# Patient Record
Sex: Female | Born: 1935
Health system: Southern US, Community
[De-identification: ages and names within clinical notes are randomized; demographics above are authoritative.]

## PROBLEM LIST (undated history)

## (undated) DIAGNOSIS — I10 Essential (primary) hypertension: Secondary | ICD-10-CM

## (undated) DIAGNOSIS — I6522 Occlusion and stenosis of left carotid artery: Secondary | ICD-10-CM

## (undated) DIAGNOSIS — N189 Chronic kidney disease, unspecified: Secondary | ICD-10-CM

## (undated) DIAGNOSIS — D649 Anemia, unspecified: Secondary | ICD-10-CM

## (undated) HISTORY — DX: Anemia, unspecified: D64.9

## (undated) HISTORY — PX: JOINT REPLACEMENT: SHX530

## (undated) HISTORY — PX: LUMBAR FUSION: SHX111

## (undated) HISTORY — DX: Chronic kidney disease, unspecified: N18.9

## (undated) HISTORY — PX: THYROID SURGERY: SHX805

---

## 2006-01-27 ENCOUNTER — Inpatient Hospital Stay (HOSPITAL_COMMUNITY): Admission: RE | Admit: 2006-01-27 | Discharge: 2006-02-04 | Payer: Self-pay | Admitting: Neurosurgery

## 2006-01-30 ENCOUNTER — Ambulatory Visit: Payer: Self-pay | Admitting: Physical Medicine & Rehabilitation

## 2016-12-26 ENCOUNTER — Inpatient Hospital Stay (HOSPITAL_COMMUNITY): Payer: Medicare PPO

## 2016-12-26 ENCOUNTER — Inpatient Hospital Stay (HOSPITAL_COMMUNITY)
Admission: AD | Admit: 2016-12-26 | Discharge: 2017-01-02 | DRG: 481 | Disposition: A | Discharge status: Home / Self Care | Payer: Medicare PPO | Attending: Internal Medicine | Admitting: Internal Medicine

## 2016-12-26 ENCOUNTER — Encounter (HOSPITAL_COMMUNITY): Payer: Self-pay | Admitting: *Deleted

## 2016-12-26 DIAGNOSIS — Z823 Family history of stroke: Secondary | ICD-10-CM

## 2016-12-26 DIAGNOSIS — N189 Chronic kidney disease, unspecified: Secondary | ICD-10-CM

## 2016-12-26 DIAGNOSIS — M25552 Pain in left hip: Secondary | ICD-10-CM | POA: Diagnosis present

## 2016-12-26 DIAGNOSIS — D638 Anemia in other chronic diseases classified elsewhere: Secondary | ICD-10-CM | POA: Diagnosis present

## 2016-12-26 DIAGNOSIS — E860 Dehydration: Secondary | ICD-10-CM | POA: Diagnosis present

## 2016-12-26 DIAGNOSIS — Z79899 Other long term (current) drug therapy: Secondary | ICD-10-CM | POA: Diagnosis not present

## 2016-12-26 DIAGNOSIS — N390 Urinary tract infection, site not specified: Secondary | ICD-10-CM | POA: Diagnosis present

## 2016-12-26 DIAGNOSIS — D62 Acute posthemorrhagic anemia: Secondary | ICD-10-CM | POA: Diagnosis present

## 2016-12-26 DIAGNOSIS — R809 Proteinuria, unspecified: Secondary | ICD-10-CM | POA: Diagnosis present

## 2016-12-26 DIAGNOSIS — Z09 Encounter for follow-up examination after completed treatment for conditions other than malignant neoplasm: Secondary | ICD-10-CM

## 2016-12-26 DIAGNOSIS — I129 Hypertensive chronic kidney disease with stage 1 through stage 4 chronic kidney disease, or unspecified chronic kidney disease: Secondary | ICD-10-CM | POA: Diagnosis present

## 2016-12-26 DIAGNOSIS — N179 Acute kidney failure, unspecified: Secondary | ICD-10-CM | POA: Diagnosis present

## 2016-12-26 DIAGNOSIS — W1830XA Fall on same level, unspecified, initial encounter: Secondary | ICD-10-CM | POA: Diagnosis present

## 2016-12-26 DIAGNOSIS — Z8249 Family history of ischemic heart disease and other diseases of the circulatory system: Secondary | ICD-10-CM | POA: Diagnosis not present

## 2016-12-26 DIAGNOSIS — D649 Anemia, unspecified: Secondary | ICD-10-CM | POA: Diagnosis present

## 2016-12-26 DIAGNOSIS — S72142A Displaced intertrochanteric fracture of left femur, initial encounter for closed fracture: Principal | ICD-10-CM | POA: Diagnosis present

## 2016-12-26 DIAGNOSIS — B9689 Other specified bacterial agents as the cause of diseases classified elsewhere: Secondary | ICD-10-CM | POA: Diagnosis present

## 2016-12-26 DIAGNOSIS — B961 Klebsiella pneumoniae [K. pneumoniae] as the cause of diseases classified elsewhere: Secondary | ICD-10-CM | POA: Diagnosis present

## 2016-12-26 DIAGNOSIS — I10 Essential (primary) hypertension: Secondary | ICD-10-CM | POA: Diagnosis not present

## 2016-12-26 DIAGNOSIS — K5900 Constipation, unspecified: Secondary | ICD-10-CM | POA: Diagnosis not present

## 2016-12-26 DIAGNOSIS — N183 Chronic kidney disease, stage 3 (moderate): Secondary | ICD-10-CM | POA: Diagnosis not present

## 2016-12-26 DIAGNOSIS — S72142D Displaced intertrochanteric fracture of left femur, subsequent encounter for closed fracture with routine healing: Secondary | ICD-10-CM | POA: Diagnosis not present

## 2016-12-26 DIAGNOSIS — N170 Acute kidney failure with tubular necrosis: Secondary | ICD-10-CM | POA: Diagnosis not present

## 2016-12-26 DIAGNOSIS — N184 Chronic kidney disease, stage 4 (severe): Secondary | ICD-10-CM | POA: Diagnosis not present

## 2016-12-26 DIAGNOSIS — S72142P Displaced intertrochanteric fracture of left femur, subsequent encounter for closed fracture with malunion: Secondary | ICD-10-CM | POA: Diagnosis not present

## 2016-12-26 HISTORY — DX: Essential (primary) hypertension: I10

## 2016-12-26 LAB — COMPREHENSIVE METABOLIC PANEL
ALT: 19 U/L (ref 14–54)
AST: 23 U/L (ref 15–41)
Albumin: 3.5 g/dL (ref 3.5–5.0)
Alkaline Phosphatase: 67 U/L (ref 38–126)
Anion gap: 10 (ref 5–15)
BILIRUBIN TOTAL: 0.9 mg/dL (ref 0.3–1.2)
BUN: 52 mg/dL — AB (ref 6–20)
CALCIUM: 8.6 mg/dL — AB (ref 8.9–10.3)
CO2: 23 mmol/L (ref 22–32)
CREATININE: 2.31 mg/dL — AB (ref 0.44–1.00)
Chloride: 106 mmol/L (ref 101–111)
GFR calc Af Amer: 21 mL/min — ABNORMAL LOW (ref >60)
GFR, EST NON AFRICAN AMERICAN: 18 mL/min — AB (ref >60)
Glucose, Bld: 103 mg/dL — ABNORMAL HIGH (ref 65–99)
Potassium: 3.5 mmol/L (ref 3.5–5.1)
Sodium: 139 mmol/L (ref 135–145)
TOTAL PROTEIN: 6.2 g/dL — AB (ref 6.5–8.1)

## 2016-12-26 LAB — CBC
HCT: 22.3 % — ABNORMAL LOW (ref 36.0–46.0)
Hemoglobin: 7.7 g/dL — ABNORMAL LOW (ref 12.0–15.0)
MCH: 31.3 pg (ref 26.0–34.0)
MCHC: 34.5 g/dL (ref 30.0–36.0)
MCV: 90.7 fL (ref 78.0–100.0)
PLATELETS: 199 10*3/uL (ref 150–400)
RBC: 2.46 MIL/uL — AB (ref 3.87–5.11)
RDW: 13.6 % (ref 11.5–15.5)
WBC: 9.4 10*3/uL (ref 4.0–10.5)

## 2016-12-26 LAB — URINALYSIS, ROUTINE W REFLEX MICROSCOPIC
Bilirubin Urine: NEGATIVE
Glucose, UA: NEGATIVE mg/dL
Ketones, ur: NEGATIVE mg/dL
Nitrite: NEGATIVE
Protein, ur: >=300 mg/dL — AB
SPECIFIC GRAVITY, URINE: 1.014 (ref 1.005–1.030)
pH: 5 (ref 5.0–8.0)

## 2016-12-26 LAB — IRON AND TIBC
Iron: 31 ug/dL (ref 28–170)
Saturation Ratios: 12 % (ref 10.4–31.8)
TIBC: 252 ug/dL (ref 250–450)
UIBC: 221 ug/dL

## 2016-12-26 LAB — SODIUM, URINE, RANDOM: SODIUM UR: 19 mmol/L

## 2016-12-26 LAB — VITAMIN B12: VITAMIN B 12: 555 pg/mL (ref 180–914)

## 2016-12-26 LAB — MAGNESIUM: Magnesium: 2.1 mg/dL (ref 1.7–2.4)

## 2016-12-26 LAB — CREATININE, URINE, RANDOM: CREATININE, URINE: 133.37 mg/dL

## 2016-12-26 LAB — PROTIME-INR
INR: 1.04
PROTHROMBIN TIME: 13.5 s (ref 11.4–15.2)

## 2016-12-26 LAB — FERRITIN: FERRITIN: 77 ng/mL (ref 11–307)

## 2016-12-26 LAB — FOLATE: Folate: 17.8 ng/mL (ref >5.9)

## 2016-12-26 MED ORDER — SODIUM CHLORIDE 0.9% FLUSH: 3.0000 mL | INTRAVENOUS | Status: DC | PRN

## 2016-12-26 MED ORDER — IRBESARTAN 150 MG PO TABS
300.0000 mg | ORAL_TABLET | Freq: Every day | ORAL | Status: DC
  Administered 2016-12-26: 18:00:00 300 mg via ORAL
  Filled 2016-12-26: qty 2

## 2016-12-26 MED ORDER — SODIUM CHLORIDE 0.9 % IV SOLN: 250.0000 mL | INTRAVENOUS | Status: DC | PRN

## 2016-12-26 MED ORDER — SODIUM CHLORIDE 0.9 % IV SOLN
INTRAVENOUS | Status: DC
  Administered 2016-12-26 – 2016-12-30 (×7): via INTRAVENOUS

## 2016-12-26 MED ORDER — HYDROCODONE-ACETAMINOPHEN 5-325 MG PO TABS
1.0000 | ORAL_TABLET | ORAL | Status: DC | PRN
  Administered 2016-12-26 – 2016-12-30 (×5): 1 via ORAL
  Filled 2016-12-26 (×5): qty 1
  Filled 2016-12-26: qty 2

## 2016-12-26 MED ORDER — AMLODIPINE BESY-BENAZEPRIL HCL 5-20 MG PO CAPS: 1.0000 | ORAL_CAPSULE | Freq: Every day | ORAL | Status: DC

## 2016-12-26 MED ORDER — BENAZEPRIL HCL 10 MG PO TABS
20.0000 mg | ORAL_TABLET | Freq: Every day | ORAL | Status: DC
  Filled 2016-12-26: qty 2

## 2016-12-26 MED ORDER — ONDANSETRON HCL 4 MG/2ML IJ SOLN
4.0000 mg | Freq: Four times a day (QID) | INTRAMUSCULAR | Status: DC | PRN
  Administered 2016-12-28: 4 mg via INTRAVENOUS

## 2016-12-26 MED ORDER — OLMESARTAN MEDOXOMIL-HCTZ 40-25 MG PO TABS: 1.0000 | ORAL_TABLET | Freq: Every day | ORAL | Status: DC

## 2016-12-26 MED ORDER — HYDRALAZINE HCL 20 MG/ML IJ SOLN: 5.0000 mg | Freq: Four times a day (QID) | INTRAMUSCULAR | Status: DC | PRN

## 2016-12-26 MED ORDER — HEPARIN SODIUM (PORCINE) 5000 UNIT/ML IJ SOLN
5000.0000 [IU] | Freq: Three times a day (TID) | INTRAMUSCULAR | Status: DC
  Administered 2016-12-26 – 2016-12-27 (×2): 5000 [IU] via SUBCUTANEOUS
  Filled 2016-12-26 (×2): qty 1

## 2016-12-26 MED ORDER — AMLODIPINE BESYLATE 10 MG PO TABS: 10.0000 mg | ORAL_TABLET | Freq: Every day | ORAL | Status: DC

## 2016-12-26 MED ORDER — HYDROCHLOROTHIAZIDE 25 MG PO TABS
25.0000 mg | ORAL_TABLET | Freq: Every day | ORAL | Status: DC
  Administered 2016-12-26: 25 mg via ORAL
  Filled 2016-12-26: qty 1

## 2016-12-26 MED ORDER — ACETAMINOPHEN 325 MG PO TABS: 650.0000 mg | ORAL_TABLET | Freq: Four times a day (QID) | ORAL | Status: DC | PRN

## 2016-12-26 MED ORDER — SODIUM CHLORIDE 0.9% FLUSH
3.0000 mL | Freq: Two times a day (BID) | INTRAVENOUS | Status: DC
  Administered 2016-12-27 (×2): 3 mL via INTRAVENOUS

## 2016-12-26 MED ORDER — ACETAMINOPHEN 650 MG RE SUPP: 650.0000 mg | Freq: Four times a day (QID) | RECTAL | Status: DC | PRN

## 2016-12-26 MED ORDER — AMLODIPINE BESYLATE 5 MG PO TABS
5.0000 mg | ORAL_TABLET | Freq: Every day | ORAL | Status: DC
  Filled 2016-12-26: qty 1

## 2016-12-26 MED ORDER — AMLODIPINE BESYLATE 10 MG PO TABS
10.0000 mg | ORAL_TABLET | Freq: Every day | ORAL | Status: DC
  Administered 2016-12-27 – 2017-01-02 (×7): 10 mg via ORAL
  Filled 2016-12-26 (×7): qty 1

## 2016-12-26 NOTE — H&P (Signed)
[facility]                                                                                    Patient Demographics  Kathleen Jimenez, is a 83 y.o. female  CSN: [id number]  MRN: [id number]  DOB - [birth year]  Admit Date - 12/26/2016  Outpatient Primary MD for the patient is System, Provider Not In   With History of -  Past Medical History:  Diagnosis Date  . Hypertension       Past Surgical History:  Procedure Laterality Date  . JOINT REPLACEMENT  approx 2012   R orif  . LUMBAR FUSION  approx. 2003  . THYROID SURGERY  approx. 1960   nodule removed    in for   No chief complaint on file.    HPI  Kathleen Jimenez  is a 83 y.o. female, with PMH sig for HTN ,Right hip fracture status post repair in 2012 presenting with few days history of left hip discomfort after a fall. Patient denies any preceding chest pains, palpitations, headache, or dizziness prior to the fall. No nausea vomiting or diarrhea reported. Patient lives in [place] and son drove there to pick her up . Incident occurred last Thursday, around 5 days ago. Patient is very pleasant and conversing with [place] with no pain    Review of Systems    In addition to the HPI above,  No Fever-chills, No Headache, No changes with Vision or hearing, No problems swallowing food or Liquids, No Chest pain, Cough or Shortness of Breath, No Abdominal pain, No Nausea or Vommitting, Bowel movements are regular, No Blood in stool or Urine, No dysuria, No new skin rashes or bruises,  No new weakness, tingling, numbness in any extremity, No recent weight gain or loss, No polyuria, polydypsia or polyphagia, No significant Mental Stressors.  A full 10 point Review of Systems was done, except as stated above, all other Review of Systems were negative.   Social History Social History  Substance Use Topics  . Smoking status: Never Smoker  . Smokeless tobacco: Never Used  . Alcohol use No     Family  History Family History  Problem Relation Age of Onset  . Congestive Heart Failure Mother   . Stroke Father      Prior to Admission medications   Medication Sig Start Date End Date Taking? Authorizing Provider  amLODipine-benazepril (LOTREL) 5-20 MG capsule Take 1 capsule by mouth daily.  09/30/16  Yes [provider]  aspirin 325 MG EC tablet Take 325 mg by mouth every 6 (six) hours as needed for pain.   Yes [provider]  ibuprofen (ADVIL,MOTRIN) 200 MG tablet Take 400 mg by mouth every 6 (six) hours as needed for moderate pain.   Yes [provider]  olmesartan-hydrochlorothiazide (BENICAR HCT) 40-25 MG tablet Take 1 tablet by mouth daily.  09/30/16  Yes [provider]    No Known Allergies  Physical Exam  Vitals  Blood pressure (!) 179/80, pulse 86, temperature 98.2 F (36.8 C), temperature source Oral, resp. rate 16, SpO2 97 %.   1. General Extremely pleasant female in no acute distress  2. Normal affect and insight, Not Suicidal or Homicidal, Awake Alert, Oriented X 3.  3. No F.N deficits, grossly, patient moving all extremities  4. Ears and Eyes appear Normal, Conjunctivae clear, PERRLA.  5. Supple Neck, No JVD, No cervical lymphadenopathy appriciated, No Carotid Bruits.  6. Symmetrical Chest wall movement, Good air movement bilaterally, CTAB.  7. RRR, No Gallops, Rubs or Murmurs, No Parasternal Heave.  8. Positive Bowel Sounds, Abdomen Soft, Non tender, No organomegaly appriciated,No rebound -guarding or rigidity.  9.  No Cyanosis, Normal Skin Turgor, No Skin Rash or Bruise.  10. Good muscle tone,  left leg shorter than right with mild rotation  Data Review  CBC No results for input(s): WBC, HGB, HCT, PLT, MCV, MCH, MCHC, RDW, LYMPHSABS, MONOABS, EOSABS, BASOSABS, BANDABS in the last 168 hours.  Invalid input(s): NEUTRABS,  BANDSABD ------------------------------------------------------------------------------------------------------------------  Chemistries  No results for input(s): NA, K, CL, CO2, GLUCOSE, BUN, CREATININE, CALCIUM, MG, AST, ALT, ALKPHOS, BILITOT in the last 168 hours.  Invalid input(s): GFRCGP ------------------------------------------------------------------------------------------------------------------ CrCl cannot be calculated (No order found.). ------------------------------------------------------------------------------------------------------------------ No results for input(s): TSH, T4TOTAL, T3FREE, THYROIDAB in the last 72 hours.  Invalid input(s): FREET3   Coagulation profile No results for input(s): INR, PROTIME in the last 168 hours. ------------------------------------------------------------------------------------------------------------------- No results for input(s): DDIMER in the last 72 hours. -------------------------------------------------------------------------------------------------------------------  Cardiac Enzymes No results for input(s): CKMB, TROPONINI, MYOGLOBIN in the last 168 hours.  Invalid input(s): CK ------------------------------------------------------------------------------------------------------------------ Invalid input(s): POCBNP   ---------------------------------------------------------------------------------------------------------------  Urinalysis No results found for: COLORURINE, APPEARANCEUR, LABSPEC, [place], GLUCOSEU, HGBUR, BILIRUBINUR, KETONESUR, PROTEINUR, UROBILINOGEN, NITRITE, LEUKOCYTESUR  ----------------------------------------------------------------------------------------------------------------    Assessment & Plan  1. Left hip pain, probably fracture after a fall 2. History of hypertension 3. History of right hip fracture repaired in the past 4. History of thyroid nodule removed  Plan Admit the  patient Lab work including urinalysis Pain control with Norco Hold nonsteroidals Orthopedics consulted/[facility] X-ray of left hip     DVT Prophylaxis Heparin  AM Labs Ordered, also please review Full Orders  Family Communication: Admission, patients condition and plan of care including tests being ordered have been discussed with the patient and son who indicate understanding and agree with the plan and Code Status.  Code Status full  Disposition Plan: Home  Time spent in minutes : 42 minutes  Condition GUARDED   @SIGNATURE @

## 2016-12-27 ENCOUNTER — Inpatient Hospital Stay (HOSPITAL_COMMUNITY): Payer: Medicare PPO

## 2016-12-27 DIAGNOSIS — S72142A Displaced intertrochanteric fracture of left femur, initial encounter for closed fracture: Secondary | ICD-10-CM | POA: Diagnosis present

## 2016-12-27 DIAGNOSIS — E860 Dehydration: Secondary | ICD-10-CM

## 2016-12-27 DIAGNOSIS — D62 Acute posthemorrhagic anemia: Secondary | ICD-10-CM

## 2016-12-27 DIAGNOSIS — S72142D Displaced intertrochanteric fracture of left femur, subsequent encounter for closed fracture with routine healing: Secondary | ICD-10-CM

## 2016-12-27 DIAGNOSIS — N179 Acute kidney failure, unspecified: Secondary | ICD-10-CM

## 2016-12-27 LAB — BASIC METABOLIC PANEL
ANION GAP: 9 (ref 5–15)
BUN: 48 mg/dL — ABNORMAL HIGH (ref 6–20)
CALCIUM: 8.2 mg/dL — AB (ref 8.9–10.3)
CO2: 22 mmol/L (ref 22–32)
CREATININE: 2.22 mg/dL — AB (ref 0.44–1.00)
Chloride: 109 mmol/L (ref 101–111)
GFR calc Af Amer: 22 mL/min — ABNORMAL LOW (ref >60)
GFR, EST NON AFRICAN AMERICAN: 19 mL/min — AB (ref >60)
Glucose, Bld: 95 mg/dL (ref 65–99)
Potassium: 3.6 mmol/L (ref 3.5–5.1)
SODIUM: 140 mmol/L (ref 135–145)

## 2016-12-27 LAB — CBC WITH DIFFERENTIAL/PLATELET
BASOS PCT: 1 %
Basophils Absolute: 0.1 10*3/uL (ref 0.0–0.1)
EOS ABS: 0.8 10*3/uL — AB (ref 0.0–0.7)
Eosinophils Relative: 11 %
HCT: 20.1 % — ABNORMAL LOW (ref 36.0–46.0)
HEMOGLOBIN: 6.7 g/dL — AB (ref 12.0–15.0)
LYMPHS ABS: 1.2 10*3/uL (ref 0.7–4.0)
Lymphocytes Relative: 16 %
MCH: 30.7 pg (ref 26.0–34.0)
MCHC: 33.3 g/dL (ref 30.0–36.0)
MCV: 92.2 fL (ref 78.0–100.0)
MONOS PCT: 11 %
Monocytes Absolute: 0.8 10*3/uL (ref 0.1–1.0)
NEUTROS ABS: 4.3 10*3/uL (ref 1.7–7.7)
NEUTROS PCT: 61 %
PLATELETS: 184 10*3/uL (ref 150–400)
RBC: 2.18 MIL/uL — AB (ref 3.87–5.11)
RDW: 13.8 % (ref 11.5–15.5)
WBC: 7.1 10*3/uL (ref 4.0–10.5)

## 2016-12-27 LAB — SURGICAL PCR SCREEN
MRSA, PCR: NEGATIVE
Staphylococcus aureus: NEGATIVE

## 2016-12-27 LAB — ABO/RH: ABO/RH(D): A POS

## 2016-12-27 LAB — PREPARE RBC (CROSSMATCH)

## 2016-12-27 LAB — HEMOGLOBIN AND HEMATOCRIT, BLOOD
HEMATOCRIT: 31 % — AB (ref 36.0–46.0)
HEMOGLOBIN: 10.5 g/dL — AB (ref 12.0–15.0)

## 2016-12-27 MED ORDER — CARVEDILOL 3.125 MG PO TABS
3.1250 mg | ORAL_TABLET | Freq: Two times a day (BID) | ORAL | Status: DC
  Administered 2016-12-27 – 2016-12-31 (×7): 3.125 mg via ORAL
  Filled 2016-12-27 (×8): qty 1

## 2016-12-27 MED ORDER — FUROSEMIDE 10 MG/ML IJ SOLN
20.0000 mg | Freq: Once | INTRAMUSCULAR | Status: AC
  Administered 2016-12-27: 20 mg via INTRAVENOUS
  Filled 2016-12-27: qty 2

## 2016-12-27 MED ORDER — ACETAMINOPHEN 325 MG PO TABS
650.0000 mg | ORAL_TABLET | Freq: Once | ORAL | Status: AC
  Administered 2016-12-27: 650 mg via ORAL
  Filled 2016-12-27: qty 2

## 2016-12-27 MED ORDER — DEXTROSE 5 % IV SOLN
1.0000 g | INTRAVENOUS | Status: DC
  Administered 2016-12-27 – 2016-12-29 (×3): 1 g via INTRAVENOUS
  Filled 2016-12-27 (×4): qty 10

## 2016-12-27 MED ORDER — DIPHENHYDRAMINE HCL 25 MG PO CAPS
25.0000 mg | ORAL_CAPSULE | Freq: Once | ORAL | Status: AC
  Administered 2016-12-27: 12:00:00 25 mg via ORAL
  Filled 2016-12-27: qty 1

## 2016-12-27 MED ORDER — SODIUM CHLORIDE 0.9 % IV SOLN
Freq: Once | INTRAVENOUS | Status: AC
  Administered 2016-12-27: 13:00:00 via INTRAVENOUS

## 2016-12-27 MED ORDER — PANTOPRAZOLE SODIUM 40 MG PO TBEC
40.0000 mg | DELAYED_RELEASE_TABLET | Freq: Every day | ORAL | Status: DC
  Administered 2016-12-27 – 2017-01-02 (×7): 40 mg via ORAL
  Filled 2016-12-27 (×7): qty 1

## 2016-12-27 MED ORDER — SENNOSIDES-DOCUSATE SODIUM 8.6-50 MG PO TABS
1.0000 | ORAL_TABLET | Freq: Two times a day (BID) | ORAL | Status: DC
  Administered 2016-12-27 – 2017-01-01 (×9): 1 via ORAL
  Filled 2016-12-27 (×10): qty 1

## 2016-12-27 NOTE — Progress Notes (Addendum)
PROGRESS NOTE    Kathleen Jimenez  OBS:[id number] DOB: 10/22/1931 DOA: 12/26/2016 PCP: System, Provider Not In    Brief Narrative:  Patient is a pleasant 83 year old female with history of hypertension who sustained a mechanical fall leading to her left hip fracture. Patient admitted and seen in consultation by orthopedics. Patient for probable ORIF tomorrow 12/28/2016. On admission patient noted to have a elevated renal function concern for acute kidney disease versus chronic kidney disease, anemic with a hemoglobin of 7.7. Patient to be transfused 2 units packed red blood cells.   Assessment & Plan:   Principal Problem:   Closed intertrochanteric fracture of left hip (HCC) Active Problems:   Hip pain, acute, left   ARF (acute renal failure) (HCC)   HTN (hypertension)   Anemia   Dehydration   Acute blood loss anemia  #1 closed intertrochanteric left hip fracture Secondary to mechanical fall. Patient denies any syncope, no lightheadedness, no dizziness, no chest pain, no presyncopal episodes. Patient noted to have significant ecchymosis around the left hip likely secondary to bleeding. Pain currently controlled. Patient has been seen in consultation by orthopedics who were planning for ORIF of the left hip tomorrow 12/28/2016. Per orthopedics.  #2 acute blood loss anemia Patient noted to be anemic on admission with a hemoglobin of 7.7. Hemoglobin currently is 6.7. Patient noted with a large ecchymotic area around the left hip around fracture site which is likely etiology of patient's anemia. Patient denies any overt bleeding. Check FOBT. Place on a PPI. Anemia panel consistent with anemia of chronic disease. Will type and cross and transfuse 2 units packed red blood cells. Follow H&H.  #3 acute renal failure versus chronic kidney disease Baseline unknown. Patient noted on admission to have a creatinine of 2.31. Patient noted to be on ACE inhibitor, ARB and NSAIDs. Fractional excretion of  sodium pending. Urinalysis with some proteinuria. Renal ultrasound with increased renal echogenicity consistent with changes of medical renal disease. Negative for hydronephrosis. Left renal cysts measuring up to 2.3 and 1.6 cm. Continue gentle hydration. Avoid nephrotoxins. Follow.  #4 dehydration IV fluids.  #5 hypertension Due to patient's renal function patient's ARB, diuretics, ACE inhibitor have been discontinued. Patient placed on Norvasc 10 mg daily. Coreg 3.125 mg twice a day added to regimen. Check vitals every 6 hours. Follow and titrate as needed.  #6 probable UTI Urinalysis consistent with a UTI. Urine cultures pending. Place empirically on IV Rocephin.    DVT prophylaxis: Per orthopedics. Lovenox discontinued secondary to anemia and ecchymosis noted on left hip. Code Status: Full Family Communication: Updated patient. No family at bedside. Disposition Plan:  to be determined postoperatively. Likely skilled nursing facility versus home with home health.   Consultants:   Orthopedics: Dr. Wynelle Link 12/27/2016  Procedures:   Plain films of the left hip 12/26/2016  Chest x-ray 12/26/2016   renal ultrasound 12/27/2016  Antimicrobials:   IV Rocephin 12/27/2016   Subjective: Patient denies any chest pain. No shortness of breath. No bloody bowel movements. Patient with some pain on the left hip.  Objective: Vitals:   12/26/16 2100 12/26/16 2300 12/27/16 0636 12/27/16 0818  BP:  (!) 138/43 (!) 156/61 (!) 160/60  Pulse:  71 66   Resp:  18 16   Temp:  98.5 F (36.9 C) 97.9 F (36.6 C)   TempSrc:  Oral Oral   SpO2:  97% 98%   Weight: 55 kg (121 lb 4.1 oz)     Height: 5\' 4"  (1.626 m)  Intake/Output Summary (Last 24 hours) at 12/27/16 1111 Last data filed at 12/27/16 1035  Gross per 24 hour  Intake          1166.67 ml  Output              900 ml  Net           266.67 ml   Filed Weights   12/26/16 2100  Weight: 55 kg (121 lb 4.1 oz)     Examination:  General exam: Appears calm and comfortable  Respiratory system: Clear to auscultation. Respiratory effort normal. Cardiovascular system: S1 & S2 heard, RRR. No JVD, murmurs, rubs, gallops or clicks. No pedal edema. Gastrointestinal system: Abdomen is nondistended, soft and nontender. No organomegaly or masses felt. Normal bowel sounds heard. Central nervous system: Alert and oriented. No focal neurological deficits. Extremities: Left lower extremity with some swelling around the hip, ecchymosis on lateral left hip extending posteriorly as well as on the inner thigh.  Skin: No rashes, lesions or ulcers Psychiatry: Judgement and insight appear normal. Mood & affect appropriate.     Data Reviewed: I have personally reviewed following labs and imaging studies  CBC:  Recent Labs Lab 12/26/16 1749 12/27/16 0602  WBC 9.4 7.1  NEUTROABS  --  4.3  HGB 7.7* 6.7*  HCT 22.3* 20.1*  MCV 90.7 92.2  PLT 199 509   Basic Metabolic Panel:  Recent Labs Lab 12/26/16 1749 12/26/16 1942 12/27/16 0602  NA 139  --  140  K 3.5  --  3.6  CL 106  --  109  CO2 23  --  22  GLUCOSE 103*  --  95  BUN 52*  --  48*  CREATININE 2.31*  --  2.22*  CALCIUM 8.6*  --  8.2*  MG  --  2.1  --    GFR: Estimated Creatinine Clearance: 16.6 mL/min (A) (by C-G formula based on SCr of 2.22 mg/dL (H)). Liver Function Tests:  Recent Labs Lab 12/26/16 1749  AST 23  ALT 19  ALKPHOS 67  BILITOT 0.9  PROT 6.2*  ALBUMIN 3.5   No results for input(s): LIPASE, AMYLASE in the last 168 hours. No results for input(s): AMMONIA in the last 168 hours. Coagulation Profile:  Recent Labs Lab 12/26/16 1749  INR 1.04   Cardiac Enzymes: No results for input(s): CKTOTAL, CKMB, CKMBINDEX, TROPONINI in the last 168 hours. BNP (last 3 results) No results for input(s): PROBNP in the last 8760 hours. HbA1C: No results for input(s): HGBA1C in the last 72 hours. CBG: No results for input(s): GLUCAP  in the last 168 hours. Lipid Profile: No results for input(s): CHOL, HDL, LDLCALC, TRIG, CHOLHDL, LDLDIRECT in the last 72 hours. Thyroid Function Tests: No results for input(s): TSH, T4TOTAL, FREET4, T3FREE, THYROIDAB in the last 72 hours. Anemia Panel:  Recent Labs  12/26/16 1942  VITAMINB12 555  FOLATE 17.8  FERRITIN 77  TIBC 252  IRON 31   Sepsis Labs: No results for input(s): PROCALCITON, LATICACIDVEN in the last 168 hours.  No results found for this or any previous visit (from the past 240 hour(s)).       Radiology Studies: Portable Chest 1 View  Result Date: 12/26/2016 CLINICAL DATA:  Recent falls with weakness. EXAM: PORTABLE CHEST 1 VIEW COMPARISON:  01/24/2006. FINDINGS: 1804 hours. The lungs are clear without focal pneumonia, edema, pneumothorax or pleural effusion. Cardiopericardial silhouette is at upper limits of normal for size. The visualized bony structures of the thorax  are intact. IMPRESSION: No active disease. Electronically Signed   By: Misty [place] M.D.   On: 12/26/2016 20:19   Dg Hip Unilat With Pelvis 2-3 Views Left  Result Date: 12/26/2016 CLINICAL DATA:  Left hip pain, post multiple falls. EXAM: DG HIP (WITH OR WITHOUT PELVIS) 2-3V LEFT COMPARISON:  None. FINDINGS: There is a comminuted impacted intertrochanteric fracture of the left femur with superior migration of the distal fracture fragment and abnormal rotation of the femoral head which is located within the acetabulum. There is an associated soft tissue swelling. Prior intramedullary rod fixation of the right femur. Vascular calcifications noted. IMPRESSION: Comminuted impacted intertrochanteric fracture of the left femur. Electronically Signed   By: Fidela Salisbury M.D.   On: 12/26/2016 20:05        Scheduled Meds: . acetaminophen  650 mg Oral Once  . amLODipine  10 mg Oral Daily  . carvedilol  3.125 mg Oral BID WC  . diphenhydrAMINE  25 mg Oral Once  . furosemide  20 mg Intravenous  Once  . pantoprazole  40 mg Oral Q0600  . sodium chloride flush  3 mL Intravenous Q12H   Continuous Infusions: . sodium chloride    . sodium chloride 100 mL/hr at 12/27/16 0818  . sodium chloride       LOS: 1 day    Time spent: 35 minutes    THOMPSON,DANIEL, MD [facility] Pager [phone]  If 7PM-7AM, please contact night-coverage [email or web address] Password [email or web address] 12/27/2016, 11:11 AM

## 2016-12-27 NOTE — Consult Note (Signed)
Reason for Consult: left hip pain following fall Referring Physician: Merton Border, MD  Kathleen Jimenez is an 83 y.o. female.  HPI: patient is an 83 year old female who sustained injury during a fall this past Thursday.  She was walking back into her kitchen when she fell and apparently landed on her left hip.  She denies any lightheadedness, dizziness, syncopal, presyncopal episodes associated with the fall. She does not recall tripping or stumbling either.  She thought her hip was just bruised and tried to tough it out.  She lives in [place] and her son lives and practices here in [place].  She was brought to [facility] yesterday by Dr. Harlow Asa and brought in for evaluation and xrays.  She was found to have a displaced intertroch left hip fracture.  She was placed at bedrest and admitted by the medical service.  Her HGB on admission was noted to be low at 7.7.  She is 6.7 today and will require blood.  The type of fracture she sustained is extracapsular so bleeding from the fracture will occur and be deposited into the thigh which corresponds to the amount of bruising that was found on exam today.  She will receive two units today and will most likely require further blood during the perioperative period.  This is briefly discussed with the patient and she is understands and is okay with transfusions as necessary.  Past Medical History:  Diagnosis Date  . Hypertension     Past Surgical History:  Procedure Laterality Date  . JOINT REPLACEMENT  approx 2012   R orif  . LUMBAR FUSION  approx. 2003  . THYROID SURGERY  approx. 1960   nodule removed    Family History  Problem Relation Age of Onset  . Congestive Heart Failure Mother   . Stroke Father     Social History:  reports that she has never smoked. She has never used smokeless tobacco. She reports that she does not drink alcohol or use drugs.  Allergies: No Known Allergies  Home Medications:  Lotrel 5-20 mg daily Aspirin 325 mg  prn Ibuprofen 200 mg prn Benicar 40-25 daily  Results for orders placed or performed during the hospital encounter of 12/26/16 (from the past 48 hour(s))  Urinalysis, Routine w reflex microscopic     Status: Abnormal   Collection Time: 12/26/16  5:20 PM  Result Value Ref Range   Color, Urine YELLOW YELLOW   APPearance CLOUDY (A) CLEAR   Specific Gravity, Urine 1.014 1.005 - 1.030   pH 5.0 5.0 - 8.0   Glucose, UA NEGATIVE NEGATIVE mg/dL   Hgb urine dipstick MODERATE (A) NEGATIVE   Bilirubin Urine NEGATIVE NEGATIVE   Ketones, ur NEGATIVE NEGATIVE mg/dL   Protein, ur >=300 (A) NEGATIVE mg/dL   Nitrite NEGATIVE NEGATIVE   Leukocytes, UA LARGE (A) NEGATIVE   RBC / HPF 6-30 0 - 5 RBC/hpf   WBC, UA TOO NUMEROUS TO COUNT 0 - 5 WBC/hpf   Bacteria, UA MANY (A) NONE SEEN   Squamous Epithelial / LPF 0-5 (A) NONE SEEN   WBC Clumps PRESENT   CBC     Status: Abnormal   Collection Time: 12/26/16  5:49 PM  Result Value Ref Range   WBC 9.4 4.0 - 10.5 K/uL   RBC 2.46 (L) 3.87 - 5.11 MIL/uL   Hemoglobin 7.7 (L) 12.0 - 15.0 g/dL   HCT 22.3 (L) 36.0 - 46.0 %   MCV 90.7 78.0 - 100.0 fL  MCH 31.3 26.0 - 34.0 pg   MCHC 34.5 30.0 - 36.0 g/dL   RDW 13.6 11.5 - 15.5 %   Platelets 199 150 - 400 K/uL  Comprehensive metabolic panel     Status: Abnormal   Collection Time: 12/26/16  5:49 PM  Result Value Ref Range   Sodium 139 135 - 145 mmol/L   Potassium 3.5 3.5 - 5.1 mmol/L   Chloride 106 101 - 111 mmol/L   CO2 23 22 - 32 mmol/L   Glucose, Bld 103 (H) 65 - 99 mg/dL   BUN 52 (H) 6 - 20 mg/dL   Creatinine, Ser 2.31 (H) 0.44 - 1.00 mg/dL   Calcium 8.6 (L) 8.9 - 10.3 mg/dL   Total Protein 6.2 (L) 6.5 - 8.1 g/dL   Albumin 3.5 3.5 - 5.0 g/dL   AST 23 15 - 41 U/L   ALT 19 14 - 54 U/L   Alkaline Phosphatase 67 38 - 126 U/L   Total Bilirubin 0.9 0.3 - 1.2 mg/dL   GFR calc non Af Amer 18 (L) >60 mL/min   GFR calc Af Amer 21 (L) >60 mL/min    Comment: (NOTE) The eGFR has been calculated using the CKD  EPI equation. This calculation has not been validated in all clinical situations. eGFR's persistently <60 mL/min signify possible Chronic Kidney Disease.    Anion gap 10 5 - 15  Protime-INR     Status: None   Collection Time: 12/26/16  5:49 PM  Result Value Ref Range   Prothrombin Time 13.5 11.4 - 15.2 seconds   INR 1.04   Sodium, urine, random     Status: None   Collection Time: 12/26/16  7:02 PM  Result Value Ref Range   Sodium, Ur 19 mmol/L    Comment: Performed at [facility] [address], [place], [place] [address]  Creatinine, urine, random     Status: None   Collection Time: 12/26/16  7:02 PM  Result Value Ref Range   Creatinine, Urine 133.37 mg/dL    Comment: Performed at [facility] [address] [place], [place] [address]  Vitamin B12     Status: None   Collection Time: 12/26/16  7:42 PM  Result Value Ref Range   Vitamin B-12 555 180 - 914 pg/mL    Comment: (NOTE) This assay is not validated for testing neonatal or myeloproliferative syndrome specimens for Vitamin B12 levels. Performed at [facility], [place] [address], [place], [place] [address]   Folate     Status: None   Collection Time: 12/26/16  7:42 PM  Result Value Ref Range   Folate 17.8 >5.9 ng/mL    Comment: Performed at [facility], [place] [address] [place], [place] [address]  Iron and TIBC     Status: None   Collection Time: 12/26/16  7:42 PM  Result Value Ref Range   Iron 31 28 - 170 ug/dL   TIBC 252 250 - 450 ug/dL   Saturation Ratios 12 10.4 - 31.8 %   UIBC 221 ug/dL    Comment: Performed at [facility], [place] [address] [place], [place] [address]  Ferritin     Status: None   Collection Time: 12/26/16  7:42 PM  Result Value Ref Range   Ferritin 77 11 - 307 ng/mL    Comment: Performed at [facility] [address] [place],  [address]  Magnesium     Status:  None   Collection Time: 12/26/16  7:42 PM  Result Value Ref Range   Magnesium 2.1 1.7  - 2.4 mg/dL  CBC with Differential/Platelet     Status: Abnormal   Collection Time: 12/27/16  6:02 AM  Result Value Ref Range   WBC 7.1 4.0 - 10.5 K/uL   RBC 2.18 (L) 3.87 - 5.11 MIL/uL   Hemoglobin 6.7 (LL) 12.0 - 15.0 g/dL    Comment: CRITICAL RESULT CALLED TO, READ BACK BY AND VERIFIED WITH: Mateo Flow, D. RN _0  ON 10.9.18 BY NMCCOY    HCT 20.1 (L) 36.0 - 46.0 %   MCV 92.2 78.0 - 100.0 fL   MCH 30.7 26.0 - 34.0 pg   MCHC 33.3 30.0 - 36.0 g/dL   RDW 13.8 11.5 - 15.5 %   Platelets 184 150 - 400 K/uL   Neutrophils Relative % 61 %   Neutro Abs 4.3 1.7 - 7.7 K/uL   Lymphocytes Relative 16 %   Lymphs Abs 1.2 0.7 - 4.0 K/uL   Monocytes Relative 11 %   Monocytes Absolute 0.8 0.1 - 1.0 K/uL   Eosinophils Relative 11 %   Eosinophils Absolute 0.8 (H) 0.0 - 0.7 K/uL   Basophils Relative 1 %   Basophils Absolute 0.1 0.0 - 0.1 K/uL  Basic metabolic panel     Status: Abnormal   Collection Time: 12/27/16  6:02 AM  Result Value Ref Range   Sodium 140 135 - 145 mmol/L   Potassium 3.6 3.5 - 5.1 mmol/L   Chloride 109 101 - 111 mmol/L   CO2 22 22 - 32 mmol/L   Glucose, Bld 95 65 - 99 mg/dL   BUN 48 (H) 6 - 20 mg/dL   Creatinine, Ser 2.22 (H) 0.44 - 1.00 mg/dL   Calcium 8.2 (L) 8.9 - 10.3 mg/dL   GFR calc non Af Amer 19 (L) >60 mL/min   GFR calc Af Amer 22 (L) >60 mL/min    Comment: (NOTE) The eGFR has been calculated using the CKD EPI equation. This calculation has not been validated in all clinical situations. eGFR's persistently <60 mL/min signify possible Chronic Kidney Disease.    Anion gap 9 5 - 15    Portable Chest 1 View  Result Date: 12/26/2016 CLINICAL DATA:  Recent falls with weakness. EXAM: PORTABLE CHEST 1 VIEW COMPARISON:  01/24/2006. FINDINGS: 1804 hours. The lungs are clear without focal pneumonia, edema, pneumothorax or pleural effusion. Cardiopericardial silhouette is at upper limits of normal for size. The visualized bony structures of the thorax are intact.  IMPRESSION: No active disease. Electronically Signed   By: Misty [place] M.D.   On: 12/26/2016 20:19   Dg Hip Unilat With Pelvis 2-3 Views Left  Result Date: 12/26/2016 CLINICAL DATA:  Left hip pain, post multiple falls. EXAM: DG HIP (WITH OR WITHOUT PELVIS) 2-3V LEFT COMPARISON:  None. FINDINGS: There is a comminuted impacted intertrochanteric fracture of the left femur with superior migration of the distal fracture fragment and abnormal rotation of the femoral head which is located within the acetabulum. There is an associated soft tissue swelling. Prior intramedullary rod fixation of the right femur. Vascular calcifications noted. IMPRESSION: Comminuted impacted intertrochanteric fracture of the left femur. Electronically Signed   By: Fidela Salisbury M.D.   On: 12/26/2016 20:05    Review of Systems  Constitutional: Negative.   HENT: Negative.   Respiratory: Negative.   Cardiovascular: Negative.   Gastrointestinal: Negative.   Musculoskeletal: Positive for joint pain (left hip pain).  Blood pressure (!) 160/60, pulse 66, temperature 97.9 F (36.6 C), temperature source Oral, resp. rate 16, height 5' 4" (1.626 m), weight 55 kg (121 lb 4.1 oz), SpO2 98 %. Physical Exam  Cardiovascular: Intact distal pulses.   Musculoskeletal:       Left hip: She exhibits swelling.  Skin: Ecchymosis (noted on lateral left hip in troch region and extending posteriorly,  also noted on inner thigh toward groin region) noted.  Motor function intact to the left lower leg - moving foot and toes well on exam. Pain on hip roll.  Assessment/Plan: Left Intertroch hip fracture, angulated, displaced, closed  Plan:  Patient seen by Dr. Wynelle Link and dicussed surgical intervention. Plan will be to undergo surgery tomorrow afternoon.  Blood today and recheck labs.  Patient may need further blood even after today and during the perioperative period. Bedrest. Regular diet today. Clear liquids in the morning and then  NPO after breakfast tomorrow. Consent - ORIF left hip  ALEXZANDREW PERKINS 12/27/2016, 9:57 AM

## 2016-12-27 NOTE — Care Management Note (Signed)
Case Management Note  Patient Details  Name: Kathleen Jimenez MRN: [id number] Date of Birth: 12-26-1931  Subjective/Objective: 83 y/o f admitted w/L hip fx. From home w/spouse. For ORIF L hip in am.                   Action/Plan:d/c plan SNF.   Expected Discharge Date:   (unknown)               Expected Discharge Plan:  Skilled Nursing Facility  In-House Referral:  Clinical Social Work  Discharge planning Services  CM Consult  Post Acute Care Choice:    Choice offered to:     DME Arranged:    DME Agency:     HH Arranged:    [place] Agency:     Status of Service:  In process, will continue to follow  If discussed at Long Length of Stay Meetings, dates discussed:    Additional Comments:  Dessa Phi, RN 12/27/2016, 5:45 PM

## 2016-12-27 NOTE — Progress Notes (Signed)
Physical Therapy Discharge Patient Details Name: Kathleen Jimenez MRN: [id number] DOB: 1931-04-20 Today's Date: 12/27/2016 Time:  -     Patient discharged from PT services secondary to surgery - will need to re-order PT to resume therapy services.    GP     Marcelino Freestone PT [phone]  12/27/2016, 7:17 AM

## 2016-12-28 ENCOUNTER — Inpatient Hospital Stay (HOSPITAL_COMMUNITY): Payer: Medicare PPO

## 2016-12-28 ENCOUNTER — Encounter (HOSPITAL_COMMUNITY): Payer: Self-pay | Admitting: Certified Registered"

## 2016-12-28 ENCOUNTER — Inpatient Hospital Stay (HOSPITAL_COMMUNITY): Payer: Medicare PPO | Admitting: Certified Registered"

## 2016-12-28 ENCOUNTER — Encounter (HOSPITAL_COMMUNITY)
Admission: AD | Disposition: A | Discharge status: Home / Self Care | Payer: Self-pay | Source: Home / Self Care | Attending: Internal Medicine

## 2016-12-28 DIAGNOSIS — N189 Chronic kidney disease, unspecified: Secondary | ICD-10-CM

## 2016-12-28 HISTORY — PX: FEMUR IM NAIL: SHX1597

## 2016-12-28 LAB — TYPE AND SCREEN
ABO/RH(D): A POS
Antibody Screen: NEGATIVE
UNIT DIVISION: 0
Unit division: 0

## 2016-12-28 LAB — BASIC METABOLIC PANEL
Anion gap: 9 (ref 5–15)
BUN: 43 mg/dL — ABNORMAL HIGH (ref 6–20)
CALCIUM: 8.5 mg/dL — AB (ref 8.9–10.3)
CO2: 23 mmol/L (ref 22–32)
CREATININE: 2.24 mg/dL — AB (ref 0.44–1.00)
Chloride: 109 mmol/L (ref 101–111)
GFR calc non Af Amer: 19 mL/min — ABNORMAL LOW (ref >60)
GFR, EST AFRICAN AMERICAN: 22 mL/min — AB (ref >60)
GLUCOSE: 103 mg/dL — AB (ref 65–99)
Potassium: 3.9 mmol/L (ref 3.5–5.1)
Sodium: 141 mmol/L (ref 135–145)

## 2016-12-28 LAB — PROTEIN / CREATININE RATIO, URINE
Creatinine, Urine: 40.03 mg/dL
PROTEIN CREATININE RATIO: 1.95 mg/mg{creat} — AB (ref 0.00–0.15)
Total Protein, Urine: 78 mg/dL

## 2016-12-28 LAB — CBC WITH DIFFERENTIAL/PLATELET
BASOS PCT: 1 %
Basophils Absolute: 0 10*3/uL (ref 0.0–0.1)
Eosinophils Absolute: 0.8 10*3/uL — ABNORMAL HIGH (ref 0.0–0.7)
Eosinophils Relative: 10 %
HEMATOCRIT: 29.5 % — AB (ref 36.0–46.0)
Hemoglobin: 10 g/dL — ABNORMAL LOW (ref 12.0–15.0)
Lymphocytes Relative: 11 %
Lymphs Abs: 0.9 10*3/uL (ref 0.7–4.0)
MCH: 30.5 pg (ref 26.0–34.0)
MCHC: 33.9 g/dL (ref 30.0–36.0)
MCV: 89.9 fL (ref 78.0–100.0)
Monocytes Absolute: 0.8 10*3/uL (ref 0.1–1.0)
Monocytes Relative: 10 %
NEUTROS ABS: 5.5 10*3/uL (ref 1.7–7.7)
Neutrophils Relative %: 68 %
Platelets: 183 10*3/uL (ref 150–400)
RBC: 3.28 MIL/uL — ABNORMAL LOW (ref 3.87–5.11)
RDW: 14.1 % (ref 11.5–15.5)
WBC: 8.1 10*3/uL (ref 4.0–10.5)

## 2016-12-28 LAB — BPAM RBC
Blood Product Expiration Date: 201810232359
Blood Product Expiration Date: 201810232359
ISSUE DATE / TIME: 201810091640
ISSUE DATE / TIME: 201810091300
UNIT TYPE AND RH: 6200
UNIT TYPE AND RH: 6200

## 2016-12-28 SURGERY — INSERTION, INTRAMEDULLARY ROD, FEMUR
Anesthesia: General | Site: Hip | Laterality: Left

## 2016-12-28 MED ORDER — METOCLOPRAMIDE HCL 5 MG/ML IJ SOLN: 5.0000 mg | Freq: Three times a day (TID) | INTRAMUSCULAR | Status: DC | PRN

## 2016-12-28 MED ORDER — FENTANYL CITRATE (PF) 100 MCG/2ML IJ SOLN
INTRAMUSCULAR | Status: AC
  Filled 2016-12-28: qty 2

## 2016-12-28 MED ORDER — FENTANYL CITRATE (PF) 100 MCG/2ML IJ SOLN
INTRAMUSCULAR | Status: DC | PRN
  Administered 2016-12-28 (×4): 50 ug via INTRAVENOUS

## 2016-12-28 MED ORDER — DEXAMETHASONE SODIUM PHOSPHATE 10 MG/ML IJ SOLN
INTRAMUSCULAR | Status: DC | PRN
  Administered 2016-12-28: 10 mg via INTRAVENOUS

## 2016-12-28 MED ORDER — LIDOCAINE 2% (20 MG/ML) 5 ML SYRINGE
INTRAMUSCULAR | Status: AC
  Filled 2016-12-28: qty 5

## 2016-12-28 MED ORDER — LIDOCAINE 2% (20 MG/ML) 5 ML SYRINGE
INTRAMUSCULAR | Status: DC | PRN
  Administered 2016-12-28: 80 mg via INTRAVENOUS

## 2016-12-28 MED ORDER — CEFAZOLIN SODIUM-DEXTROSE 2-4 GM/100ML-% IV SOLN
INTRAVENOUS | Status: AC
  Filled 2016-12-28: qty 100

## 2016-12-28 MED ORDER — LABETALOL HCL 5 MG/ML IV SOLN
INTRAVENOUS | Status: DC | PRN
  Administered 2016-12-28 (×2): 2.5 mg via INTRAVENOUS
  Administered 2016-12-28: 7.5 mg via INTRAVENOUS
  Administered 2016-12-28: 2.5 mg via INTRAVENOUS
  Administered 2016-12-28: 5 mg via INTRAVENOUS

## 2016-12-28 MED ORDER — ACETAMINOPHEN 325 MG PO TABS: 650.0000 mg | ORAL_TABLET | Freq: Four times a day (QID) | ORAL | Status: DC | PRN

## 2016-12-28 MED ORDER — LABETALOL HCL 5 MG/ML IV SOLN
5.0000 mg | Freq: Once | INTRAVENOUS | Status: AC
  Administered 2016-12-28: 17:00:00 5 mg via INTRAVENOUS
  Filled 2016-12-28: qty 4

## 2016-12-28 MED ORDER — CEFAZOLIN SODIUM-DEXTROSE 2-3 GM-% IV SOLR
INTRAVENOUS | Status: DC | PRN
  Administered 2016-12-28: 2 g via INTRAVENOUS

## 2016-12-28 MED ORDER — HYDRALAZINE HCL 25 MG PO TABS
25.0000 mg | ORAL_TABLET | Freq: Three times a day (TID) | ORAL | Status: DC
  Administered 2016-12-29 – 2016-12-31 (×6): 25 mg via ORAL
  Filled 2016-12-28 (×7): qty 1

## 2016-12-28 MED ORDER — DEXAMETHASONE SODIUM PHOSPHATE 10 MG/ML IJ SOLN
INTRAMUSCULAR | Status: AC
  Filled 2016-12-28: qty 1

## 2016-12-28 MED ORDER — ACETAMINOPHEN 10 MG/ML IV SOLN
INTRAVENOUS | Status: DC | PRN
  Administered 2016-12-28: 1000 mg via INTRAVENOUS

## 2016-12-28 MED ORDER — ACETAMINOPHEN 10 MG/ML IV SOLN
INTRAVENOUS | Status: AC
  Filled 2016-12-28: qty 100

## 2016-12-28 MED ORDER — FENTANYL CITRATE (PF) 100 MCG/2ML IJ SOLN
25.0000 ug | INTRAMUSCULAR | Status: DC | PRN
  Administered 2016-12-28: 25 ug via INTRAVENOUS

## 2016-12-28 MED ORDER — MEPERIDINE HCL 50 MG/ML IJ SOLN
6.2500 mg | INTRAMUSCULAR | Status: DC | PRN
Start: 2016-12-28 — End: 2016-12-28

## 2016-12-28 MED ORDER — METOCLOPRAMIDE HCL 5 MG/ML IJ SOLN: 10.0000 mg | Freq: Once | INTRAMUSCULAR | Status: DC | PRN

## 2016-12-28 MED ORDER — HYDRALAZINE HCL 25 MG PO TABS
25.0000 mg | ORAL_TABLET | Freq: Once | ORAL | Status: AC
  Administered 2016-12-28: 15:00:00 25 mg via ORAL
  Filled 2016-12-28: qty 1

## 2016-12-28 MED ORDER — PROPOFOL 10 MG/ML IV BOLUS
INTRAVENOUS | Status: AC
  Filled 2016-12-28: qty 20

## 2016-12-28 MED ORDER — MORPHINE SULFATE (PF) 4 MG/ML IV SOLN: 0.5000 mg | INTRAVENOUS | Status: DC | PRN

## 2016-12-28 MED ORDER — APIXABAN 2.5 MG PO TABS
2.5000 mg | ORAL_TABLET | Freq: Two times a day (BID) | ORAL | Status: DC
  Administered 2016-12-29 – 2016-12-30 (×3): 2.5 mg via ORAL
  Filled 2016-12-28 (×3): qty 1

## 2016-12-28 MED ORDER — MENTHOL 3 MG MT LOZG: 1.0000 | LOZENGE | OROMUCOSAL | Status: DC | PRN

## 2016-12-28 MED ORDER — PHENOL 1.4 % MT LIQD
1.0000 | OROMUCOSAL | Status: DC | PRN
  Filled 2016-12-28: qty 177

## 2016-12-28 MED ORDER — ACETAMINOPHEN 650 MG RE SUPP: 650.0000 mg | Freq: Four times a day (QID) | RECTAL | Status: DC | PRN

## 2016-12-28 MED ORDER — HYDRALAZINE HCL 50 MG PO TABS: 50.0000 mg | ORAL_TABLET | Freq: Three times a day (TID) | ORAL | Status: DC

## 2016-12-28 MED ORDER — ONDANSETRON HCL 4 MG PO TABS: 4.0000 mg | ORAL_TABLET | Freq: Four times a day (QID) | ORAL | Status: DC | PRN

## 2016-12-28 MED ORDER — LACTATED RINGERS IV SOLN
INTRAVENOUS | Status: DC | PRN
  Administered 2016-12-28 (×2): via INTRAVENOUS

## 2016-12-28 MED ORDER — 0.9 % SODIUM CHLORIDE (POUR BTL) OPTIME
TOPICAL | Status: DC | PRN
  Administered 2016-12-28: 1000 mL

## 2016-12-28 MED ORDER — HYDRALAZINE HCL 25 MG PO TABS
25.0000 mg | ORAL_TABLET | Freq: Three times a day (TID) | ORAL | Status: DC
  Administered 2016-12-28: 25 mg via ORAL
  Filled 2016-12-28: qty 1

## 2016-12-28 MED ORDER — LABETALOL HCL 5 MG/ML IV SOLN
INTRAVENOUS | Status: AC
  Filled 2016-12-28: qty 4

## 2016-12-28 MED ORDER — ONDANSETRON HCL 4 MG/2ML IJ SOLN: 4.0000 mg | Freq: Four times a day (QID) | INTRAMUSCULAR | Status: DC | PRN

## 2016-12-28 MED ORDER — METOCLOPRAMIDE HCL 5 MG PO TABS: 5.0000 mg | ORAL_TABLET | Freq: Three times a day (TID) | ORAL | Status: DC | PRN

## 2016-12-28 MED ORDER — ONDANSETRON HCL 4 MG/2ML IJ SOLN
INTRAMUSCULAR | Status: AC
  Filled 2016-12-28: qty 2

## 2016-12-28 MED ORDER — PROPOFOL 10 MG/ML IV BOLUS
INTRAVENOUS | Status: DC | PRN
  Administered 2016-12-28: 130 mg via INTRAVENOUS

## 2016-12-28 SURGICAL SUPPLY — 39 items
BAG ZIPLOCK 12X15 (MISCELLANEOUS) ×3 IMPLANT
BIT DRILL CANN LG 4.3MM (BIT) ×1 IMPLANT
BNDG GAUZE ELAST 4 BULKY (GAUZE/BANDAGES/DRESSINGS) ×3 IMPLANT
CLOSURE WOUND 1/2 X4 (GAUZE/BANDAGES/DRESSINGS) ×1
COVER PERINEAL POST (MISCELLANEOUS) ×3 IMPLANT
COVER SURGICAL LIGHT HANDLE (MISCELLANEOUS) ×3 IMPLANT
DRAPE INCISE IOBAN 66X45 STRL (DRAPES) ×3 IMPLANT
DRILL BIT CANN LG 4.3MM (BIT) ×3
DRSG MEPILEX BORDER 4X4 (GAUZE/BANDAGES/DRESSINGS) ×3 IMPLANT
DRSG MEPILEX BORDER 4X8 (GAUZE/BANDAGES/DRESSINGS) ×3 IMPLANT
DURAPREP 26ML APPLICATOR (WOUND CARE) ×3 IMPLANT
ELECT REM PT RETURN 15FT ADLT (MISCELLANEOUS) ×3 IMPLANT
FACESHIELD WRAPAROUND (MASK) ×9 IMPLANT
GLOVE BIO SURGEON STRL SZ7.5 (GLOVE) ×3 IMPLANT
GLOVE BIO SURGEON STRL SZ8 (GLOVE) ×6 IMPLANT
GLOVE BIOGEL PI IND STRL 8 (GLOVE) ×2 IMPLANT
GLOVE BIOGEL PI INDICATOR 8 (GLOVE) ×4
GOWN STRL REUS W/TWL LRG LVL3 (GOWN DISPOSABLE) ×3 IMPLANT
GOWN STRL REUS W/TWL XL LVL3 (GOWN DISPOSABLE) ×3 IMPLANT
GUIDEPIN 3.2X17.5 THRD DISP (PIN) ×3 IMPLANT
HFN LAG SCREW 10.5MM X 85MM (Screw) ×3 IMPLANT
KIT BASIN OR (CUSTOM PROCEDURE TRAY) ×3 IMPLANT
MANIFOLD NEPTUNE II (INSTRUMENTS) ×3 IMPLANT
NAIL HIP FRACT 130D 11X180 (Screw) ×3 IMPLANT
NS IRRIG 1000ML POUR BTL (IV SOLUTION) ×3 IMPLANT
PACK GENERAL/GYN (CUSTOM PROCEDURE TRAY) ×3 IMPLANT
POSITIONER SURGICAL ARM (MISCELLANEOUS) ×3 IMPLANT
SCREW BONE CORTICAL 5.0X3 (Screw) ×3 IMPLANT
STRIP CLOSURE SKIN 1/2X4 (GAUZE/BANDAGES/DRESSINGS) ×2 IMPLANT
SUT MNCRL AB 4-0 PS2 18 (SUTURE) ×3 IMPLANT
SUT VIC AB 0 CT1 27 (SUTURE) ×2
SUT VIC AB 0 CT1 27XBRD ANTBC (SUTURE) ×1 IMPLANT
SUT VIC AB 1 CT1 27 (SUTURE) ×2
SUT VIC AB 1 CT1 27XBRD ANTBC (SUTURE) ×1 IMPLANT
SUT VIC AB 2-0 CT1 27 (SUTURE) ×2
SUT VIC AB 2-0 CT1 TAPERPNT 27 (SUTURE) ×1 IMPLANT
SUT VIC AB 4-0 PS2 18 (SUTURE) ×3 IMPLANT
TOWEL OR 17X26 10 PK STRL BLUE (TOWEL DISPOSABLE) ×3 IMPLANT
TRAY FOLEY W/METER SILVER 16FR (SET/KITS/TRAYS/PACK) IMPLANT

## 2016-12-28 NOTE — Op Note (Signed)
  OPERATIVE REPORT   PREOPERATIVE DIAGNOSIS: Left intertrochanteric femur fracture.   POSTOP DIAGNOSIS: Left intertrochanteric femur fracture.   PROCEDURE: Intramedullary nailing, Left intertrochanteric femur  fracture.   SURGEON: Gaynelle Arabian, M.D.   ASSISTANT: Arlee Muslim, PA-C  ANESTHESIA:General  Estimated BLOOD LOSS: 50 ml  DRAINS: None.   COMPLICATIONS:   None  CONDITION: -PACU - hemodynamically stable.    CLINICAL NOTE: Kathleen Jimenez is an 83 y.o. female, who had a fall approximately 5   days ago sustaining a displaced comminuted  Left intertrochanteric femur fracture. She has been cleared medically and present for operative fixation   PROCEDURE IN DETAIL: After successful administration of  General,  the patient was placed on the fracture table with Left lower extremity in a well-padded traction boot,  Right lower extremity in a well-padded leg holder. Under fluoroscopic guidance, the fracture wasreduced. The traction was locked in this position. Thigh was prepped  and draped in the usual sterile fashion. The guide pin for the Biomet  Affixus was then passed percutaneously to the tip of the greater  trochanter, and then entered into the femoral canal. It was passed into the  canal. The small incision was made and the starter reamer passed over  the guide pin. This was then removed. The nail which was an 11  mm  diameter short trochanteric nail with 130 degrees angle was attached to  the external guide and then passed into the femoral canal, impacted to  the appropriate depth in the canal, then we used the external guide to  place the lag screw. Through the external guide, a guide pin was  passed. Small incision made, and the guide pin was in the center of the  femoral head on the AP and slightly center to posterior on the lateral.  Length was 85 mm. Triple reamer was passed over the guide pin. 85 mm  lag screw was placed. It was then locked down with a locking screw.   Through the external guide, the distal interlock was placed through the  static hole and this was 34 mm in length with excellent bicortical  purchase. The external guide was then removed. Hardware was in good  position and fracture was well reduced. Wound was copiously irrigated with saline  solution, and  closed deep with interrupted 1 Vicryl, subcu  interrupted 2-0 Vicryl, subcuticular running 4-0 Monocryl. Incision was  cleaned and dried and sterile dressings applied. The patient was awakened and  transported to recovery in stable condition.   Kathleen Plover Kamarion Zagami, MD    12/28/2016, 9:18 PM

## 2016-12-28 NOTE — Transfer of Care (Signed)
Immediate Anesthesia Transfer of Care Note  Patient: Kathleen Jimenez  Procedure(s) Performed: INTRAMEDULLARY (IM) NAIL INTERTROCHANTERIC LEFT (Left Hip)  Patient Location: PACU  Anesthesia Type:General  Level of Consciousness: awake and alert   Airway & Oxygen Therapy: Patient Spontanous Breathing and Patient connected to face mask oxygen  Post-op Assessment: Report given to RN and Post -op Vital signs reviewed and stable  Post vital signs: Reviewed and stable  Last Vitals:  Vitals:   12/28/16 1513 12/28/16 1740  BP: (!) 198/70 (!) 182/78  Pulse:  65  Resp:    Temp:    SpO2:      Last Pain:  Vitals:   12/28/16 1513  TempSrc:   PainSc: 0-No pain      Patients Stated Pain Goal: 3 (12/52/71 2929)  Complications: No apparent anesthesia complications

## 2016-12-28 NOTE — Progress Notes (Addendum)
PROGRESS NOTE    Kathleen Jimenez  YKZ:[id number] DOB: [birth year] DOA: 12/26/2016 PCP: System, Provider Not In    Brief Narrative:  Patient is a pleasant 83 year old female with history of hypertension who sustained a mechanical fall leading to her left hip fracture. Patient admitted and seen in consultation by orthopedics. Patient for probable ORIF tomorrow 12/28/2016. On admission patient noted to have a elevated renal function concern for acute kidney disease versus chronic kidney disease, anemic with a hemoglobin of 7.7. Patient s/p 2 units packed red blood cells.Patient for ORIF today 12/28/2016.   Assessment & Plan:   Principal Problem:   Closed intertrochanteric fracture of left hip (HCC) Active Problems:   Hip pain, acute, left   ARF (acute renal failure) (HCC)   HTN (hypertension)   Anemia   Dehydration   Acute blood loss anemia   Chronic kidney disease  #1 closed intertrochanteric left hip fracture Secondary to mechanical fall. Patient denies any syncope, no lightheadedness, no dizziness, no chest pain, no presyncopal episodes. Patient noted to have significant ecchymosis around the left hip likely secondary to bleeding. Pain currently controlled. Patient has been seen in consultation by orthopedics who were planning for ORIF of the left hip today 12/28/2016. Per orthopedics.  #2 acute blood loss anemia Patient noted to be anemic on admission with a hemoglobin of 7.7. Hemoglobin dropped as low as 6.7. Patient status post 2 units packed red blood cells hemoglobin currently at 10.0. Patient noted with a large ecchymotic area around the left hip around fracture site which is likely etiology of patient's anemia. Patient denies any overt bleeding. FOBT pending. Continue PPI. Anemia panel consistent with anemia of chronic disease. Follow H&H.  #3 acute renal failure versus chronic kidney disease Baseline unknown. Patient noted on admission to have a creatinine of 2.31. Patient noted  to be on ACE inhibitor, ARB and NSAIDs. Fractional excretion of sodium 0.24%.  Urinalysis with some proteinuria. Renal ultrasound with increased renal echogenicity consistent with changes of medical renal disease. Negative for hydronephrosis. Left renal cysts measuring up to 2.3 and 1.6 cm. Renal function seems to be plateauing.Check spot urine protein creatinine ratio. Will try to get records from PCPs office. Continue gentle hydration. Avoid nephrotoxins. Follow.  #4 dehydration IV fluids.  #5 hypertension Blood pressure still elevated. Due to patient's renal function patient's ARB, diuretics, ACE inhibitor have been discontinued. Patient placed on Norvasc 10 mg daily. Coreg 3.125 mg twice a day added to regimen. Will add hydralazine 25 mg by mouth every 8 hours. Follow and titrate as needed.  #6 probable UTI Urinalysis consistent with a UTI. Urine cultures pending. Continue IV Rocephin.    DVT prophylaxis: Per orthopedics. Lovenox discontinued secondary to anemia and ecchymosis noted on left hip. Code Status: Full Family Communication: Updated patient. No family at bedside. Disposition Plan:  to be determined postoperatively. Likely skilled nursing facility versus home with home health.   Consultants:   Orthopedics: Dr. Wynelle Link 12/27/2016  Procedures:   Plain films of the left hip 12/26/2016  Chest x-ray 12/26/2016   renal ultrasound 12/27/2016  2 units packed red blood cells 12/27/2016  Antimicrobials:   IV Rocephin 12/27/2016   Subjective: Patient sitting up in bed. No shortness of breath. No chest pain. No bloody bowel movements. No dysuria. Left hip pain controlled per patient.   Objective: Vitals:   12/27/16 1930 12/27/16 2100 12/28/16 0630 12/28/16 0850  BP: (!) 174/84 (!) 179/61 (!) 173/64   Pulse: 81 76 65 71  Resp: 14 16 15    Temp: 98.5 F (36.9 C) 98.1 F (36.7 C) 98.3 F (36.8 C)   TempSrc: Oral Oral Oral   SpO2: 99% 97% 96%   Weight:  57 kg (125 lb  10.6 oz)    Height:        Intake/Output Summary (Last 24 hours) at 12/28/16 1121 Last data filed at 12/28/16 1029  Gross per 24 hour  Intake          3299.58 ml  Output             2100 ml  Net          1199.58 ml   Filed Weights   12/26/16 2100 12/27/16 2100  Weight: 55 kg (121 lb 4.1 oz) 57 kg (125 lb 10.6 oz)    Examination:  General exam: NAD. Respiratory system: Clear to auscultation. No wheezes, no crackles, no rhonchi. Respiratory effort normal. Cardiovascular system: S1 & S2 heard, RRR. No JVD, murmurs, rubs, gallops or clicks. No pedal edema. Gastrointestinal system: Abdomen is nondistended, soft and nontender. No organomegaly or masses felt. Normal bowel sounds heard. Central nervous system: Alert and oriented. No focal neurological deficits. Extremities: Left lower extremity with some swelling around the hip, ecchymosis on lateral left hip extending posteriorly as well as on the inner thigh.  Skin: No rashes, lesions or ulcers Psychiatry: Judgement and insight appear normal. Mood & affect appropriate.     Data Reviewed: I have personally reviewed following labs and imaging studies  CBC:  Recent Labs Lab 12/26/16 1749 12/27/16 0602 12/27/16 2133 12/28/16 0551  WBC 9.4 7.1  --  8.1  NEUTROABS  --  4.3  --  5.5  HGB 7.7* 6.7* 10.5* 10.0*  HCT 22.3* 20.1* 31.0* 29.5*  MCV 90.7 92.2  --  89.9  PLT 199 184  --  765   Basic Metabolic Panel:  Recent Labs Lab 12/26/16 1749 12/26/16 1942 12/27/16 0602 12/28/16 0551  NA 139  --  140 141  K 3.5  --  3.6 3.9  CL 106  --  109 109  CO2 23  --  22 23  GLUCOSE 103*  --  95 103*  BUN 52*  --  48* 43*  CREATININE 2.31*  --  2.22* 2.24*  CALCIUM 8.6*  --  8.2* 8.5*  MG  --  2.1  --   --    GFR: Estimated Creatinine Clearance: 16.4 mL/min (A) (by C-G formula based on SCr of 2.24 mg/dL (H)). Liver Function Tests:  Recent Labs Lab 12/26/16 1749  AST 23  ALT 19  ALKPHOS 67  BILITOT 0.9  PROT 6.2*    ALBUMIN 3.5   No results for input(s): LIPASE, AMYLASE in the last 168 hours. No results for input(s): AMMONIA in the last 168 hours. Coagulation Profile:  Recent Labs Lab 12/26/16 1749  INR 1.04   Cardiac Enzymes: No results for input(s): CKTOTAL, CKMB, CKMBINDEX, TROPONINI in the last 168 hours. BNP (last 3 results) No results for input(s): PROBNP in the last 8760 hours. HbA1C: No results for input(s): HGBA1C in the last 72 hours. CBG: No results for input(s): GLUCAP in the last 168 hours. Lipid Profile: No results for input(s): CHOL, HDL, LDLCALC, TRIG, CHOLHDL, LDLDIRECT in the last 72 hours. Thyroid Function Tests: No results for input(s): TSH, T4TOTAL, FREET4, T3FREE, THYROIDAB in the last 72 hours. Anemia Panel:  Recent Labs  12/26/16 1942  VITAMINB12 555  FOLATE 17.8  FERRITIN 77  TIBC 252  IRON 31   Sepsis Labs: No results for input(s): PROCALCITON, LATICACIDVEN in the last 168 hours.  Recent Results (from the past 240 hour(s))  Culture, Urine     Status: None (Preliminary result)   Collection Time: 12/26/16  7:00 PM  Result Value Ref Range Status   Specimen Description URINE, RANDOM  Final   Special Requests NONE  Final   Culture   Final    CULTURE REINCUBATED FOR BETTER GROWTH Performed at [facility], [address], [place], [place] [address]    Report Status PENDING  Incomplete  Surgical PCR screen     Status: None   Collection Time: 12/27/16  6:49 AM  Result Value Ref Range Status   MRSA, PCR NEGATIVE NEGATIVE Final   Staphylococcus aureus NEGATIVE NEGATIVE Final    Comment: (NOTE) The Xpert SA Assay (FDA approved for NASAL specimens in patients 16 years of age and older), is one component of a comprehensive surveillance program. It is not intended to diagnose infection nor to guide or monitor treatment.          Radiology Studies: US Renal  Result Date: 12/27/2016 CLINICAL DATA:  83 year old hypertensive female with acute  renal failure. Initial encounter. EXAM: RENAL / URINARY TRACT ULTRASOUND COMPLETE COMPARISON:  None. FINDINGS: Right Kidney: Length: 9 cm. Increased echogenicity. No focal mass hydronephrosis. Left Kidney: Length: 8.9 cm. Increased echogenicity. No hydronephrosis. Renal cysts measuring up to 2.3 and 1.6 cm. Bladder: Decompressed by Foley catheter. IMPRESSION: Increased renal echogenicity consistent with changes of medical renal disease. No hydronephrosis. Left renal cysts measuring up to 2.3 cm and 1.6 cm. Bladder decompressed by Foley catheter. Electronically Signed   By: Genia Del M.D.   On: 12/27/2016 11:18   Portable Chest 1 View  Result Date: 12/26/2016 CLINICAL DATA:  Recent falls with weakness. EXAM: PORTABLE CHEST 1 VIEW COMPARISON:  01/24/2006. FINDINGS: 1804 hours. The lungs are clear without focal pneumonia, edema, pneumothorax or pleural effusion. Cardiopericardial silhouette is at upper limits of normal for size. The visualized bony structures of the thorax are intact. IMPRESSION: No active disease. Electronically Signed   By: Misty Stanley M.D.   On: 12/26/2016 20:19   Dg Hip Unilat With Pelvis 2-3 Views Left  Result Date: 12/26/2016 CLINICAL DATA:  Left hip pain, post multiple falls. EXAM: DG HIP (WITH OR WITHOUT PELVIS) 2-3V LEFT COMPARISON:  None. FINDINGS: There is a comminuted impacted intertrochanteric fracture of the left femur with superior migration of the distal fracture fragment and abnormal rotation of the femoral head which is located within the acetabulum. There is an associated soft tissue swelling. Prior intramedullary rod fixation of the right femur. Vascular calcifications noted. IMPRESSION: Comminuted impacted intertrochanteric fracture of the left femur. Electronically Signed   By: Fidela Salisbury M.D.   On: 12/26/2016 20:05        Scheduled Meds: . amLODipine  10 mg Oral Daily  . carvedilol  3.125 mg Oral BID WC  . hydrALAZINE  25 mg Oral Q8H  .  pantoprazole  40 mg Oral Q0600  . senna-docusate  1 tablet Oral BID   Continuous Infusions: . sodium chloride 75 mL/hr at 12/28/16 0851  . cefTRIAXone (ROCEPHIN)  IV Stopped (12/27/16 1259)     LOS: 2 days    Time spent: 25 minutes    THOMPSON,DANIEL, MD [facility] Pager [phone]  If 7PM-7AM, please contact night-coverage [email or web address] Password [email or web address] 12/28/2016, 11:21 AM

## 2016-12-28 NOTE — Anesthesia Procedure Notes (Signed)
Procedure Name: LMA Insertion Date/Time: 12/28/2016 8:26 PM Performed by: [place] Ziva Nunziata C Pre-anesthesia Checklist: Patient identified, Emergency Drugs available, Suction available and Patient being monitored Patient Re-evaluated:Patient Re-evaluated prior to induction Oxygen Delivery Method: Circle system utilized Preoxygenation: Pre-oxygenation with 100% oxygen Induction Type: IV induction Ventilation: Mask ventilation without difficulty LMA: LMA inserted LMA Size: 4.0 Number of attempts: 1 Airway Equipment and Method: Bite block Placement Confirmation: positive ETCO2 Tube secured with: Tape Dental Injury: Teeth and Oropharynx as per pre-operative assessment

## 2016-12-28 NOTE — Anesthesia Preprocedure Evaluation (Signed)
Anesthesia Evaluation  Patient identified by MRN, date of birth, ID band Patient awake    Reviewed: Allergy & Precautions, NPO status , Patient's Chart, lab work & pertinent test results  Airway Mallampati: II  TM Distance: >3 FB Neck ROM: Full    Dental no notable dental hx.    Pulmonary neg pulmonary ROS,    Pulmonary exam normal breath sounds clear to auscultation       Cardiovascular hypertension, Pt. on medications Normal cardiovascular exam Rhythm:Regular Rate:Normal     Neuro/Psych negative neurological ROS  negative psych ROS   GI/Hepatic negative GI ROS, Neg liver ROS,   Endo/Other  negative endocrine ROS  Renal/GU Renal InsufficiencyRenal disease  negative genitourinary   Musculoskeletal negative musculoskeletal ROS (+)   Abdominal   Peds negative pediatric ROS (+)  Hematology negative hematology ROS (+)   Anesthesia Other Findings S/p 2 units prbc  Reproductive/Obstetrics negative OB ROS                             Anesthesia Physical Anesthesia Plan  ASA: II  Anesthesia Plan: General   Post-op Pain Management:    Induction: Intravenous  PONV Risk Score and Plan: 3 and Ondansetron, Dexamethasone and Treatment may vary due to age or medical condition  Airway Management Planned: LMA  Additional Equipment:   Intra-op Plan:   Post-operative Plan:   Informed Consent: I have reviewed the patients History and Physical, chart, labs and discussed the procedure including the risks, benefits and alternatives for the proposed anesthesia with the patient or authorized representative who has indicated his/her understanding and acceptance.   Dental advisory given  Plan Discussed with: CRNA  Anesthesia Plan Comments:         Anesthesia Quick Evaluation

## 2016-12-28 NOTE — H&P (View-Only) (Signed)
Reason for Consult: left hip pain following fall Referring Physician: Ali Hijazi, MD  Kathleen Jimenez is an 83 y.o. female.  HPI: patient is an 83 year old female who sustained injury during a fall this past Thursday.  She was walking back into her kitchen when she fell and apparently landed on her left hip.  She denies any lightheadedness, dizziness, syncopal, presyncopal episodes associated with the fall. She does not recall tripping or stumbling either.  She thought her hip was just bruised and tried to tough it out.  She lives in [place] and her son lives and practices here in [place].  She was brought to [place] yesterday by Dr. Banfill and brought in for evaluation and xrays.  She was found to have a displaced intertroch left hip fracture.  She was placed at bedrest and admitted by the medical service.  Her HGB on admission was noted to be low at 7.7.  She is 6.7 today and will require blood.  The type of fracture she sustained is extracapsular so bleeding from the fracture will occur and be deposited into the thigh which corresponds to the amount of bruising that was found on exam today.  She will receive two units today and will most likely require further blood during the perioperative period.  This is briefly discussed with the patient and she is understands and is okay with transfusions as necessary.  Past Medical History:  Diagnosis Date  . Hypertension     Past Surgical History:  Procedure Laterality Date  . JOINT REPLACEMENT  approx 2012   R orif  . LUMBAR FUSION  approx. 2003  . THYROID SURGERY  approx. 1960   nodule removed    Family History  Problem Relation Age of Onset  . Congestive Heart Failure Mother   . Stroke Father     Social History:  reports that she has never smoked. She has never used smokeless tobacco. She reports that she does not drink alcohol or use drugs.  Allergies: No Known Allergies  Home Medications:  Lotrel 5-20 mg daily Aspirin 325 mg  prn Ibuprofen 200 mg prn Benicar 40-25 daily  Results for orders placed or performed during the hospital encounter of 12/26/16 (from the past 48 hour(s))  Urinalysis, Routine w reflex microscopic     Status: Abnormal   Collection Time: 12/26/16  5:20 PM  Result Value Ref Range   Color, Urine YELLOW YELLOW   APPearance CLOUDY (A) CLEAR   Specific Gravity, Urine 1.014 1.005 - 1.030   pH 5.0 5.0 - 8.0   Glucose, UA NEGATIVE NEGATIVE mg/dL   Hgb urine dipstick MODERATE (A) NEGATIVE   Bilirubin Urine NEGATIVE NEGATIVE   Ketones, ur NEGATIVE NEGATIVE mg/dL   Protein, ur >=300 (A) NEGATIVE mg/dL   Nitrite NEGATIVE NEGATIVE   Leukocytes, UA LARGE (A) NEGATIVE   RBC / HPF 6-30 0 - 5 RBC/hpf   WBC, UA TOO NUMEROUS TO COUNT 0 - 5 WBC/hpf   Bacteria, UA MANY (A) NONE SEEN   Squamous Epithelial / LPF 0-5 (A) NONE SEEN   WBC Clumps PRESENT   CBC     Status: Abnormal   Collection Time: 12/26/16  5:49 PM  Result Value Ref Range   WBC 9.4 4.0 - 10.5 K/uL   RBC 2.46 (L) 3.87 - 5.11 MIL/uL   Hemoglobin 7.7 (L) 12.0 - 15.0 g/dL   HCT 22.3 (L) 36.0 - 46.0 %   MCV 90.7 78.0 - 100.0 fL     MCH 31.3 26.0 - 34.0 pg   MCHC 34.5 30.0 - 36.0 g/dL   RDW 13.6 11.5 - 15.5 %   Platelets 199 150 - 400 K/uL  Comprehensive metabolic panel     Status: Abnormal   Collection Time: 12/26/16  5:49 PM  Result Value Ref Range   Sodium 139 135 - 145 mmol/L   Potassium 3.5 3.5 - 5.1 mmol/L   Chloride 106 101 - 111 mmol/L   CO2 23 22 - 32 mmol/L   Glucose, Bld 103 (H) 65 - 99 mg/dL   BUN 52 (H) 6 - 20 mg/dL   Creatinine, Ser 2.31 (H) 0.44 - 1.00 mg/dL   Calcium 8.6 (L) 8.9 - 10.3 mg/dL   Total Protein 6.2 (L) 6.5 - 8.1 g/dL   Albumin 3.5 3.5 - 5.0 g/dL   AST 23 15 - 41 U/L   ALT 19 14 - 54 U/L   Alkaline Phosphatase 67 38 - 126 U/L   Total Bilirubin 0.9 0.3 - 1.2 mg/dL   GFR calc non Af Amer 18 (L) >60 mL/min   GFR calc Af Amer 21 (L) >60 mL/min    Comment: (NOTE) The eGFR has been calculated using the CKD  EPI equation. This calculation has not been validated in all clinical situations. eGFR's persistently <60 mL/min signify possible Chronic Kidney Disease.    Anion gap 10 5 - 15  Protime-INR     Status: None   Collection Time: 12/26/16  5:49 PM  Result Value Ref Range   Prothrombin Time 13.5 11.4 - 15.2 seconds   INR 1.04   Sodium, urine, random     Status: None   Collection Time: 12/26/16  7:02 PM  Result Value Ref Range   Sodium, Ur 19 mmol/L    Comment: Performed at [facility], [address] [place], [place] [address]  Creatinine, urine, random     Status: None   Collection Time: 12/26/16  7:02 PM  Result Value Ref Range   Creatinine, Urine 133.37 mg/dL    Comment: Performed at [facility], [address] [place], [place] [address]  Vitamin B12     Status: None   Collection Time: 12/26/16  7:42 PM  Result Value Ref Range   Vitamin B-12 555 180 - 914 pg/mL    Comment: (NOTE) This assay is not validated for testing neonatal or myeloproliferative syndrome specimens for Vitamin B12 levels. Performed at [facility], [address] [place], [place] [address]   Folate     Status: None   Collection Time: 12/26/16  7:42 PM  Result Value Ref Range   Folate 17.8 >5.9 ng/mL    Comment: Performed at [facility], [address] [place], [place] [address]  Iron and TIBC     Status: None   Collection Time: 12/26/16  7:42 PM  Result Value Ref Range   Iron 31 28 - 170 ug/dL   TIBC 252 250 - 450 ug/dL   Saturation Ratios 12 10.4 - 31.8 %   UIBC 221 ug/dL    Comment: Performed at [facility], [address] [place], [place] [address]  Ferritin     Status: None   Collection Time: 12/26/16  7:42 PM  Result Value Ref Range   Ferritin 77 11 - 307 ng/mL    Comment: Performed at [facility], [address] [place], [place] [address]  Magnesium     Status:   None   Collection Time: 12/26/16  7:42 PM  Result Value Ref Range   Magnesium 2.1 1.7  - 2.4 mg/dL  CBC with Differential/Platelet     Status: Abnormal   Collection Time: 12/27/16  6:02 AM  Result Value Ref Range   WBC 7.1 4.0 - 10.5 K/uL   RBC 2.18 (L) 3.87 - 5.11 MIL/uL   Hemoglobin 6.7 (LL) 12.0 - 15.0 g/dL    Comment: CRITICAL RESULT CALLED TO, READ BACK BY AND VERIFIED WITH: Mateo Flow, D. RN _0  ON 10.9.18 BY NMCCOY    HCT 20.1 (L) 36.0 - 46.0 %   MCV 92.2 78.0 - 100.0 fL   MCH 30.7 26.0 - 34.0 pg   MCHC 33.3 30.0 - 36.0 g/dL   RDW 13.8 11.5 - 15.5 %   Platelets 184 150 - 400 K/uL   Neutrophils Relative % 61 %   Neutro Abs 4.3 1.7 - 7.7 K/uL   Lymphocytes Relative 16 %   Lymphs Abs 1.2 0.7 - 4.0 K/uL   Monocytes Relative 11 %   Monocytes Absolute 0.8 0.1 - 1.0 K/uL   Eosinophils Relative 11 %   Eosinophils Absolute 0.8 (H) 0.0 - 0.7 K/uL   Basophils Relative 1 %   Basophils Absolute 0.1 0.0 - 0.1 K/uL  Basic metabolic panel     Status: Abnormal   Collection Time: 12/27/16  6:02 AM  Result Value Ref Range   Sodium 140 135 - 145 mmol/L   Potassium 3.6 3.5 - 5.1 mmol/L   Chloride 109 101 - 111 mmol/L   CO2 22 22 - 32 mmol/L   Glucose, Bld 95 65 - 99 mg/dL   BUN 48 (H) 6 - 20 mg/dL   Creatinine, Ser 2.22 (H) 0.44 - 1.00 mg/dL   Calcium 8.2 (L) 8.9 - 10.3 mg/dL   GFR calc non Af Amer 19 (L) >60 mL/min   GFR calc Af Amer 22 (L) >60 mL/min    Comment: (NOTE) The eGFR has been calculated using the CKD EPI equation. This calculation has not been validated in all clinical situations. eGFR's persistently <60 mL/min signify possible Chronic Kidney Disease.    Anion gap 9 5 - 15    Portable Chest 1 View  Result Date: 12/26/2016 CLINICAL DATA:  Recent falls with weakness. EXAM: PORTABLE CHEST 1 VIEW COMPARISON:  01/24/2006. FINDINGS: 1804 hours. The lungs are clear without focal pneumonia, edema, pneumothorax or pleural effusion. Cardiopericardial silhouette is at upper limits of normal for size. The visualized bony structures of the thorax are intact.  IMPRESSION: No active disease. Electronically Signed   By: Misty Stanley M.D.   On: 12/26/2016 20:19   Dg Hip Unilat With Pelvis 2-3 Views Left  Result Date: 12/26/2016 CLINICAL DATA:  Left hip pain, post multiple falls. EXAM: DG HIP (WITH OR WITHOUT PELVIS) 2-3V LEFT COMPARISON:  None. FINDINGS: There is a comminuted impacted intertrochanteric fracture of the left femur with superior migration of the distal fracture fragment and abnormal rotation of the femoral head which is located within the acetabulum. There is an associated soft tissue swelling. Prior intramedullary rod fixation of the right femur. Vascular calcifications noted. IMPRESSION: Comminuted impacted intertrochanteric fracture of the left femur. Electronically Signed   By: Fidela Salisbury M.D.   On: 12/26/2016 20:05    Review of Systems  Constitutional: Negative.   HENT: Negative.   Respiratory: Negative.   Cardiovascular: Negative.   Gastrointestinal: Negative.   Musculoskeletal: Positive for joint pain (left hip pain).  Blood pressure (!) 160/60, pulse 66, temperature 97.9 F (36.6 C), temperature source Oral, resp. rate 16, height 5' 4" (1.626 m), weight 55 kg (121 lb 4.1 oz), SpO2 98 %. Physical Exam  Cardiovascular: Intact distal pulses.   Musculoskeletal:       Left hip: She exhibits swelling.  Skin: Ecchymosis (noted on lateral left hip in troch region and extending posteriorly,  also noted on inner thigh toward groin region) noted.  Motor function intact to the left lower leg - moving foot and toes well on exam. Pain on hip roll.  Assessment/Plan: Left Intertroch hip fracture, angulated, displaced, closed  Plan:  Patient seen by Dr. Artelia Game and dicussed surgical intervention. Plan will be to undergo surgery tomorrow afternoon.  Blood today and recheck labs.  Patient may need further blood even after today and during the perioperative period. Bedrest. Regular diet today. Clear liquids in the morning and then  NPO after breakfast tomorrow. Consent - ORIF left hip  ALEXZANDREW PERKINS 12/27/2016, 9:57 AM     

## 2016-12-28 NOTE — Progress Notes (Signed)
   Subjective: Hospital day - 2 Patient reports pain as mild.   Patient seen in rounds by Dr. Wynelle Link. Patient is well, and has had no acute complaints or problems  Objective: Vital signs in last 24 hours: Temp:  [97.7 F (36.5 C)-98.5 F (36.9 C)] 98.3 F (36.8 C) (10/10 0630) Pulse Rate:  [65-93] 65 (10/10 0630) Resp:  [14-16] 15 (10/10 0630) BP: (146-179)/(57-84) 173/64 (10/10 0630) SpO2:  [96 %-99 %] 96 % (10/10 0630) Weight:  [57 kg (125 lb 10.6 oz)] 57 kg (125 lb 10.6 oz) (10/09 2100)  Intake/Output from previous day:  Intake/Output Summary (Last 24 hours) at 12/28/16 0815 Last data filed at 12/28/16 0631  Gross per 24 hour  Intake          3179.58 ml  Output             2050 ml  Net          1129.58 ml    Intake/Output this shift: No intake/output data recorded.  Labs:  Recent Labs  12/26/16 1749 12/27/16 0602 12/27/16 2133 12/28/16 0551  HGB 7.7* 6.7* 10.5* 10.0*    Recent Labs  12/27/16 0602 12/27/16 2133 12/28/16 0551  WBC 7.1  --  8.1  RBC 2.18*  --  3.28*  HCT 20.1* 31.0* 29.5*  PLT 184  --  183    Recent Labs  12/27/16 0602 12/28/16 0551  NA 140 141  K 3.6 3.9  CL 109 109  CO2 22 23  BUN 48* 43*  CREATININE 2.22* 2.24*  GLUCOSE 95 103*  CALCIUM 8.2* 8.5*    Recent Labs  12/26/16 1749  INR 1.04    EXAM General - Patient is Alert and Appropriate Extremity - Neurovascular intact Sensation intact distally Motor Function - intact, moving foot and toes well on exam. Moderate bruising around left hip due to fracture  Past Medical History:  Diagnosis Date  . Hypertension     Assessment/Plan: Hospital day - 2 Principal Problem:   Closed intertrochanteric fracture of left hip (HCC) Active Problems:   Hip pain, acute, left   ARF (acute renal failure) (HCC)   HTN (hypertension)   Anemia   Dehydration   Acute blood loss anemia  Estimated body mass index is 21.57 kg/m as calculated from the following:   Height as of this  encounter: 5\' 4"  (1.626 m).   Weight as of this encounter: 57 kg (125 lb 10.6 oz). NPO for surgery later today.  Arlee Muslim, PA-C Orthopaedic Surgery 12/28/2016, 8:15 AM

## 2016-12-28 NOTE — Interval H&P Note (Signed)
History and Physical Interval Note:  12/28/2016 7:19 PM  Kathleen Jimenez  has presented today for surgery, with the diagnosis of left intertrochanteric femur fracture  The various methods of treatment have been discussed with the patient and family. After consideration of risks, benefits and other options for treatment, the patient has consented to  Procedure(s): INTRAMEDULLARY (IM) NAIL INTERTROCHANTERIC LEFT (Left) as a surgical intervention .  The patient's history has been reviewed, patient examined, no change in status, stable for surgery.  I have reviewed the patient's chart and labs.  Questions were answered to the patient's satisfaction.     Gearlean Alf

## 2016-12-29 ENCOUNTER — Encounter (HOSPITAL_COMMUNITY): Payer: Self-pay | Admitting: Orthopedic Surgery

## 2016-12-29 DIAGNOSIS — S72142A Displaced intertrochanteric fracture of left femur, initial encounter for closed fracture: Principal | ICD-10-CM

## 2016-12-29 DIAGNOSIS — N183 Chronic kidney disease, stage 3 (moderate): Secondary | ICD-10-CM

## 2016-12-29 LAB — CBC WITH DIFFERENTIAL/PLATELET
BASOS PCT: 0 %
Basophils Absolute: 0 10*3/uL (ref 0.0–0.1)
EOS ABS: 0 10*3/uL (ref 0.0–0.7)
Eosinophils Relative: 0 %
HCT: 29.1 % — ABNORMAL LOW (ref 36.0–46.0)
Hemoglobin: 9.8 g/dL — ABNORMAL LOW (ref 12.0–15.0)
Lymphocytes Relative: 5 %
Lymphs Abs: 0.3 10*3/uL — ABNORMAL LOW (ref 0.7–4.0)
MCH: 30.2 pg (ref 26.0–34.0)
MCHC: 33.7 g/dL (ref 30.0–36.0)
MCV: 89.5 fL (ref 78.0–100.0)
MONOS PCT: 1 %
Monocytes Absolute: 0.1 10*3/uL (ref 0.1–1.0)
NEUTROS PCT: 94 %
Neutro Abs: 6.6 10*3/uL (ref 1.7–7.7)
Platelets: 185 10*3/uL (ref 150–400)
RBC: 3.25 MIL/uL — ABNORMAL LOW (ref 3.87–5.11)
RDW: 13.9 % (ref 11.5–15.5)
WBC: 7 10*3/uL (ref 4.0–10.5)

## 2016-12-29 LAB — BASIC METABOLIC PANEL
Anion gap: 10 (ref 5–15)
BUN: 38 mg/dL — ABNORMAL HIGH (ref 6–20)
CALCIUM: 8.3 mg/dL — AB (ref 8.9–10.3)
CO2: 21 mmol/L — ABNORMAL LOW (ref 22–32)
CREATININE: 1.95 mg/dL — AB (ref 0.44–1.00)
Chloride: 105 mmol/L (ref 101–111)
GFR calc non Af Amer: 23 mL/min — ABNORMAL LOW (ref >60)
GFR, EST AFRICAN AMERICAN: 26 mL/min — AB (ref >60)
Glucose, Bld: 189 mg/dL — ABNORMAL HIGH (ref 65–99)
Potassium: 4 mmol/L (ref 3.5–5.1)
SODIUM: 136 mmol/L (ref 135–145)

## 2016-12-29 MED ORDER — POLYETHYLENE GLYCOL 3350 17 G PO PACK
17.0000 g | PACK | Freq: Every day | ORAL | Status: DC
  Administered 2016-12-29 – 2017-01-01 (×4): 17 g via ORAL
  Filled 2016-12-29 (×5): qty 1

## 2016-12-29 NOTE — Evaluation (Signed)
Physical Therapy Evaluation Patient Details Name: Kathleen Jimenez MRN: [id number] DOB: 04-07-1931 Today's Date: 12/29/2016   History of Present Illness  Pt is an 83 year old female with history of hypertension who sustained a mechanical fall leading to her left hip fracture as well as found to have acute blood loss anemia.  Pt currently s/p 2 units of PRBCs and had left Intramedullary nailing surgery for hip fracture on 12/28/16.  Clinical Impression  Patient is s/p above surgery resulting in functional limitations due to the deficits listed below (see PT Problem List).  Patient will benefit from skilled PT to increase their independence and safety with mobility to allow discharge to the venue listed below.  Pt assisted with ambulating 7 feet with RW POD #1.  Pt uncertain of d/c plans at this time.  Pt could stay at son's home however also agreeable to rehab at SNF level.  Recommend assist available upon d/c for mobility.     Follow Up Recommendations SNF;Supervision for mobility/OOB (SNF if family unable to assist at home)    Equipment Recommendations  Rolling walker with 5" wheels (youth)    Recommendations for Other Services       Precautions / Restrictions Precautions Precautions: Fall Restrictions LLE Weight Bearing: Weight bearing as tolerated      Mobility  Bed Mobility Overal bed mobility: Needs Assistance Bed Mobility: Supine to Sit     Supine to sit: Min guard;HOB elevated     General bed mobility comments: verbal cues for self assist  Transfers Overall transfer level: Needs assistance Equipment used: Rolling walker (2 wheeled) Transfers: Sit to/from Stand Sit to Stand: Min assist         General transfer comment: verbal cues for UE and LE positioning, assist to rise and steady  Ambulation/Gait Ambulation/Gait assistance: Min assist Ambulation Distance (Feet): 7 Feet Assistive device: Rolling walker (2 wheeled) Gait Pattern/deviations: Step-to  pattern;Decreased stance time - left;Antalgic     General Gait Details: verbal cues for sequence, RW positioning, step length, posture, distance limited by fatigue, recliner following  Stairs            Wheelchair Mobility    Modified Rankin (Stroke Patients Only)       Balance Overall balance assessment: History of Falls                                           Pertinent Vitals/Pain Pain Assessment: 0-10 Pain Score: 2  Pain Location: L hip Pain Descriptors / Indicators: Discomfort;Sore Pain Intervention(s): Limited activity within patient's tolerance;Monitored during session;Repositioned;Ice applied    Home Living Family/patient expects to be discharged to:: Unsure Living Arrangements: Spouse/significant other               Additional Comments: pt from [place], son lives here, plans to return home with son or possibly SNF; son's home has flat entrance and pt can stay on main level    Prior Function Level of Independence: Independent               Hand Dominance        Extremity/Trunk Assessment        Lower Extremity Assessment Lower Extremity Assessment: LLE deficits/detail LLE Deficits / Details: anticipated post op hip weakness, requiring assist for bed mobility, able to perform ankle pump       Communication   Communication: No difficulties  Cognition Arousal/Alertness: Awake/alert Behavior During Therapy: WFL for tasks assessed/performed Overall Cognitive Status: Within Functional Limits for tasks assessed                                        General Comments      Exercises     Assessment/Plan    PT Assessment Patient needs continued PT services  PT Problem List Decreased range of motion;Decreased strength;Decreased mobility;Decreased balance;Decreased knowledge of use of DME;Pain       PT Treatment Interventions Gait training;DME instruction;Therapeutic activities;Therapeutic  exercise;Functional mobility training;Patient/family education    PT Goals (Current goals can be found in the Care Plan section)  Acute Rehab PT Goals PT Goal Formulation: With patient Time For Goal Achievement: 01/05/17 Potential to Achieve Goals: Good    Frequency Min 3X/week   Barriers to discharge        Co-evaluation               AM-PAC PT "6 Clicks" Daily Activity  Outcome Measure Difficulty turning over in bed (including adjusting bedclothes, sheets and blankets)?: A Little Difficulty moving from lying on back to sitting on the side of the bed? : A Lot Difficulty sitting down on and standing up from a chair with arms (e.g., wheelchair, bedside commode, etc,.)?: Unable Help needed moving to and from a bed to chair (including a wheelchair)?: A Lot Help needed walking in hospital room?: A Lot Help needed climbing 3-5 steps with a railing? : Total 6 Click Score: 11    End of Session Equipment Utilized During Treatment: Gait belt Activity Tolerance: Patient limited by fatigue Patient left: in chair;with call bell/phone within reach;with chair alarm set Nurse Communication: Mobility status PT Visit Diagnosis: Other abnormalities of gait and mobility (R26.89)    Time: [id number] PT Time Calculation (min) (ACUTE ONLY): 20 min   Charges:   PT Evaluation $PT Eval Low Complexity: 1 Low     PT G CodesCarmelia Bake, PT, DPT 12/29/2016 Pager: [phone]  York Ram E 12/29/2016, 12:32 PM

## 2016-12-29 NOTE — Anesthesia Postprocedure Evaluation (Signed)
Anesthesia Post Note  Patient: Kathleen Jimenez  Procedure(s) Performed: INTRAMEDULLARY (IM) NAIL INTERTROCHANTERIC LEFT (Left Hip)     Patient location during evaluation: PACU Anesthesia Type: General Level of consciousness: awake and alert Pain management: pain level controlled Vital Signs Assessment: post-procedure vital signs reviewed and stable Respiratory status: spontaneous breathing, nonlabored ventilation, respiratory function stable and patient connected to nasal cannula oxygen Cardiovascular status: blood pressure returned to baseline and stable Postop Assessment: no apparent nausea or vomiting Anesthetic complications: no    Last Vitals:  Vitals:   12/28/16 2328 12/29/16 0038  BP: (!) 172/84 (!) 147/80  Pulse: 76 68  Resp: 14 15  Temp: (!) 36.4 C 37.1 C  SpO2: 98% 100%    Last Pain:  Vitals:   12/29/16 0038  TempSrc: Oral  PainSc:                  Montez Hageman

## 2016-12-29 NOTE — Progress Notes (Signed)
   Subjective: 1 Day Post-Op Procedure(s) (LRB): INTRAMEDULLARY (IM) NAIL INTERTROCHANTERIC LEFT (Left) Patient reports pain as mild.   Patient seen in rounds by Dr. Wynelle Link. Patient is well, but has had some minor complaints of pain in the hip, requiring pain medications We will start therapy today. Patient allowed to be WBAT to the left leg  Objective: Vital signs in last 24 hours: Temp:  [97.5 F (36.4 C)-98.7 F (37.1 C)] 98.5 F (36.9 C) (10/11 0628) Pulse Rate:  [59-87] 63 (10/11 0628) Resp:  [12-19] 18 (10/11 0628) BP: (130-198)/(61-96) 139/63 (10/11 0628) SpO2:  [98 %-100 %] 100 % (10/11 0628)  Intake/Output from previous day: 10/10 0701 - 10/11 0700 In: 1960 [P.O.:360; I.V.:1600] Out: 1625 [JMEQA:8341; Blood:50] Intake/Output this shift: No intake/output data recorded.   Recent Labs  12/26/16 1749 12/27/16 0602 12/27/16 2133 12/28/16 0551 12/29/16 0522  HGB 7.7* 6.7* 10.5* 10.0* 9.8*    Recent Labs  12/28/16 0551 12/29/16 0522  WBC 8.1 7.0  RBC 3.28* 3.25*  HCT 29.5* 29.1*  PLT 183 185    Recent Labs  12/28/16 0551 12/29/16 0522  NA 141 136  K 3.9 4.0  CL 109 105  CO2 23 21*  BUN 43* 38*  CREATININE 2.24* 1.95*  GLUCOSE 103* 189*  CALCIUM 8.5* 8.3*    Recent Labs  12/26/16 1749  INR 1.04    EXAM General - Patient is Alert and Appropriate Extremity - Neurovascular intact Sensation intact distally Dressing - drainage noted from proximal incision dressing, likely due to the previous hematoma from fracture and swelling due to surgery Motor Function - intact, moving foot and toes well on exam.   Past Medical History:  Diagnosis Date  . Hypertension     Assessment/Plan: 1 Day Post-Op Procedure(s) (LRB): INTRAMEDULLARY (IM) NAIL INTERTROCHANTERIC LEFT (Left) Principal Problem:   Closed intertrochanteric fracture of left hip (HCC) Active Problems:   Hip pain, acute, left   ARF (acute renal failure) (HCC)   HTN (hypertension)  Anemia   Dehydration   Acute blood loss anemia   Chronic kidney disease  Estimated body mass index is 21.57 kg/m as calculated from the following:   Height as of this encounter: 5\' 4"  (1.626 m).   Weight as of this encounter: 57 kg (125 lb 10.6 oz). Advance diet Up with therapy  DVT Prophylaxis - Eliquis Weight-Bearing as tolerated to left leg D/C O2 and Pulse OX and try on Room Air  HGB looks good following blood and post surgery - 9.8 this morning Creat okay at 1.95, improving  Arlee Muslim, PA-C Orthopaedic Surgery 12/29/2016, 7:43 AM

## 2016-12-29 NOTE — Progress Notes (Addendum)
PROGRESS NOTE    Kathleen Jimenez  ZOX:[id number] DOB: 03/20/1932 DOA: 12/26/2016 PCP: System, Provider Not In    Brief Narrative:  Patient is a pleasant 83 year old female with history of hypertension who sustained a mechanical fall leading to her left hip fracture. Patient admitted and seen in consultation by orthopedics. Patient for probable ORIF tomorrow 12/28/2016. On admission patient noted to have a elevated renal function concern for acute kidney disease versus chronic kidney disease, anemic with a hemoglobin of 7.7. Patient s/p 2 units packed red blood cells.Patient had ORIF   12/28/2016.Will need SNF    Assessment & Plan:   Principal Problem:   Closed intertrochanteric fracture of left hip (HCC) Active Problems:   Hip pain, acute, left   ARF (acute renal failure) (HCC)   HTN (hypertension)   Anemia   Dehydration   Acute blood loss anemia   Chronic kidney disease    #1 closed intertrochanteric left hip fracture, s/p Intramedullary nailing, Left intertrochanteric femur  Secondary to mechanical fall.  . Patient noted to have significant ecchymosis around the left hip likely secondary to bleeding. Pain currently controlled. Patient is s/p  ORIF of the left hip   12/28/2016.  On eliquis for dvt prophylaxsis   #2 acute blood loss anemia Patient noted to be anemic on admission with a hemoglobin of 7.7. Hemoglobin dropped as low as 6.7. Patient status post 2 units packed red blood cells hemoglobin currently at 10.0>9.8 . Patient noted with a large ecchymotic area around the left hip around fracture site which is likely etiology of patient's anemia. . Follow H&H.  #3 acute renal failure versus chronic kidney disease Baseline unknown. Patient noted on admission to have a creatinine of 2.31. Patient noted to be on ACE inhibitor, ARB and NSAIDs. Fractional excretion of sodium 0.24%.  Urinalysis with some proteinuria. Renal ultrasound with increased renal echogenicity consistent with  changes of medical renal disease. Negative for hydronephrosis. Left renal cysts measuring up to 2.3 and 1.6 cm. Renal function  Improving  , creatinine now 1.95 . Continue gentle hydration. Avoid nephrotoxins. Follow.  #4 dehydration IV fluids.  #5 hypertension Blood pressure still elevated. Due to patient's renal function patient's ARB, diuretics, ACE inhibitor have been discontinued. Patient placed on Norvasc 10 mg daily. Coreg 3.125 mg twice a day added to regimen. Will add hydralazine 25 mg by mouth every 8 hours. Continue to follow   #6 probable UTI Urinalysis consistent with a UTI. Urine cultures shows klebsiella PNA . Sensitivity pending . Continue IV Rocephin.    DVT prophylaxis: Per orthopedics. Lovenox discontinued secondary to anemia and ecchymosis noted on left hip. Code Status: Full Family Communication: Updated patient. No family at bedside. Disposition Plan:  Like SNF in am    Consultants:   Orthopedics: Dr. Wynelle Link 12/27/2016  Procedures:   Plain films of the left hip 12/26/2016  Chest x-ray 12/26/2016   renal ultrasound 12/27/2016  2 units packed red blood cells 12/27/2016  Antimicrobials:   IV Rocephin 12/27/2016   Subjective:  constipated , pain is well controlled   Objective: Vitals:   12/29/16 0628 12/29/16 0911 12/29/16 0957 12/29/16 1342  BP: 139/63 (!) 129/48 137/63 137/88  Pulse: 63 63  63  Resp: 18 16  18   Temp: 98.5 F (36.9 C) 98.3 F (36.8 C)  98.1 F (36.7 C)  TempSrc: Oral Oral  Oral  SpO2: 100% 98%  100%  Weight:      Height:  Intake/Output Summary (Last 24 hours) at 12/29/16 1531 Last data filed at 12/29/16 1341  Gross per 24 hour  Intake             1960 ml  Output             1575 ml  Net              385 ml   Filed Weights   12/26/16 2100 12/27/16 2100  Weight: 55 kg (121 lb 4.1 oz) 57 kg (125 lb 10.6 oz)    Examination:  General exam: NAD. Respiratory system: Clear to auscultation. No wheezes, no  crackles, no rhonchi. Respiratory effort normal. Cardiovascular system: S1 & S2 heard, RRR. No JVD, murmurs, rubs, gallops or clicks. No pedal edema. Gastrointestinal system: Abdomen is nondistended, soft and nontender. No organomegaly or masses felt. Normal bowel sounds heard. Central nervous system: Alert and oriented. No focal neurological deficits. Extremities: Left lower extremity with some swelling around the hip, ecchymosis on lateral left hip extending posteriorly as well as on the inner thigh.  Skin: No rashes, lesions or ulcers Psychiatry: Judgement and insight appear normal. Mood & affect appropriate.     Data Reviewed: I have personally reviewed following labs and imaging studies  CBC:  Recent Labs Lab 12/26/16 1749 12/27/16 0602 12/27/16 2133 12/28/16 0551 12/29/16 0522  WBC 9.4 7.1  --  8.1 7.0  NEUTROABS  --  4.3  --  5.5 6.6  HGB 7.7* 6.7* 10.5* 10.0* 9.8*  HCT 22.3* 20.1* 31.0* 29.5* 29.1*  MCV 90.7 92.2  --  89.9 89.5  PLT 199 184  --  183 630   Basic Metabolic Panel:  Recent Labs Lab 12/26/16 1749 12/26/16 1942 12/27/16 0602 12/28/16 0551 12/29/16 0522  NA 139  --  140 141 136  K 3.5  --  3.6 3.9 4.0  CL 106  --  109 109 105  CO2 23  --  22 23 21*  GLUCOSE 103*  --  95 103* 189*  BUN 52*  --  48* 43* 38*  CREATININE 2.31*  --  2.22* 2.24* 1.95*  CALCIUM 8.6*  --  8.2* 8.5* 8.3*  MG  --  2.1  --   --   --    GFR: Estimated Creatinine Clearance: 18.9 mL/min (A) (by C-G formula based on SCr of 1.95 mg/dL (H)). Liver Function Tests:  Recent Labs Lab 12/26/16 1749  AST 23  ALT 19  ALKPHOS 67  BILITOT 0.9  PROT 6.2*  ALBUMIN 3.5   No results for input(s): LIPASE, AMYLASE in the last 168 hours. No results for input(s): AMMONIA in the last 168 hours. Coagulation Profile:  Recent Labs Lab 12/26/16 1749  INR 1.04   Cardiac Enzymes: No results for input(s): CKTOTAL, CKMB, CKMBINDEX, TROPONINI in the last 168 hours. BNP (last 3  results) No results for input(s): PROBNP in the last 8760 hours. HbA1C: No results for input(s): HGBA1C in the last 72 hours. CBG: No results for input(s): GLUCAP in the last 168 hours. Lipid Profile: No results for input(s): CHOL, HDL, LDLCALC, TRIG, CHOLHDL, LDLDIRECT in the last 72 hours. Thyroid Function Tests: No results for input(s): TSH, T4TOTAL, FREET4, T3FREE, THYROIDAB in the last 72 hours. Anemia Panel:  Recent Labs  12/26/16 1942  VITAMINB12 555  FOLATE 17.8  FERRITIN 77  TIBC 252  IRON 31   Sepsis Labs: No results for input(s): PROCALCITON, LATICACIDVEN in the last 168 hours.  Recent Results (from the  past 240 hour(s))  Culture, Urine     Status: Abnormal (Preliminary result)   Collection Time: 12/26/16  7:00 PM  Result Value Ref Range Status   Specimen Description URINE, RANDOM  Final   Special Requests NONE  Final   Culture >=100,000 COLONIES/mL KLEBSIELLA PNEUMONIAE (A)  Final   Report Status PENDING  Incomplete  Surgical PCR screen     Status: None   Collection Time: 12/27/16  6:49 AM  Result Value Ref Range Status   MRSA, PCR NEGATIVE NEGATIVE Final   Staphylococcus aureus NEGATIVE NEGATIVE Final    Comment: (NOTE) The Xpert SA Assay (FDA approved for NASAL specimens in patients 69 years of age and older), is one component of a comprehensive surveillance program. It is not intended to diagnose infection nor to guide or monitor treatment.          Radiology Studies: Dg C-arm 1-60 Min-no Report  Result Date: 12/28/2016 CLINICAL DATA:  Internal fixation of left hip fracture. Initial encounter. EXAM: OPERATIVE LEFT HIP (WITH PELVIS IF PERFORMED) 2 VIEWS TECHNIQUE: Fluoroscopic spot image(s) were submitted for interpretation post-operatively. COMPARISON:  Left hip radiographs performed 12/26/2016 FINDINGS: Three fluoroscopic C-arm images are provided from the OR, demonstrating placement of a left hip screw transfixing the intertrochanteric fracture in  near anatomic alignment. No new fracture is seen. The left femoral head remains seated at the acetabulum. IMPRESSION: Status post internal fixation of left femoral intertrochanteric fracture in near anatomic alignment. No new fracture seen. Electronically Signed   By: Garald Balding M.D.   On: 12/28/2016 22:01   Dg Hip Operative Unilat W Or W/o Pelvis Left  Result Date: 12/28/2016 CLINICAL DATA:  Internal fixation of left hip fracture. Initial encounter. EXAM: OPERATIVE LEFT HIP (WITH PELVIS IF PERFORMED) 2 VIEWS TECHNIQUE: Fluoroscopic spot image(s) were submitted for interpretation post-operatively. COMPARISON:  Left hip radiographs performed 12/26/2016 FINDINGS: Three fluoroscopic C-arm images are provided from the OR, demonstrating placement of a left hip screw transfixing the intertrochanteric fracture in near anatomic alignment. No new fracture is seen. The left femoral head remains seated at the acetabulum. IMPRESSION: Status post internal fixation of left femoral intertrochanteric fracture in near anatomic alignment. No new fracture seen. Electronically Signed   By: Garald Balding M.D.   On: 12/28/2016 22:01        Scheduled Meds: . amLODipine  10 mg Oral Daily  . apixaban  2.5 mg Oral BID  . carvedilol  3.125 mg Oral BID WC  . hydrALAZINE  25 mg Oral Q8H  . pantoprazole  40 mg Oral Q0600  . senna-docusate  1 tablet Oral BID   Continuous Infusions: . sodium chloride 75 mL/hr at 12/29/16 0141  . cefTRIAXone (ROCEPHIN)  IV 1 g (12/29/16 1228)     LOS: 3 days    Time spent: 35 minutes    Reyne Dumas, MD [facility] Pager [phone]  If 7PM-7AM, please contact night-coverage [email or web address] Password [email or web address] 12/29/2016, 3:31 PM

## 2016-12-29 NOTE — Evaluation (Signed)
Occupational Therapy Evaluation Patient Details Name: Kathleen Jimenez MRN: [id number] DOB: October 31, 1931 Today's Date: 12/29/2016    History of Present Illness Pt is an 83 year old female with history of hypertension who sustained a mechanical fall leading to her left hip fracture as well as found to have acute blood loss anemia.  Pt currently s/p 2 units of PRBCs and had left Intramedullary nailing surgery for hip fracture on 12/28/16.   Clinical Impression   Pt was admitted for the above.  She was independent with adls prior to fall. She currently needs mostly min A, except for mod A for LB dressing. Goals in acute are for min guard to supervision level    Follow Up Recommendations  SNF (unless family can provide necessary assistance)    Equipment Recommendations  3 in 1 bedside commode    Recommendations for Other Services       Precautions / Restrictions Precautions Precautions: Fall Restrictions LLE Weight Bearing: Weight bearing as tolerated      Mobility Bed Mobility by PT Overal bed mobility: Needs Assistance Bed Mobility: Supine to Sit     Supine to sit: Min guard;HOB elevated     General bed mobility comments: oob  Transfers Overall transfer level: Needs assistance Equipment used: Rolling walker (2 wheeled) Transfers: Sit to/from Stand Sit to Stand: Min assist         General transfer comment: verbal cues for UE and LE positioning, assist to rise and steady    Balance Overall balance assessment: History of Falls                                         ADL either performed or assessed with clinical judgement   ADL Overall ADL's : Needs assistance/impaired     Grooming: Set up;Sitting   Upper Body Bathing: Set up;Sitting   Lower Body Bathing: Minimal assistance;Sit to/from stand   Upper Body Dressing : Set up;Sitting   Lower Body Dressing: Moderate assistance;Sit to/from stand                 General ADL Comments: pt  has catheter. Did not perform toilet transfer.  Educated her on 3:1 for beside bed or over the toilet. She took a couple of steps forward and backwards with min guard once standing. Pt is very independent.  She can likely don pants, but educated on common household objects to extend reach, if needed (tongs, Environmental education officer).  Husband can assist with socks and shoes. She may want a long netted sponge     Vision         Perception     Praxis      Pertinent Vitals/Pain Pain Assessment: 0-10 Pain Score: 2  Pain Location: L hip Pain Descriptors / Indicators: Discomfort;Sore Pain Intervention(s): Limited activity within patient's tolerance;Monitored during session;Premedicated before session;Repositioned;Ice applied     Hand Dominance     Extremity/Trunk Assessment Upper Extremity Assessment Upper Extremity Assessment: Overall WFL for tasks assessed          Communication Communication Communication: No difficulties   Cognition Arousal/Alertness: Awake/alert Behavior During Therapy: WFL for tasks assessed/performed Overall Cognitive Status: Within Functional Limits for tasks assessed                                     General Comments  Exercises     Shoulder Instructions      Home Living Family/patient expects to be discharged to:: Unsure Living Arrangements: Spouse/significant other                               Additional Comments: pt from [place], son lives here, plans to return home with son or possibly SNF; son's home has flat entrance and pt can stay on main level      Prior Functioning/Environment Level of Independence: Independent                 OT Problem List: Decreased strength;Decreased activity tolerance;Decreased knowledge of use of DME or AE;Pain      OT Treatment/Interventions: Self-care/ADL training;DME and/or AE instruction;Therapeutic activities;Patient/family education    OT Goals(Current goals  can be found in the care plan section) Acute Rehab OT Goals Patient Stated Goal: be as independent as possible OT Goal Formulation: With patient Time For Goal Achievement: 01/05/17 Potential to Achieve Goals: Good ADL Goals Pt Will Perform Grooming: with supervision;standing Pt Will Perform Lower Body Bathing: with supervision;sit to/from stand;with adaptive equipment Pt Will Perform Lower Body Dressing: with supervision;with adaptive equipment;sit to/from stand (pants and underwear) Pt Will Transfer to Toilet: with min guard assist;bedside commode;ambulating Pt Will Perform Toileting - Clothing Manipulation and hygiene: with supervision;sit to/from stand Pt Will Perform Tub/Shower Transfer: Shower transfer;with min guard assist;ambulating;3 in 1 (vs verbalizing sequence)  OT Frequency: Min 2X/week   Barriers to D/C:            Co-evaluation              AM-PAC PT "6 Clicks" Daily Activity     Outcome Measure Help from another person eating meals?: None Help from another person taking care of personal grooming?: A Little Help from another person toileting, which includes using toliet, bedpan, or urinal?: A Little Help from another person bathing (including washing, rinsing, drying)?: A Little Help from another person to put on and taking off regular upper body clothing?: A Little Help from another person to put on and taking off regular lower body clothing?: A Lot 6 Click Score: 18   End of Session    Activity Tolerance: Patient tolerated treatment well Patient left: in chair;with call bell/phone within reach;with chair alarm set  OT Visit Diagnosis: Pain Pain - Right/Left: Left Pain - part of body: Hip                Time: 1107-1130 OT Time Calculation (min): 23 min Charges:  OT General Charges $OT Visit: 1 Visit OT Evaluation $OT Eval Low Complexity: 1 Low G-Codes:     [employer], OTR/L [phone] 12/29/2016  Carlton Sweaney 12/29/2016, 12:52 PM

## 2016-12-30 DIAGNOSIS — B9689 Other specified bacterial agents as the cause of diseases classified elsewhere: Secondary | ICD-10-CM | POA: Diagnosis present

## 2016-12-30 DIAGNOSIS — N390 Urinary tract infection, site not specified: Secondary | ICD-10-CM | POA: Diagnosis present

## 2016-12-30 DIAGNOSIS — B961 Klebsiella pneumoniae [K. pneumoniae] as the cause of diseases classified elsewhere: Secondary | ICD-10-CM

## 2016-12-30 LAB — CBC
HCT: 27.2 % — ABNORMAL LOW (ref 36.0–46.0)
HEMOGLOBIN: 9.2 g/dL — AB (ref 12.0–15.0)
MCH: 30.4 pg (ref 26.0–34.0)
MCHC: 33.8 g/dL (ref 30.0–36.0)
MCV: 89.8 fL (ref 78.0–100.0)
PLATELETS: 199 10*3/uL (ref 150–400)
RBC: 3.03 MIL/uL — AB (ref 3.87–5.11)
RDW: 13.8 % (ref 11.5–15.5)
WBC: 8.9 10*3/uL (ref 4.0–10.5)

## 2016-12-30 LAB — URINE CULTURE: Culture: >=100000 — AB

## 2016-12-30 LAB — BASIC METABOLIC PANEL
ANION GAP: 10 (ref 5–15)
BUN: 40 mg/dL — ABNORMAL HIGH (ref 6–20)
CHLORIDE: 106 mmol/L (ref 101–111)
CO2: 22 mmol/L (ref 22–32)
Calcium: 8.3 mg/dL — ABNORMAL LOW (ref 8.9–10.3)
Creatinine, Ser: 1.98 mg/dL — ABNORMAL HIGH (ref 0.44–1.00)
GFR calc Af Amer: 26 mL/min — ABNORMAL LOW (ref >60)
GFR, EST NON AFRICAN AMERICAN: 22 mL/min — AB (ref >60)
Glucose, Bld: 107 mg/dL — ABNORMAL HIGH (ref 65–99)
POTASSIUM: 3.8 mmol/L (ref 3.5–5.1)
SODIUM: 138 mmol/L (ref 135–145)

## 2016-12-30 MED ORDER — FUROSEMIDE 10 MG/ML IJ SOLN
20.0000 mg | Freq: Once | INTRAMUSCULAR | Status: AC
  Administered 2016-12-30: 20 mg via INTRAVENOUS
  Filled 2016-12-30: qty 2

## 2016-12-30 MED ORDER — ASPIRIN EC 325 MG PO TBEC
325.0000 mg | DELAYED_RELEASE_TABLET | Freq: Every day | ORAL | Status: DC
  Administered 2016-12-30 – 2017-01-02 (×4): 325 mg via ORAL
  Filled 2016-12-30 (×4): qty 1

## 2016-12-30 MED ORDER — SORBITOL 70 % SOLN
30.0000 mL | Freq: Every day | Status: DC | PRN
  Filled 2016-12-30: qty 30

## 2016-12-30 MED ORDER — CEFUROXIME AXETIL 250 MG PO TABS
250.0000 mg | ORAL_TABLET | Freq: Two times a day (BID) | ORAL | Status: DC
  Administered 2016-12-30 – 2017-01-01 (×5): 250 mg via ORAL
  Filled 2016-12-30 (×5): qty 1

## 2016-12-30 NOTE — Progress Notes (Signed)
Occupational Therapy Treatment Patient Details Name: Kathleen Jimenez MRN: [id number] DOB: Jan 10, 1932 Today's Date: 12/30/2016    History of present illness Pt is an 83 year old female with history of hypertension who sustained a mechanical fall leading to her left hip fracture as well as found to have acute blood loss anemia.  Pt currently s/p 2 units of PRBCs and had left Intramedullary nailing surgery for hip fracture on 12/28/16.   OT comments  Able to tolerate standing at sink for grooming today  Follow Up Recommendations  Supervision/Assistance - 24 hour    Equipment Recommendations  3 in 1 bedside commode    Recommendations for Other Services      Precautions / Restrictions Precautions Precautions: Fall Restrictions Weight Bearing Restrictions: No LLE Weight Bearing: Weight bearing as tolerated       Mobility Bed Mobility         Supine to sit: Min assist     General bed mobility comments: for LLE  Transfers   Equipment used: Rolling walker (2 wheeled)   Sit to Stand: Min assist         General transfer comment: light steadying assistance; cues for UE placement    Balance                                           ADL either performed or assessed with clinical judgement   ADL       Grooming: Oral care;Supervision/safety;Standing                                 General ADL Comments: pt only wanted to wash hands and face this am. Stood for teeth.  She had walked to bathroom earlier with nursing     Vision       Perception     Praxis      Cognition Arousal/Alertness: Awake/alert Behavior During Therapy: [facility] for tasks assessed/performed Overall Cognitive Status: Within Functional Limits for tasks assessed                                          Exercises     Shoulder Instructions       General Comments      Pertinent Vitals/ Pain       Pain Score: 2  Pain Location: L hip Pain  Descriptors / Indicators: Discomfort;Sore Pain Intervention(s): Limited activity within patient's tolerance;Monitored during session;Premedicated before session;Repositioned  Home Living                                          Prior Functioning/Environment              Frequency           Progress Toward Goals  OT Goals(current goals can now be found in the care plan section)  Progress towards OT goals: Progressing toward goals     Plan      Co-evaluation                 AM-PAC PT "6 Clicks" Daily Activity     Outcome Measure   Help from another  person eating meals?: None Help from another person taking care of personal grooming?: A Little Help from another person toileting, which includes using toliet, bedpan, or urinal?: A Little Help from another person bathing (including washing, rinsing, drying)?: A Little Help from another person to put on and taking off regular upper body clothing?: A Little Help from another person to put on and taking off regular lower body clothing?: A Lot 6 Click Score: 18    End of Session    OT Visit Diagnosis: Pain Pain - Right/Left: Left Pain - part of body: Hip   Activity Tolerance Patient tolerated treatment well   Patient Left in bed;with call bell/phone within reach   Nurse Communication          Time: 8563-1497 OT Time Calculation (min): 21 min  Charges: OT General Charges $OT Visit: 1 Visit OT Treatments $Self Care/Home Management : 8-22 mins  Lesle Chris, OTR/L [phone] 12/30/2016   [name] 12/30/2016, 10:43 AM

## 2016-12-30 NOTE — Progress Notes (Signed)
Spoke with pt's son Dr. Harlow Asa who selected [facility] for [facility]. DME Shower stool, RW and 3n1 ordered from [facility].

## 2016-12-30 NOTE — Progress Notes (Signed)
   Subjective: 2 Days Post-Op Procedure(s) (LRB): INTRAMEDULLARY (IM) NAIL INTERTROCHANTERIC LEFT (Left) Patient reports pain as mild and moderate.   Patient seen in rounds by Dr. Wynelle Link. Patient is well, but has had some minor complaints of pain in the hip, requiring pain medications We will resume therapy today.  Only able to walk about 7 feet yesterday.  Not ready for discharge at this point. Hopefully will be ready tomorrow for placement Plan is to go Skilled nursing facility after hospital stay.  Objective: Vital signs in last 24 hours: Temp:  [98.1 F (36.7 C)-98.4 F (36.9 C)] 98.4 F (36.9 C) (10/11 2146) Pulse Rate:  [60-72] 72 (10/12 0556) Resp:  [16-18] 17 (10/12 0556) BP: (129-160)/(48-88) 157/65 (10/12 0556) SpO2:  [98 %-100 %] 99 % (10/12 0556) Weight:  [60 kg (132 lb 4.4 oz)] 60 kg (132 lb 4.4 oz) (10/12 0556)  Intake/Output from previous day: 10/11 0701 - 10/12 0700 In: 2546.3 [P.O.:700; I.V.:1846.3] Out: 1650 [Urine:1650] Intake/Output this shift: No intake/output data recorded.   Recent Labs  12/27/16 2133 12/28/16 0551 12/29/16 0522 12/30/16 0522  HGB 10.5* 10.0* 9.8* 9.2*    Recent Labs  12/29/16 0522 12/30/16 0522  WBC 7.0 8.9  RBC 3.25* 3.03*  HCT 29.1* 27.2*  PLT 185 199    Recent Labs  12/29/16 0522 12/30/16 0522  NA 136 138  K 4.0 3.8  CL 105 106  CO2 21* 22  BUN 38* 40*  CREATININE 1.95* 1.98*  GLUCOSE 189* 107*  CALCIUM 8.3* 8.3*   No results for input(s): LABPT, INR in the last 72 hours.  EXAM General - Patient is Alert, Appropriate and Oriented Extremity - Neurovascular intact Sensation intact distally Intact pulses distally Dorsiflexion/Plantar flexion intact Dressing - dressing C/D/I Motor Function - intact, moving foot and toes well on exam.   Past Medical History:  Diagnosis Date  . Hypertension     Assessment/Plan: 2 Days Post-Op Procedure(s) (LRB): INTRAMEDULLARY (IM) NAIL INTERTROCHANTERIC LEFT  (Left) Principal Problem:   Closed intertrochanteric fracture of left hip (HCC) Active Problems:   Hip pain, acute, left   ARF (acute renal failure) (HCC)   HTN (hypertension)   Anemia   Dehydration   Acute blood loss anemia   Chronic kidney disease  Estimated body mass index is 22.71 kg/m as calculated from the following:   Height as of this encounter: 5\' 4"  (1.626 m).   Weight as of this encounter: 60 kg (132 lb 4.4 oz). Up with therapy  DVT Prophylaxis - Eliquis.  Spoke with Pharmacy on GFR low. Likely will need to change from Eliquis. Weight-Bearing as tolerated to left leg Recommend keeping in house today and hopefully ready for placement tomorrow.  Arlee Muslim, PA-C Orthopaedic Surgery 12/30/2016, 7:45 AM

## 2016-12-30 NOTE — Progress Notes (Signed)
PROGRESS NOTE    Kathleen Jimenez  QIH:[id number] DOB: 1932/03/20 DOA: 12/26/2016 PCP: System, Provider Not In    Brief Narrative:  Patient is a pleasant 83 year old female with history of hypertension who sustained a mechanical fall leading to her left hip fracture. Patient admitted and seen in consultation by orthopedics. Patient for probable ORIF tomorrow 12/28/2016. On admission patient noted to have a elevated renal function concern for acute kidney disease versus chronic kidney disease, anemic with a hemoglobin of 7.7. Patient s/p 2 units packed red blood cells.Patient for ORIF today 12/28/2016.   Assessment & Plan:   Principal Problem:   Closed intertrochanteric fracture of left hip (HCC) Active Problems:   Hip pain, acute, left   ARF (acute renal failure) (HCC)   HTN (hypertension)   Anemia   Dehydration   Acute blood loss anemia   Chronic kidney disease   UTI due to Klebsiella species  #1 closed intertrochanteric left hip fracture Secondary to mechanical fall. Patient denies any syncope, no lightheadedness, no dizziness, no chest pain, no presyncopal episodes. Patient noted to have significant ecchymosis around the left hip likely secondary to bleeding. Pain currently controlled. Patient has been seen in consultation by orthopedics and underwent IM nailing to left hip 12/28/2016 per Dr. Wynelle Link. PT/OT. Per orthopedics.  #2 acute blood loss anemia Patient noted to be anemic on admission with a hemoglobin of 7.7. Hemoglobin dropped as low as 6.7. Patient status post 2 units packed red blood cells hemoglobin currently at 9.2 postoperatively. Patient noted with a large ecchymotic area around the left hip around fracture site which is likely etiology of patient's anemia. Patient denies any overt bleeding. FOBT pending. Continue PPI. Anemia panel consistent with anemia of chronic disease. Follow H&H.  #3 acute renal failure versus chronic kidney disease Baseline unknown. Patient noted  on admission to have a creatinine of 2.31. Patient noted to be on ACE inhibitor, ARB and NSAIDs. Fractional excretion of sodium 0.24%.  Urinalysis with some proteinuria. Renal ultrasound with increased renal echogenicity consistent with changes of medical renal disease. Negative for hydronephrosis. Left renal cysts measuring up to 2.3 and 1.6 cm. Renal function seems to be slowly trending down. Urine protein to creatinine ratio 1.95 which is elevated. Awaiting fax from PCPs office. Saline lock IV fluids. Avoid nephrotoxins. May need outpatient referral to nephrology. Follow.  #4 dehydration Improved. Patient with good oral intake. Saline lock IV fluids.   #5 hypertension Blood pressure improving on current regimen. Due to patient's renal function patient's ARB, diuretics, ACE inhibitor have been discontinued. Patient placed on Norvasc 10 mg daily, Coreg 3.125 mg twice a day, hydralazine 25 mg by mouth every 8 hours. Follow and titrate as needed.  #6 Klebsiella pneumonia UTI Urinalysis consistent with a UTI. Urine cultures positive for Klebsiella pneumonia. Sensitive to cephalosporins. Discontinue IV Rocephin and placed on oral Ceftin to complete a course of antibiotic treatment.     DVT prophylaxis: Eliquis per orthopedics.  Code Status: Full Family Communication: Updated patient. No family at bedside. Disposition Plan:  Home with home health versus skilled nursing facility when okay with orthopedics.   Consultants:   Orthopedics: Dr. Wynelle Link 12/27/2016  Procedures:   Plain films of the left hip 12/26/2016  Chest x-ray 12/26/2016   renal ultrasound 12/27/2016  2 units packed red blood cells 12/27/2016  IM nailing 12/28/2016 per Dr.Aluisio  Antimicrobials:   IV Rocephin 12/27/2016>>>> 12/30/2016  Ceftin 12/30/2016>>>>    Subjective: Patient sitting up in chair. Patient denies  any shortness of breath. No chest pain. Patient stated ambulated in the hallway with PT today.  Patient with some left hip pain.   Objective: Vitals:   12/29/16 1716 12/29/16 2146 12/30/16 0556 12/30/16 0807  BP: (!) 157/67 (!) 160/70 (!) 157/65 (!) 168/64  Pulse: 60 72 72 66  Resp:  17 17   Temp:  98.4 F (36.9 C)    TempSrc:  Oral Oral   SpO2:  99% 99%   Weight:   60 kg (132 lb 4.4 oz)   Height:        Intake/Output Summary (Last 24 hours) at 12/30/16 1112 Last data filed at 12/30/16 0909  Gross per 24 hour  Intake          2316.25 ml  Output             1700 ml  Net           616.25 ml   Filed Weights   12/26/16 2100 12/27/16 2100 12/30/16 0556  Weight: 55 kg (121 lb 4.1 oz) 57 kg (125 lb 10.6 oz) 60 kg (132 lb 4.4 oz)    Examination:  General exam: NAD.Sitting up in chair. Respiratory system: Clear to auscultation. No wheezes, no crackles, no rhonchi. Respiratory effort normal. Cardiovascular system: S1 & S2 heard, RRR. No JVD, murmurs, rubs, gallops or clicks. No pedal edema. Gastrointestinal system: Abdomen is Soft, nontender, nondistended, positive bowel sounds. No hepatosplenomegaly.  Central nervous system: Alert and oriented. No focal neurological deficits. Extremities: Left lower extremity with some swelling around the hip, ecchymosis on lateral left hip extending posteriorly as well as on the inner thigh/groin.  Skin: No rashes, lesions or ulcers Psychiatry: Judgement and insight appear normal. Mood & affect appropriate.     Data Reviewed: I have personally reviewed following labs and imaging studies  CBC:  Recent Labs Lab 12/26/16 1749 12/27/16 0602 12/27/16 2133 12/28/16 0551 12/29/16 0522 12/30/16 0522  WBC 9.4 7.1  --  8.1 7.0 8.9  NEUTROABS  --  4.3  --  5.5 6.6  --   HGB 7.7* 6.7* 10.5* 10.0* 9.8* 9.2*  HCT 22.3* 20.1* 31.0* 29.5* 29.1* 27.2*  MCV 90.7 92.2  --  89.9 89.5 89.8  PLT 199 184  --  183 185 497   Basic Metabolic Panel:  Recent Labs Lab 12/26/16 1749 12/26/16 1942 12/27/16 0602 12/28/16 0551 12/29/16 0522  12/30/16 0522  NA 139  --  140 141 136 138  K 3.5  --  3.6 3.9 4.0 3.8  CL 106  --  109 109 105 106  CO2 23  --  22 23 21* 22  GLUCOSE 103*  --  95 103* 189* 107*  BUN 52*  --  48* 43* 38* 40*  CREATININE 2.31*  --  2.22* 2.24* 1.95* 1.98*  CALCIUM 8.6*  --  8.2* 8.5* 8.3* 8.3*  MG  --  2.1  --   --   --   --    GFR: Estimated Creatinine Clearance: 18.6 mL/min (A) (by C-G formula based on SCr of 1.98 mg/dL (H)). Liver Function Tests:  Recent Labs Lab 12/26/16 1749  AST 23  ALT 19  ALKPHOS 67  BILITOT 0.9  PROT 6.2*  ALBUMIN 3.5   No results for input(s): LIPASE, AMYLASE in the last 168 hours. No results for input(s): AMMONIA in the last 168 hours. Coagulation Profile:  Recent Labs Lab 12/26/16 1749  INR 1.04   Cardiac Enzymes: No results for  input(s): CKTOTAL, CKMB, CKMBINDEX, TROPONINI in the last 168 hours. BNP (last 3 results) No results for input(s): PROBNP in the last 8760 hours. HbA1C: No results for input(s): HGBA1C in the last 72 hours. CBG: No results for input(s): GLUCAP in the last 168 hours. Lipid Profile: No results for input(s): CHOL, HDL, LDLCALC, TRIG, CHOLHDL, LDLDIRECT in the last 72 hours. Thyroid Function Tests: No results for input(s): TSH, T4TOTAL, FREET4, T3FREE, THYROIDAB in the last 72 hours. Anemia Panel: No results for input(s): VITAMINB12, FOLATE, FERRITIN, TIBC, IRON, RETICCTPCT in the last 72 hours. Sepsis Labs: No results for input(s): PROCALCITON, LATICACIDVEN in the last 168 hours.  Recent Results (from the past 240 hour(s))  Culture, Urine     Status: Abnormal   Collection Time: 12/26/16  7:00 PM  Result Value Ref Range Status   Specimen Description URINE, RANDOM  Final   Special Requests NONE  Final   Culture (A)  Final    >=100,000 COLONIES/mL KLEBSIELLA PNEUMONIAE >=100,000 COLONIES/mL KLEBSIELLA PNEUMONIAE Susceptibility patterns are not the same. Performed at [facility], [place] [address], [place], [place]  [address]    Report Status 12/30/2016 FINAL  Final   Organism ID, Bacteria KLEBSIELLA PNEUMONIAE (A)  Final   Organism ID, Bacteria KLEBSIELLA PNEUMONIAE (A)  Final      Susceptibility   Klebsiella pneumoniae - MIC*    AMPICILLIN >=32 RESISTANT Resistant     CEFAZOLIN <=4 SENSITIVE Sensitive     CEFTRIAXONE <=1 SENSITIVE Sensitive     CIPROFLOXACIN <=0.25 SENSITIVE Sensitive     GENTAMICIN <=1 SENSITIVE Sensitive     IMIPENEM 2 SENSITIVE Sensitive     NITROFURANTOIN 128 RESISTANT Resistant     TRIMETH/SULFA <=20 SENSITIVE Sensitive     AMPICILLIN/SULBACTAM 16 INTERMEDIATE Intermediate     Extended ESBL NEGATIVE Sensitive    Klebsiella pneumoniae - MIC*    AMPICILLIN RESISTANT Resistant     CEFAZOLIN <=4 SENSITIVE Sensitive     CEFTRIAXONE <=1 SENSITIVE Sensitive     CIPROFLOXACIN <=0.25 SENSITIVE Sensitive     GENTAMICIN <=1 SENSITIVE Sensitive     IMIPENEM <=0.25 SENSITIVE Sensitive     NITROFURANTOIN <=16 SENSITIVE Sensitive     TRIMETH/SULFA <=20 SENSITIVE Sensitive     AMPICILLIN/SULBACTAM 4 SENSITIVE Sensitive     PIP/TAZO <=4 SENSITIVE Sensitive     Extended ESBL NEGATIVE Sensitive     * >=100,000 COLONIES/mL KLEBSIELLA PNEUMONIAE    >=100,000 COLONIES/mL KLEBSIELLA PNEUMONIAE  Surgical PCR screen     Status: None   Collection Time: 12/27/16  6:49 AM  Result Value Ref Range Status   MRSA, PCR NEGATIVE NEGATIVE Final   Staphylococcus aureus NEGATIVE NEGATIVE Final    Comment: (NOTE) The Xpert SA Assay (FDA approved for NASAL specimens in patients 20 years of age and older), is one component of a comprehensive surveillance program. It is not intended to diagnose infection nor to guide or monitor treatment.          Radiology Studies: Dg C-arm 1-60 Min-no Report  Result Date: 12/28/2016 CLINICAL DATA:  Internal fixation of left hip fracture. Initial encounter. EXAM: OPERATIVE LEFT HIP (WITH PELVIS IF PERFORMED) 2 VIEWS TECHNIQUE: Fluoroscopic spot image(s) were  submitted for interpretation post-operatively. COMPARISON:  Left hip radiographs performed 12/26/2016 FINDINGS: Three fluoroscopic C-arm images are provided from the OR, demonstrating placement of a left hip screw transfixing the intertrochanteric fracture in near anatomic alignment. No new fracture is seen. The left femoral head remains seated at the acetabulum.  IMPRESSION: Status post internal fixation of left femoral intertrochanteric fracture in near anatomic alignment. No new fracture seen. Electronically Signed   By: Garald Balding M.D.   On: 12/28/2016 22:01   Dg Hip Operative Unilat W Or W/o Pelvis Left  Result Date: 12/28/2016 CLINICAL DATA:  Internal fixation of left hip fracture. Initial encounter. EXAM: OPERATIVE LEFT HIP (WITH PELVIS IF PERFORMED) 2 VIEWS TECHNIQUE: Fluoroscopic spot image(s) were submitted for interpretation post-operatively. COMPARISON:  Left hip radiographs performed 12/26/2016 FINDINGS: Three fluoroscopic C-arm images are provided from the OR, demonstrating placement of a left hip screw transfixing the intertrochanteric fracture in near anatomic alignment. No new fracture is seen. The left femoral head remains seated at the acetabulum. IMPRESSION: Status post internal fixation of left femoral intertrochanteric fracture in near anatomic alignment. No new fracture seen. Electronically Signed   By: Garald Balding M.D.   On: 12/28/2016 22:01        Scheduled Meds: . amLODipine  10 mg Oral Daily  . apixaban  2.5 mg Oral BID  . carvedilol  3.125 mg Oral BID WC  . cefUROXime  250 mg Oral BID WC  . hydrALAZINE  25 mg Oral Q8H  . pantoprazole  40 mg Oral Q0600  . polyethylene glycol  17 g Oral Daily  . senna-docusate  1 tablet Oral BID   Continuous Infusions:    LOS: 4 days    Time spent: 20 minutes    Enola Siebers, MD [facility] Pager [phone]  If 7PM-7AM, please contact night-coverage [email or web address] Password [email or web address] 12/30/2016, 11:12 AM

## 2016-12-30 NOTE — Progress Notes (Signed)
Physical Therapy Treatment Patient Details Name: Kathleen Jimenez MRN: [id number] DOB: 01/26/1932 Today's Date: 12/30/2016    History of Present Illness Pt is an 83 year old female with history of hypertension who sustained a mechanical fall leading to her left hip fracture as well as found to have acute blood loss anemia.  Pt currently s/p 2 units of PRBCs and had left Intramedullary nailing surgery for hip fracture on 12/28/16.    PT Comments    Pt assisted with ambulating in hallway and performed LE exercises in recliner.  Pt reports the plan is for d/c home with son possibly Sunday.  Follow Up Recommendations  Home health PT;Supervision/Assistance - 24 hour     Equipment Recommendations  Rolling walker with 5" wheels (youth)    Recommendations for Other Services       Precautions / Restrictions Precautions Precautions: Fall Restrictions LLE Weight Bearing: Weight bearing as tolerated    Mobility  Bed Mobility Overal bed mobility: Needs Assistance Bed Mobility: Supine to Sit     Supine to sit: Min guard;HOB elevated     General bed mobility comments: verbal cues for self assist  Transfers Overall transfer level: Needs assistance Equipment used: Rolling walker (2 wheeled) Transfers: Sit to/from Stand Sit to Stand: Min guard         General transfer comment: verbal cues for UE and LE positioning  Ambulation/Gait Ambulation/Gait assistance: Min guard Ambulation Distance (Feet): 20 Feet Assistive device: Rolling walker (2 wheeled) Gait Pattern/deviations: Step-to pattern;Decreased stance time - left;Antalgic     General Gait Details: verbal cues for sequence, RW positioning, step length, posture, distance limited by fatigue, recliner following   Stairs            Wheelchair Mobility    Modified Rankin (Stroke Patients Only)       Balance                                            Cognition Arousal/Alertness:  Awake/alert Behavior During Therapy: WFL for tasks assessed/performed Overall Cognitive Status: Within Functional Limits for tasks assessed                                        Exercises Total Joint Exercises Ankle Circles/Pumps: AROM;Both;10 reps Quad Sets: AROM;Both;10 reps Short Arc Quad: AROM;Left;10 reps Heel Slides: AAROM;Left;10 reps Hip ABduction/ADduction: AROM;Left;10 reps    General Comments        Pertinent Vitals/Pain Pain Assessment: 0-10 Pain Score: 2  Pain Location: L hip Pain Descriptors / Indicators: Discomfort;Sore Pain Intervention(s): Limited activity within patient's tolerance;Monitored during session;Premedicated before session;Repositioned    Home Living                      Prior Function            PT Goals (current goals can now be found in the care plan section) Progress towards PT goals: Progressing toward goals    Frequency    Min 3X/week      PT Plan Current plan remains appropriate    Co-evaluation              AM-PAC PT "6 Clicks" Daily Activity  Outcome Measure  Difficulty turning over in bed (including adjusting bedclothes, sheets and blankets)?: A  Little Difficulty moving from lying on back to sitting on the side of the bed? : A Lot Difficulty sitting down on and standing up from a chair with arms (e.g., wheelchair, bedside commode, etc,.)?: Unable Help needed moving to and from a bed to chair (including a wheelchair)?: A Little Help needed walking in hospital room?: A Little Help needed climbing 3-5 steps with a railing? : A Lot 6 Click Score: 14    End of Session Equipment Utilized During Treatment: Gait belt Activity Tolerance: Patient limited by fatigue Patient left: in chair;with call bell/phone within reach   PT Visit Diagnosis: Other abnormalities of gait and mobility (R26.89)     Time: 2103-1281 PT Time Calculation (min) (ACUTE ONLY): 16 min  Charges:  $Gait Training: 8-22  mins                    G Codes:      Carmelia Bake, PT, DPT 12/30/2016 Pager: [phone]  York Ram E 12/30/2016, 2:00 PM

## 2016-12-30 NOTE — Progress Notes (Addendum)
CSW consulted to assist w/ d/c plan: CSW met with patient, son and spouse at bedside. CSW explain role and reason for visit-discuss discharge planning. Patient and family are agreeable for the patient to rehab at home. Patient son reports his home can accommodate the patient and allow her to rehab without having to use stairs etc.Patient son inquired about Home Health requirements/Equipment/services. CSW informed RNCM to follow up with patient and family.  No other CSW needs identified at this time.   Kathrin Greathouse, Latanya Presser, MSW Clinical Social Worker  [phone] 12/30/2016  9:29 AM

## 2016-12-31 DIAGNOSIS — N184 Chronic kidney disease, stage 4 (severe): Secondary | ICD-10-CM

## 2016-12-31 DIAGNOSIS — N170 Acute kidney failure with tubular necrosis: Secondary | ICD-10-CM

## 2016-12-31 DIAGNOSIS — S72142P Displaced intertrochanteric fracture of left femur, subsequent encounter for closed fracture with malunion: Secondary | ICD-10-CM

## 2016-12-31 LAB — BASIC METABOLIC PANEL
Anion gap: 8 (ref 5–15)
BUN: 40 mg/dL — AB (ref 6–20)
CHLORIDE: 107 mmol/L (ref 101–111)
CO2: 23 mmol/L (ref 22–32)
Calcium: 8.6 mg/dL — ABNORMAL LOW (ref 8.9–10.3)
Creatinine, Ser: 1.9 mg/dL — ABNORMAL HIGH (ref 0.44–1.00)
GFR calc Af Amer: 27 mL/min — ABNORMAL LOW (ref >60)
GFR calc non Af Amer: 23 mL/min — ABNORMAL LOW (ref >60)
GLUCOSE: 101 mg/dL — AB (ref 65–99)
POTASSIUM: 3.8 mmol/L (ref 3.5–5.1)
Sodium: 138 mmol/L (ref 135–145)

## 2016-12-31 LAB — CBC
HEMATOCRIT: 28.6 % — AB (ref 36.0–46.0)
HEMOGLOBIN: 9.6 g/dL — AB (ref 12.0–15.0)
MCH: 30.5 pg (ref 26.0–34.0)
MCHC: 33.6 g/dL (ref 30.0–36.0)
MCV: 90.8 fL (ref 78.0–100.0)
Platelets: 233 10*3/uL (ref 150–400)
RBC: 3.15 MIL/uL — AB (ref 3.87–5.11)
RDW: 14.2 % (ref 11.5–15.5)
WBC: 9.3 10*3/uL (ref 4.0–10.5)

## 2016-12-31 MED ORDER — CARVEDILOL 6.25 MG PO TABS
6.2500 mg | ORAL_TABLET | Freq: Two times a day (BID) | ORAL | Status: DC
  Filled 2016-12-31: qty 1

## 2016-12-31 MED ORDER — HYDRALAZINE HCL 50 MG PO TABS
50.0000 mg | ORAL_TABLET | Freq: Three times a day (TID) | ORAL | Status: DC
  Administered 2016-12-31: 15:00:00 50 mg via ORAL
  Filled 2016-12-31: qty 1

## 2016-12-31 MED ORDER — HYDROCODONE-ACETAMINOPHEN 5-325 MG PO TABS
1.0000 | ORAL_TABLET | ORAL | Status: DC | PRN
  Filled 2016-12-31: qty 1

## 2016-12-31 MED ORDER — HYDRALAZINE HCL 25 MG PO TABS
25.0000 mg | ORAL_TABLET | Freq: Three times a day (TID) | ORAL | Status: DC
Start: 2016-12-31 — End: 2017-01-02
  Administered 2016-12-31 – 2017-01-02 (×5): 25 mg via ORAL
  Filled 2016-12-31 (×5): qty 1

## 2016-12-31 MED ORDER — CARVEDILOL 3.125 MG PO TABS
3.1250 mg | ORAL_TABLET | Freq: Once | ORAL | Status: AC
  Administered 2016-12-31: 14:00:00 3.125 mg via ORAL
  Filled 2016-12-31: qty 1

## 2016-12-31 MED ORDER — CARVEDILOL 6.25 MG PO TABS
6.2500 mg | ORAL_TABLET | Freq: Two times a day (BID) | ORAL | Status: DC
  Administered 2017-01-01 – 2017-01-02 (×2): 6.25 mg via ORAL
  Filled 2016-12-31: qty 1

## 2016-12-31 NOTE — Progress Notes (Signed)
PROGRESS NOTE    Kathleen Jimenez  OHY:[id number] DOB: Jun 09, 1931 DOA: 12/26/2016 PCP: System, Provider Not In    Brief Narrative:   Patient is a pleasant 83 year old female with history of hypertension who sustained a mechanical fall leading to her left hip fracture. Patient admitted and seen in consultation by orthopedics. Patient for probable ORIF tomorrow 12/28/2016. On admission patient noted to have a elevated renal function concern for acute kidney disease versus chronic kidney disease, anemic with a hemoglobin of 7.7. Patient s/p 2 units packed red blood cells.Patient for ORIF today 12/28/2016.   Subjective:  Patient in bed, appears comfortable, denies any headache, no fever, no chest pain or pressure, no shortness of breath , no abdominal pain. No focal weakness.     Assessment & Plan:    #1 Mechanical Fall with closed intertrochanteric left hip fracture - seen by orthopedics and underwent intramedullary nail placement in the left hip on 12/28/2016 by Dr.Alucio, tolerated the procedure well with minimal perioperative blood loss related anemia requiring 2 units of packed RBC transfusion, she is weightbearing as tolerated, DVT prophylaxis now aspirin daily per orthopedics, Eliquis was stopped due to low GFR.    #2 acute blood loss anemia Patient noted to be anemic on admission with a hemoglobin of 7.7. Hemoglobin dropped as low as 6.7. Patient status post 2 units packed red blood cells hemoglobin currently at 9.2 postoperatively. Patient noted with a large ecchymotic area around the left hip around fracture site which is likely etiology of patient's anemia. Continue to monitor H&H closely now has stabilized.  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 No blood work by PCP in the last 4-5 years per PCPs fax note, she was also on ACE, ARB, NSAID and HCTZ, all offending medications held, renal ultrasound does not show hydronephrosis or acute changes does have left kidney cyst, UA shows large proteinuria. At  this point hold nephrotoxins, renal function is stable and will be monitored, will benefit from outpatient nephrology follow-up and once renal function is stable likely low-dose ACE inhibitor.   #4 dehydration Improved. Patient with good oral intake. Saline lock IV fluids.   #5 hypertension currently on Norvasc, have increased Coreg and hydralazine for better control we'll continue to monitor.    #6 Klebsiella pneumonia UTI Urinalysis consistent with a UTI. Urine cultures positive for Klebsiella pneumonia. Sensitive to cephalosporins. Discontinue IV Rocephin and placed on oral Ceftin to complete a course of antibiotic treatment.     DVT prophylaxis: Eliquis per orthopedics.  Code Status: Full Family Communication: Updated patient. No family at bedside. Disposition Plan:  Home with home health versus skilled nursing facility when okay with orthopedics.   Consultants:   Orthopedics: Dr. Wynelle Link 12/27/2016  Procedures:   Plain films of the left hip 12/26/2016  Chest x-ray 12/26/2016   renal ultrasound 12/27/2016  2 units packed red blood cells 12/27/2016  IM nailing 12/28/2016 per Dr.Aluisio  Antimicrobials:   IV Rocephin 12/27/2016>>>> 12/30/2016  Ceftin 12/30/2016>>>>     Objective: Vitals:   12/30/16 1415 12/30/16 1702 12/30/16 2221 12/31/16 0558  BP: (!) 138/57 (!) 154/72 (!) 156/63 (!) 180/75  Pulse: 78 72 73 79  Resp: 16  16 20   Temp: 97.6 F (36.4 C)  98.7 F (37.1 C) 98.3 F (36.8 C)  TempSrc: Oral  Oral Oral  SpO2: 100%  98% 97%  Weight:    56.7 kg (125 lb 1.6 oz)  Height:        Intake/Output Summary (Last 24  hours) at 12/31/16 1243 Last data filed at 12/31/16 1100  Gross per 24 hour  Intake             1080 ml  Output             3350 ml  Net            -2270 ml   Filed Weights   12/27/16 2100 12/30/16 0556 12/31/16 0558  Weight: 57 kg (125 lb 10.6 oz) 60 kg (132 lb 4.4 oz) 56.7 kg (125 lb 1.6 oz)    Examination:  Awake Alert, Oriented  X 3, No new F.N deficits, Normal affect [place].AT,PERRAL Supple Neck,No JVD, No cervical lymphadenopathy appriciated.  Symmetrical Chest wall movement, Good air movement bilaterally, CTAB RRR,No Gallops,Rubs or new Murmurs, No Parasternal Heave +ve B.Sounds, Abd Soft, No tenderness, No organomegaly appriciated, No rebound - guarding or rigidity. No Cyanosis, Clubbing or edema, left OpSite noted, some bruising around the thigh area and left hip     Data Reviewed: I have personally reviewed following labs and imaging studies  CBC:  Recent Labs Lab 12/27/16 0602 12/27/16 2133 12/28/16 0551 12/29/16 0522 12/30/16 0522 12/31/16 0507  WBC 7.1  --  8.1 7.0 8.9 9.3  NEUTROABS 4.3  --  5.5 6.6  --   --   HGB 6.7* 10.5* 10.0* 9.8* 9.2* 9.6*  HCT 20.1* 31.0* 29.5* 29.1* 27.2* 28.6*  MCV 92.2  --  89.9 89.5 89.8 90.8  PLT 184  --  183 185 199 175   Basic Metabolic Panel:  Recent Labs Lab 12/26/16 1942 12/27/16 0602 12/28/16 0551 12/29/16 0522 12/30/16 0522 12/31/16 0507  NA  --  140 141 136 138 138  K  --  3.6 3.9 4.0 3.8 3.8  CL  --  109 109 105 106 107  CO2  --  22 23 21* 22 23  GLUCOSE  --  95 103* 189* 107* 101*  BUN  --  48* 43* 38* 40* 40*  CREATININE  --  2.22* 2.24* 1.95* 1.98* 1.90*  CALCIUM  --  8.2* 8.5* 8.3* 8.3* 8.6*  MG 2.1  --   --   --   --   --    GFR: Estimated Creatinine Clearance: 19.4 mL/min (A) (by C-G formula based on SCr of 1.9 mg/dL (H)). Liver Function Tests:  Recent Labs Lab 12/26/16 1749  AST 23  ALT 19  ALKPHOS 67  BILITOT 0.9  PROT 6.2*  ALBUMIN 3.5   No results for input(s): LIPASE, AMYLASE in the last 168 hours. No results for input(s): AMMONIA in the last 168 hours. Coagulation Profile:  Recent Labs Lab 12/26/16 1749  INR 1.04   Cardiac Enzymes: No results for input(s): CKTOTAL, CKMB, CKMBINDEX, TROPONINI in the last 168 hours. BNP (last 3 results) No results for input(s): PROBNP in the last 8760 hours. HbA1C: No results  for input(s): HGBA1C in the last 72 hours. CBG: No results for input(s): GLUCAP in the last 168 hours. Lipid Profile: No results for input(s): CHOL, HDL, LDLCALC, TRIG, CHOLHDL, LDLDIRECT in the last 72 hours. Thyroid Function Tests: No results for input(s): TSH, T4TOTAL, FREET4, T3FREE, THYROIDAB in the last 72 hours. Anemia Panel: No results for input(s): VITAMINB12, FOLATE, FERRITIN, TIBC, IRON, RETICCTPCT in the last 72 hours. Sepsis Labs: No results for input(s): PROCALCITON, LATICACIDVEN in the last 168 hours.  Recent Results (from the past 240 hour(s))  Culture, Urine     Status: Abnormal   Collection Time:  12/26/16  7:00 PM  Result Value Ref Range Status   Specimen Description URINE, RANDOM  Final   Special Requests NONE  Final   Culture (A)  Final    >=100,000 COLONIES/mL KLEBSIELLA PNEUMONIAE >=100,000 COLONIES/mL KLEBSIELLA PNEUMONIAE Susceptibility patterns are not the same. Performed at [facility], [place] [address] [place], [place] [address]    Report Status 12/30/2016 FINAL  Final   Organism ID, Bacteria KLEBSIELLA PNEUMONIAE (A)  Final   Organism ID, Bacteria KLEBSIELLA PNEUMONIAE (A)  Final      Susceptibility   Klebsiella pneumoniae - MIC*    AMPICILLIN >=32 RESISTANT Resistant     CEFAZOLIN <=4 SENSITIVE Sensitive     CEFTRIAXONE <=1 SENSITIVE Sensitive     CIPROFLOXACIN <=0.25 SENSITIVE Sensitive     GENTAMICIN <=1 SENSITIVE Sensitive     IMIPENEM 2 SENSITIVE Sensitive     NITROFURANTOIN 128 RESISTANT Resistant     TRIMETH/SULFA <=20 SENSITIVE Sensitive     AMPICILLIN/SULBACTAM 16 INTERMEDIATE Intermediate     Extended ESBL NEGATIVE Sensitive    Klebsiella pneumoniae - MIC*    AMPICILLIN RESISTANT Resistant     CEFAZOLIN <=4 SENSITIVE Sensitive     CEFTRIAXONE <=1 SENSITIVE Sensitive     CIPROFLOXACIN <=0.25 SENSITIVE Sensitive     GENTAMICIN <=1 SENSITIVE Sensitive     IMIPENEM <=0.25 SENSITIVE Sensitive     NITROFURANTOIN <=16 SENSITIVE  Sensitive     TRIMETH/SULFA <=20 SENSITIVE Sensitive     AMPICILLIN/SULBACTAM 4 SENSITIVE Sensitive     PIP/TAZO <=4 SENSITIVE Sensitive     Extended ESBL NEGATIVE Sensitive     * >=100,000 COLONIES/mL KLEBSIELLA PNEUMONIAE    >=100,000 COLONIES/mL KLEBSIELLA PNEUMONIAE  Surgical PCR screen     Status: None   Collection Time: 12/27/16  6:49 AM  Result Value Ref Range Status   MRSA, PCR NEGATIVE NEGATIVE Final   Staphylococcus aureus NEGATIVE NEGATIVE Final    Comment: (NOTE) The Xpert SA Assay (FDA approved for NASAL specimens in patients 7 years of age and older), is one component of a comprehensive surveillance program. It is not intended to diagnose infection nor to guide or monitor treatment.       Radiology Studies: No results found.   Scheduled Meds: . amLODipine  10 mg Oral Daily  . aspirin EC  325 mg Oral Daily  . carvedilol  3.125 mg Oral BID WC  . cefUROXime  250 mg Oral BID WC  . hydrALAZINE  25 mg Oral Q8H  . pantoprazole  40 mg Oral Q0600  . polyethylene glycol  17 g Oral Daily  . senna-docusate  1 tablet Oral BID   Continuous Infusions:    LOS: 5 days    Time spent: 40 minutes   Signature  Lala Lund M.D on 12/31/2016 at 12:43 PM  Between 7am to 7pm - Pager - [phone] ( page via [email or web address], text pages only, please mention full 10 digit call back number).  After 7pm go to [email or web address] - password [facility]

## 2016-12-31 NOTE — Progress Notes (Signed)
Physical Therapy Treatment Patient Details Name: Kathleen Jimenez MRN: [id number] DOB: Nov 23, 1931 Today's Date: 12/31/2016    History of Present Illness Pt had IM nail of left hip on 12/28/2016.  She is progressing well post op.     PT Comments    Pt participated well with all PT exercise and gait training.  Noted today some decreased left leg length that pt is adjusting to in gait.  She tends to lean forward on youth walker and may now be ready to use regular height walker as she puts more weight through left leg as she  works to stand up straighter.  Will assess this next session.   Follow Up Recommendations  Home health PT     Equipment Recommendations  Rolling walker with 5" wheels (pt may be ok with regular height walker to encourage posture)    Recommendations for Other Services       Precautions / Restrictions Precautions Precautions: Fall Restrictions LLE Weight Bearing: Weight bearing as tolerated    Mobility  Bed Mobility Overal bed mobility: Needs Assistance Bed Mobility: Supine to Sit     Supine to sit: Min assist     General bed mobility comments: to help lift left leg as she moves to left side of bed   Transfers Overall transfer level: Needs assistance Equipment used: Rolling walker (2 wheeled) Transfers: Sit to/from Stand Sit to Stand: Supervision         General transfer comment: verbal cues to push up from bed/ BSC and to stand up straight while walking   Ambulation/Gait Ambulation/Gait assistance: Min guard Ambulation Distance (Feet): 50 Feet Assistive device: Rolling walker (2 wheeled) Gait Pattern/deviations: Step-to pattern     General Gait Details: verbal cues to stand up straigh as pt tends to lean forward over walker.     Stairs            Wheelchair Mobility    Modified Rankin (Stroke Patients Only)       Balance                                            Cognition Arousal/Alertness:  Awake/alert Behavior During Therapy: WFL for tasks assessed/performed Overall Cognitive Status: Within Functional Limits for tasks assessed                                        Exercises Total Joint Exercises Ankle Circles/Pumps: AROM;Both Quad Sets: AROM;Both;10 reps Gluteal Sets: 10 reps Short Arc Quad: AROM;Right;Left;10 reps Hip ABduction/ADduction: Right;Left Other Exercises Other Exercises: standing glute sets  Other Exercises: standing side to side and front to back weight shifts Other Exercises: alternating one hand off walker, then both hands off with cues to stand up straight and engage core     General Comments General comments (skin integrity, edema, etc.): LLE appears to be shorter than RLE       Pertinent Vitals/Pain Pain Assessment: 0-10 Pain Score: 0-No pain    Home Living                      Prior Function            PT Goals (current goals can now be found in the care plan section) Acute Rehab PT Goals Patient  Stated Goal: be as independent as possible     Frequency    Min 3X/week      PT Plan Current plan remains appropriate    Co-evaluation              AM-PAC PT "6 Clicks" Daily Activity  Outcome Measure  Difficulty turning over in bed (including adjusting bedclothes, sheets and blankets)?: A Little Difficulty moving from lying on back to sitting on the side of the bed? : A Lot Difficulty sitting down on and standing up from a chair with arms (e.g., wheelchair, bedside commode, etc,.)?: Unable Help needed moving to and from a bed to chair (including a wheelchair)?: A Little Help needed walking in hospital room?: A Little Help needed climbing 3-5 steps with a railing? : A Lot 6 Click Score: 14    End of Session Equipment Utilized During Treatment: Gait belt Activity Tolerance: Patient limited by fatigue Patient left: in chair;with call bell/phone within reach   PT Visit Diagnosis: Other abnormalities  of gait and mobility (R26.89)     Time: 5726-2035 PT Time Calculation (min) (ACUTE ONLY): 35 min  Charges:  $Gait Training: 8-22 mins $Therapeutic Exercise: 8-22 mins                    G Codes:      Teresa K. Owens Shark, PT    Norwood Levo 12/31/2016, 10:21 AM

## 2016-12-31 NOTE — Progress Notes (Signed)
   Subjective: 3 Days Post-Op Procedure(s) (LRB): INTRAMEDULLARY (IM) NAIL INTERTROCHANTERIC LEFT (Left)  Pt doing fairly well Mild pain/soreness with therapy but overall improving Denies any new symptoms or issues Patient reports pain as mild.  Objective:   VITALS:   Vitals:   12/30/16 2221 12/31/16 0558  BP: (!) 156/63 (!) 180/75  Pulse: 73 79  Resp: 16 20  Temp: 98.7 F (37.1 C) 98.3 F (36.8 C)  SpO2: 98% 97%    Left hip incision healing well nv intact distally No rashes or edema distally  LABS  Recent Labs  12/29/16 0522 12/30/16 0522 12/31/16 0507  HGB 9.8* 9.2* 9.6*  HCT 29.1* 27.2* 28.6*  WBC 7.0 8.9 9.3  PLT 185 199 233     Recent Labs  12/29/16 0522 12/30/16 0522 12/31/16 0507  NA 136 138 138  K 4.0 3.8 3.8  BUN 38* 40* 40*  CREATININE 1.95* 1.98* 1.90*  GLUCOSE 189* 107* 101*     Assessment/Plan: 3 Days Post-Op Procedure(s) (LRB): INTRAMEDULLARY (IM) NAIL INTERTROCHANTERIC LEFT (Left) Continue PT/OT Pain management as needed Plan is to d/c home in 1-2 days once her mobilization improves     [place], MPAS, PA-C  12/31/2016, 9:02 AM

## 2017-01-01 DIAGNOSIS — N185 Chronic kidney disease, stage 5: Secondary | ICD-10-CM

## 2017-01-01 LAB — CBC
HCT: 28.4 % — ABNORMAL LOW (ref 36.0–46.0)
HEMOGLOBIN: 9.5 g/dL — AB (ref 12.0–15.0)
MCH: 30.3 pg (ref 26.0–34.0)
MCHC: 33.5 g/dL (ref 30.0–36.0)
MCV: 90.4 fL (ref 78.0–100.0)
PLATELETS: 227 10*3/uL (ref 150–400)
RBC: 3.14 MIL/uL — AB (ref 3.87–5.11)
RDW: 14.2 % (ref 11.5–15.5)
WBC: 9.7 10*3/uL (ref 4.0–10.5)

## 2017-01-01 LAB — BASIC METABOLIC PANEL
ANION GAP: 5 (ref 5–15)
BUN: 41 mg/dL — AB (ref 6–20)
CHLORIDE: 107 mmol/L (ref 101–111)
CO2: 25 mmol/L (ref 22–32)
Calcium: 8.6 mg/dL — ABNORMAL LOW (ref 8.9–10.3)
Creatinine, Ser: 2.14 mg/dL — ABNORMAL HIGH (ref 0.44–1.00)
GFR, EST AFRICAN AMERICAN: 23 mL/min — AB (ref >60)
GFR, EST NON AFRICAN AMERICAN: 20 mL/min — AB (ref >60)
Glucose, Bld: 103 mg/dL — ABNORMAL HIGH (ref 65–99)
POTASSIUM: 3.7 mmol/L (ref 3.5–5.1)
SODIUM: 137 mmol/L (ref 135–145)

## 2017-01-01 MED ORDER — POLYETHYLENE GLYCOL 3350 17 G PO PACK: 17.0000 g | PACK | Freq: Every day | ORAL | 0 refills | Status: DC

## 2017-01-01 MED ORDER — BISACODYL 10 MG RE SUPP: 10.0000 mg | Freq: Every day | RECTAL | 0 refills | Status: DC | PRN

## 2017-01-01 MED ORDER — MAGNESIUM HYDROXIDE 400 MG/5ML PO SUSP
30.0000 mL | Freq: Two times a day (BID) | ORAL | Status: AC
  Administered 2017-01-01: 30 mL via ORAL
  Filled 2017-01-01 (×2): qty 30

## 2017-01-01 MED ORDER — HYDRALAZINE HCL 25 MG PO TABS: 25.0000 mg | ORAL_TABLET | Freq: Three times a day (TID) | ORAL | 0 refills | Status: DC

## 2017-01-01 MED ORDER — MAGNESIUM HYDROXIDE 400 MG/5ML PO SUSP: 30.0000 mL | Freq: Every day | ORAL | 0 refills | Status: DC | PRN

## 2017-01-01 MED ORDER — SORBITOL 70 % SOLN
30.0000 mL | Freq: Once | Status: DC
  Filled 2017-01-01: qty 30

## 2017-01-01 MED ORDER — AMLODIPINE BESYLATE 10 MG PO TABS: 10.0000 mg | ORAL_TABLET | Freq: Every day | ORAL | 0 refills | Status: DC

## 2017-01-01 MED ORDER — ACETAMINOPHEN 500 MG PO TABS: 500.0000 mg | ORAL_TABLET | Freq: Four times a day (QID) | ORAL | 0 refills | Status: DC | PRN

## 2017-01-01 MED ORDER — PANTOPRAZOLE SODIUM 40 MG PO TBEC: 40.0000 mg | DELAYED_RELEASE_TABLET | Freq: Every day | ORAL | 0 refills | Status: DC

## 2017-01-01 MED ORDER — CARVEDILOL 6.25 MG PO TABS: 6.2500 mg | ORAL_TABLET | Freq: Two times a day (BID) | ORAL | 0 refills | Status: DC

## 2017-01-01 MED ORDER — BISACODYL 10 MG RE SUPP
10.0000 mg | Freq: Every day | RECTAL | Status: DC
  Administered 2017-01-01: 11:00:00 10 mg via RECTAL
  Filled 2017-01-01: qty 1

## 2017-01-01 NOTE — Discharge Summary (Signed)
Kathleen Jimenez QZR:[id number] DOB: February 22, 1932 DOA: 12/26/2016  PCP: System, Provider Not In  Admit date: 12/26/2016  Discharge date: 01/02/2017  Admitted From: Home   Disposition:  Home   Recommendations for Outpatient Follow-up:   Follow up with PCP in 1-2 weeks  PCP Please obtain BMP/CBC, 2 view CXR in 1week,  (see Discharge instructions)   PCP Please follow up on the following pending results:     Home Health: PT, RN, aide Equipment/Devices: Gilford Rile, [employer]mode Consultations: Ortho Discharge Condition: Fair   CODE STATUS: Full  Diet Recommendation:  Heart Healthy     No chief complaint on file.    Brief history of present illness from the day of admission and additional interim summary    Patient is a pleasant 83 year old female with history of hypertension who sustained a mechanical fall leading to her left hip fracture. Patient admitted and seen in consultation by orthopedics. Patient for probable ORIF tomorrow 12/28/2016. On admission patient noted to have a elevated renal function concern for acute kidney disease versus chronic kidney disease, anemic with a hemoglobin of 7.7. Patient s/p 2 units packed red blood cells.Patient for ORIF today 12/28/2016.                                                                 Hospital Course    #1 Mechanical Fall with closed intertrochanteric left hip fracture - seen by orthopedics and underwent intramedullary nail placement in the left hip on 12/28/2016 by Dr.Alucio, tolerated the procedure well with minimal perioperative blood loss related anemia requiring 2 units of packed RBC transfusion, she is weightbearing as tolerated, DVT prophylaxis now aspirin daily per orthopedics, Eliquis was stopped due to low GFR. Will be discharged with home PT, aide along with outpatient  follow-up.   #2 acute blood loss anemia  Patient noted to be anemic on admission with a hemoglobin of 7.7. Hemoglobin dropped as low as 6.7. Patient status post 2 units packed red blood cells hemoglobin currently at 9.2 postoperatively. Patient noted with a large ecchymotic area around the left hip around fracture site which is likely etiology of patient's anemia. Post transfusion H&H stable.  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 No blood work by PCP in the last 4-5 years per PCPs fax note, she was also on ACE, ARB, NSAID and HCTZ, all offending medications discontinued, renal ultrasound does not show hydronephrosis or acute changes does have left kidney cyst, UA shows large proteinuria. At this point hold nephrotoxins, renal function is stable and will be monitored, will benefit from outpatient nephrology follow-up and once renal function is stable likely low-dose ACE inhibitor. Her creatinine fluctuated from anywhere between 2.3-1.9 while she was here. Likely this is her baseline.  #4 dehydration  Improved. Patient with good oral intake. Saline lock IV  fluids.   #5 hypertension currently stable on combination of Norvasc, Coreg and hydralazine continue, PCP to monitor and adjust.   #6 Klebsiella pneumonia UTI -  Urinalysis consistent with a UTI. She completed adequate treatment.     Discharge diagnosis     Principal Problem:   Closed intertrochanteric fracture of left hip (HCC) Active Problems:   Hip pain, acute, left   ARF (acute renal failure) (HCC)   HTN (hypertension)   Anemia   Dehydration   Acute blood loss anemia   Chronic kidney disease   UTI due to Klebsiella species    Discharge instructions    Discharge Instructions    Diet - low sodium heart healthy    Complete by:  As directed    Discharge instructions    Complete by:  As directed    Follow with Primary MD  in 7 days   Get CBC, CMP,  checked  by Primary MD  in 5-7 days    Activity: left hip weightbearing as  tolerated with Full fall precautions use walker/cane & assistance as needed  Disposition Home   Diet:    Heart Healthy   On your next visit with your primary care physician please Get Medicines reviewed and adjusted.  Please request your Prim.MD to go over all Hospital Tests and Procedure/Radiological results at the follow up, please get all Hospital records sent to your Prim MD by signing hospital release before you go home.  If you experience worsening of your admission symptoms, develop shortness of breath, life threatening emergency, suicidal or homicidal thoughts you must seek medical attention immediately by calling 911 or calling your MD immediately  if symptoms less severe.   Discharge patient    Complete by:  As directed    Discharge disposition:  01-Home or Self Care   Discharge patient date:  01/02/2017      Discharge Medications   Allergies as of 01/02/2017   No Known Allergies     Medication List    STOP taking these medications   amLODipine-benazepril 5-20 MG capsule Commonly known as:  LOTREL   ibuprofen 200 MG tablet Commonly known as:  ADVIL,MOTRIN   olmesartan-hydrochlorothiazide 40-25 MG tablet Commonly known as:  BENICAR HCT     TAKE these medications   acetaminophen 500 MG tablet Commonly known as:  TYLENOL Take 1 tablet (500 mg total) by mouth every 6 (six) hours as needed for moderate pain, fever or headache.   amLODipine 10 MG tablet Commonly known as:  NORVASC Take 1 tablet (10 mg total) by mouth daily.   aspirin 325 MG EC tablet Take 325 mg by mouth every 6 (six) hours as needed for pain.   bisacodyl 10 MG suppository Commonly known as:  DULCOLAX Place 1 suppository (10 mg total) rectally daily as needed for moderate constipation.   carvedilol 6.25 MG tablet Commonly known as:  COREG Take 1 tablet (6.25 mg total) by mouth 2 (two) times daily with a meal.   hydrALAZINE 25 MG tablet Commonly known as:  APRESOLINE Take 1 tablet (25 mg  total) by mouth every 8 (eight) hours.   magnesium hydroxide 400 MG/5ML suspension Commonly known as:  MILK OF MAGNESIA Take 30 mLs by mouth daily as needed for mild constipation or moderate constipation.   pantoprazole 40 MG tablet Commonly known as:  PROTONIX Take 1 tablet (40 mg total) by mouth daily at 6 (six) AM.   polyethylene glycol packet Commonly known as:  MIRALAX / GLYCOLAX  Take 17 g by mouth daily.            Durable Medical Equipment        Start     Ordered   12/31/16 1241  For home use only DME Walker rolling  Once    Comments:  5 wheel  Question:  Patient needs a walker to treat with the following condition  Answer:  Hip fracture ([place])   12/31/16 1240   12/29/16 1610  For home use only DME Shower stool  Once     12/29/16 1610   12/29/16 1531  For home use only DME 3 n 1  Once     12/29/16 1530   12/29/16 1531  For home use only DME Walker rolling  Once    Question:  Patient needs a walker to treat with the following condition  Answer:  Hip fracture ([place])   12/29/16 1530      Follow-up Information    [facility] Follow [facility].   Why:  Home Health Physical Therapy, Occupational Therapy, RN and aide. agency will call to arrange initial visit Contact information: [address] [place] [address] [phone]        Crist Infante, MD. Schedule an appointment as soon as possible for a visit in 1 week(s).   Specialty:  Internal Medicine Contact information: [place] [address] [phone]        Gaynelle Arabian, MD. Schedule an appointment as soon as possible for a visit in 1 week(s).   Specialty:  Orthopedic Surgery Contact information: [address] [place] [address] [phone]        Elmarie Shiley, MD. Schedule an appointment as soon as possible for a visit in 3 week(s).   Specialty:  Endocrinology Why:  CKD 5          Major procedures and Radiology Reports - PLEASE  review detailed and final reports thoroughly  -         US Renal  Result Date: 12/27/2016 CLINICAL DATA:  83 year old hypertensive female with acute renal failure. Initial encounter. EXAM: RENAL / URINARY TRACT ULTRASOUND COMPLETE COMPARISON:  None. FINDINGS: Right Kidney: Length: 9 cm. Increased echogenicity. No focal mass hydronephrosis. Left Kidney: Length: 8.9 cm. Increased echogenicity. No hydronephrosis. Renal cysts measuring up to 2.3 and 1.6 cm. Bladder: Decompressed by Foley catheter. IMPRESSION: Increased renal echogenicity consistent with changes of medical renal disease. No hydronephrosis. Left renal cysts measuring up to 2.3 cm and 1.6 cm. Bladder decompressed by Foley catheter. Electronically Signed   By: Genia Del M.D.   On: 12/27/2016 11:18   Portable Chest 1 View  Result Date: 12/26/2016 CLINICAL DATA:  Recent falls with weakness. EXAM: PORTABLE CHEST 1 VIEW COMPARISON:  01/24/2006. FINDINGS: 1804 hours. The lungs are clear without focal pneumonia, edema, pneumothorax or pleural effusion. Cardiopericardial silhouette is at upper limits of normal for size. The visualized bony structures of the thorax are intact. IMPRESSION: No active disease. Electronically Signed   By: Misty Stanley M.D.   On: 12/26/2016 20:19   Dg C-arm 1-60 Min-no Report  Result Date: 12/28/2016 CLINICAL DATA:  Internal fixation of left hip fracture. Initial encounter. EXAM: OPERATIVE LEFT HIP (WITH PELVIS IF PERFORMED) 2 VIEWS TECHNIQUE: Fluoroscopic spot image(s) were submitted for interpretation post-operatively. COMPARISON:  Left hip radiographs performed 12/26/2016 FINDINGS: Three fluoroscopic C-arm images are provided from the OR, demonstrating placement of a left hip screw transfixing the intertrochanteric fracture in near  anatomic alignment. No new fracture is seen. The left femoral head remains seated at the acetabulum. IMPRESSION: Status post internal fixation of left femoral intertrochanteric  fracture in near anatomic alignment. No new fracture seen. Electronically Signed   By: Garald Balding M.D.   On: 12/28/2016 22:01   Dg Hip Operative Unilat W Or W/o Pelvis Left  Result Date: 12/28/2016 CLINICAL DATA:  Internal fixation of left hip fracture. Initial encounter. EXAM: OPERATIVE LEFT HIP (WITH PELVIS IF PERFORMED) 2 VIEWS TECHNIQUE: Fluoroscopic spot image(s) were submitted for interpretation post-operatively. COMPARISON:  Left hip radiographs performed 12/26/2016 FINDINGS: Three fluoroscopic C-arm images are provided from the OR, demonstrating placement of a left hip screw transfixing the intertrochanteric fracture in near anatomic alignment. No new fracture is seen. The left femoral head remains seated at the acetabulum. IMPRESSION: Status post internal fixation of left femoral intertrochanteric fracture in near anatomic alignment. No new fracture seen. Electronically Signed   By: Garald Balding M.D.   On: 12/28/2016 22:01   Dg Hip Unilat With Pelvis 2-3 Views Left  Result Date: 12/26/2016 CLINICAL DATA:  Left hip pain, post multiple falls. EXAM: DG HIP (WITH OR WITHOUT PELVIS) 2-3V LEFT COMPARISON:  None. FINDINGS: There is a comminuted impacted intertrochanteric fracture of the left femur with superior migration of the distal fracture fragment and abnormal rotation of the femoral head which is located within the acetabulum. There is an associated soft tissue swelling. Prior intramedullary rod fixation of the right femur. Vascular calcifications noted. IMPRESSION: Comminuted impacted intertrochanteric fracture of the left femur. Electronically Signed   By: Fidela Salisbury M.D.   On: 12/26/2016 20:05    Micro Results     Recent Results (from the past 240 hour(s))  Culture, Urine     Status: Abnormal   Collection Time: 12/26/16  7:00 PM  Result Value Ref Range Status   Specimen Description URINE, RANDOM  Final   Special Requests NONE  Final   Culture (A)  Final    >=100,000  COLONIES/mL KLEBSIELLA PNEUMONIAE >=100,000 COLONIES/mL KLEBSIELLA PNEUMONIAE Susceptibility patterns are not the same. Performed at [facility], [place] [address], [place], [place] [address]    Report Status 12/30/2016 FINAL  Final   Organism ID, Bacteria KLEBSIELLA PNEUMONIAE (A)  Final   Organism ID, Bacteria KLEBSIELLA PNEUMONIAE (A)  Final      Susceptibility   Klebsiella pneumoniae - MIC*    AMPICILLIN >=32 RESISTANT Resistant     CEFAZOLIN <=4 SENSITIVE Sensitive     CEFTRIAXONE <=1 SENSITIVE Sensitive     CIPROFLOXACIN <=0.25 SENSITIVE Sensitive     GENTAMICIN <=1 SENSITIVE Sensitive     IMIPENEM 2 SENSITIVE Sensitive     NITROFURANTOIN 128 RESISTANT Resistant     TRIMETH/SULFA <=20 SENSITIVE Sensitive     AMPICILLIN/SULBACTAM 16 INTERMEDIATE Intermediate     Extended ESBL NEGATIVE Sensitive    Klebsiella pneumoniae - MIC*    AMPICILLIN RESISTANT Resistant     CEFAZOLIN <=4 SENSITIVE Sensitive     CEFTRIAXONE <=1 SENSITIVE Sensitive     CIPROFLOXACIN <=0.25 SENSITIVE Sensitive     GENTAMICIN <=1 SENSITIVE Sensitive     IMIPENEM <=0.25 SENSITIVE Sensitive     NITROFURANTOIN <=16 SENSITIVE Sensitive     TRIMETH/SULFA <=20 SENSITIVE Sensitive     AMPICILLIN/SULBACTAM 4 SENSITIVE Sensitive     PIP/TAZO <=4 SENSITIVE Sensitive     Extended ESBL NEGATIVE Sensitive     * >=100,000 COLONIES/mL KLEBSIELLA PNEUMONIAE    >=100,000 COLONIES/mL KLEBSIELLA  PNEUMONIAE  Surgical PCR screen     Status: None   Collection Time: 12/27/16  6:49 AM  Result Value Ref Range Status   MRSA, PCR NEGATIVE NEGATIVE Final   Staphylococcus aureus NEGATIVE NEGATIVE Final    Comment: (NOTE) The Xpert SA Assay (FDA approved for NASAL specimens in patients 37 years of age and older), is one component of a comprehensive surveillance program. It is not intended to diagnose infection nor to guide or monitor treatment.     Today   Subjective    Kathleen Jimenez today has no headache,no chest  abdominal pain,no new weakness tingling or numbness, feels much better wants to go home today.    Objective   Blood pressure (!) 166/55, pulse 86, temperature 98.2 F (36.8 C), temperature source Oral, resp. rate 18, height 5\' 4"  (1.626 m), weight 62.4 kg (137 lb 9.1 oz), SpO2 96 %.   Intake/Output Summary (Last 24 hours) at 01/02/17 0946 Last data filed at 01/01/17 1140  Gross per 24 hour  Intake                0 ml  Output                1 ml  Net               -1 ml    Exam Awake Alert, Oriented x 3, No new F.N deficits, Normal affect [place].AT,PERRAL Supple Neck,No JVD, No cervical lymphadenopathy appriciated.  Symmetrical Chest wall movement, Good air movement bilaterally, CTAB RRR,No Gallops,Rubs or new Murmurs, No Parasternal Heave +ve B.Sounds, Abd Soft, Non tender, No organomegaly appriciated, No rebound -guarding or rigidity. No Cyanosis, Clubbing or edema, left hip post op site has a moderate size hematoma which is now stable and not extending    Data Review   CBC w Diff:  Lab Results  Component Value Date   WBC 9.7 01/01/2017   HGB 9.5 (L) 01/01/2017   HCT 28.4 (L) 01/01/2017   PLT 227 01/01/2017   LYMPHOPCT 5 12/29/2016   MONOPCT 1 12/29/2016   EOSPCT 0 12/29/2016   BASOPCT 0 12/29/2016    CMP:  Lab Results  Component Value Date   NA 137 01/01/2017   K 3.7 01/01/2017   CL 107 01/01/2017   CO2 25 01/01/2017   BUN 41 (H) 01/01/2017   CREATININE 2.14 (H) 01/01/2017   PROT 6.2 (L) 12/26/2016   ALBUMIN 3.5 12/26/2016   BILITOT 0.9 12/26/2016   ALKPHOS 67 12/26/2016   AST 23 12/26/2016   ALT 19 12/26/2016  .   Total Time in preparing paper work, data evaluation and todays exam - 18 minutes  Lala Lund M.D on 01/02/2017 at 9:46 AM  [facility]   Office  [phone]

## 2017-01-01 NOTE — Progress Notes (Signed)
Subjective: 4 Days Post-Op Procedure(s) (LRB): INTRAMEDULLARY (IM) NAIL INTERTROCHANTERIC LEFT (Left) Patient reports pain as well controlled.  Progress with PT. Tolerating PO's. Denies SOB, CP, or calf pain.  Objective: Vital signs in last 24 hours: Temp:  [97.7 F (36.5 C)-98.5 F (36.9 C)] 98.1 F (36.7 C) (10/14 0625) Pulse Rate:  [73-88] 73 (10/14 0625) Resp:  [16-18] 16 (10/14 0625) BP: (125-154)/(53-64) 154/64 (10/14 0625) SpO2:  [97 %-99 %] 98 % (10/14 0625) Weight:  [57.7 kg (127 lb 2 oz)] 57.7 kg (127 lb 2 oz) (10/14 0500)  Intake/Output from previous day: 10/13 0701 - 10/14 0700 In: 1380 [P.O.:1380] Out: 1100 [Urine:1100] Intake/Output this shift: No intake/output data recorded.   Recent Labs  12/30/16 0522 12/31/16 0507 01/01/17 0543  HGB 9.2* 9.6* 9.5*    Recent Labs  12/31/16 0507 01/01/17 0543  WBC 9.3 9.7  RBC 3.15* 3.14*  HCT 28.6* 28.4*  PLT 233 227    Recent Labs  12/31/16 0507 01/01/17 0543  NA 138 137  K 3.8 3.7  CL 107 107  CO2 23 25  BUN 40* 41*  CREATININE 1.90* 2.14*  GLUCOSE 101* 103*  CALCIUM 8.6* 8.6*   No results for input(s): LABPT, INR in the last 72 hours.  Alert and oriented x3. RRR, Lungs clear, BS x4. Left Calf soft and non tender. L hip dressing C/D/I. No DVT signs. No signs of infection or compartment syndrome. LLE grossly neurovascularly intact.   Assessment/Plan: 4 Days Post-Op Procedure(s) (LRB): INTRAMEDULLARY (IM) NAIL INTERTROCHANTERIC LEFT (Left) Plan D/c home tomorrow Up with PT Pain management Continue other care  Carloyn Lahue L 01/01/2017, 9:04 AM

## 2017-01-01 NOTE — Progress Notes (Signed)
Occupational Therapy Treatment Patient Details Name: Kathleen Jimenez MRN: [id number] DOB: 1931/04/21 Today's Date: 01/01/2017    History of present illness Pt had IM nail of left hip on 12/28/2016.  She is progressing well post op.    OT comments  Pt is making excellent progress.  She requires min guard assist for functional transfers and grooming at sink.  She is able to return demonstration of safe shower transfer.  Will continue to follow.   Follow Up Recommendations  Supervision/Assistance - 24 hour    Equipment Recommendations  3 in 1 bedside commode    Recommendations for Other Services      Precautions / Restrictions Precautions Precautions: Fall Restrictions LLE Weight Bearing: Weight bearing as tolerated       Mobility Bed Mobility                  Transfers Overall transfer level: Needs assistance Equipment used: Rolling walker (2 wheeled) Transfers: Sit to/from [facility] Sit to Stand: Supervision Stand pivot transfers: Min guard            Balance                                           ADL either performed or assessed with clinical judgement   ADL Overall ADL's : Needs assistance/impaired     Grooming: Wash/dry hands;Min Dispensing optician: Min guard;Ambulation;Comfort height toilet;BSC;RW;Grab bars   Toileting- Clothing Manipulation and Hygiene: Min guard;Sit to/from stand   Tub/ Shower Transfer: Min guard;Walk-in shower;Ambulation;Shower Technical sales engineer Details (indicate cue type and reason): Pt was able to return demonstration of safe technique  Functional mobility during ADLs: Therapist, art      Cognition Arousal/Alertness: Awake/alert Behavior During Therapy: WFL for tasks assessed/performed Overall Cognitive Status: Within Functional Limits for tasks assessed                                           Exercises     Shoulder Instructions       General Comments      Pertinent Vitals/ Pain       Pain Assessment: No/denies pain  Home Living                                          Prior Functioning/Environment              Frequency           Progress Toward Goals  OT Goals(current goals can now be found in the care plan section)  Progress towards OT goals: Progressing toward goals     Plan Discharge plan remains appropriate    Co-evaluation                 AM-PAC PT "6 Clicks" Daily Activity     Outcome Measure   Help from another person eating meals?: None Help from another person taking care of personal grooming?: A  Little Help from another person toileting, which includes using toliet, bedpan, or urinal?: A Little Help from another person bathing (including washing, rinsing, drying)?: A Little Help from another person to put on and taking off regular upper body clothing?: A Little Help from another person to put on and taking off regular lower body clothing?: A Lot 6 Click Score: 18    End of Session Equipment Utilized During Treatment: Rolling walker  OT Visit Diagnosis: Unsteadiness on feet (R26.81)   Activity Tolerance Patient tolerated treatment well   Patient Left in chair;with call bell/phone within reach;with chair alarm set   Nurse Communication Mobility status        Time: 9480-1655 OT Time Calculation (min): 32 min  Charges: OT General Charges $OT Visit: 1 Visit OT Treatments $Self Care/Home Management : 23-37 mins  [facility], OTR/L [phone]    Lucille Passy M 01/01/2017, 12:51 PM

## 2017-01-01 NOTE — Discharge Instructions (Signed)
Follow with Primary MD  in 7 days   Get CBC, CMP,  checked  by Primary MD  in 5-7 days    Activity: left hip weightbearing as tolerated with Full fall precautions use walker/cane & assistance as needed  Disposition Home   Diet:    Heart Healthy   On your next visit with your primary care physician please Get Medicines reviewed and adjusted.  Please request your Prim.MD to go over all Hospital Tests and Procedure/Radiological results at the follow up, please get all Hospital records sent to your Prim MD by signing hospital release before you go home.  If you experience worsening of your admission symptoms, develop shortness of breath, life threatening emergency, suicidal or homicidal thoughts you must seek medical attention immediately by calling 911 or calling your MD immediately  if symptoms less severe.

## 2017-01-01 NOTE — Progress Notes (Signed)
Physical Therapy Treatment Patient Details Name: Kathleen Jimenez MRN: [id number] DOB: Feb 27, 1932 Today's Date: 01/01/2017    History of Present Illness Pt had IM nail of left hip on 12/28/2016.  She is progressing well post op.     PT Comments    Pt progressing well with mobility including increased ambulation distance and decreased assist for all mobility tasks.  Pt hopeful for dc home tomorrow.   Follow Up Recommendations  Home health PT     Equipment Recommendations  Rolling walker with 5" wheels    Recommendations for Other Services       Precautions / Restrictions Precautions Precautions: Fall Restrictions Weight Bearing Restrictions: No LLE Weight Bearing: Weight bearing as tolerated    Mobility  Bed Mobility Overal bed mobility: Needs Assistance Bed Mobility: Sit to Supine       Sit to supine: Min assist   General bed mobility comments: cues for sequence with min assist to manage L LE  Transfers Overall transfer level: Needs assistance Equipment used: Rolling walker (2 wheeled) Transfers: Sit to/from Stand Sit to Stand: Supervision         General transfer comment: cues for safe transition position and use of UEs to self assist  Ambulation/Gait Ambulation/Gait assistance: Min guard;Supervision Ambulation Distance (Feet): 160 Feet Assistive device: Rolling walker (2 wheeled) Gait Pattern/deviations: Step-to pattern;Step-through pattern;Decreased step length - right;Decreased step length - left;Shuffle;Trunk flexed Gait velocity: decr Gait velocity interpretation: Below normal speed for age/gender General Gait Details: cues for posture and position from RW   Stairs            Wheelchair Mobility    Modified Rankin (Stroke Patients Only)       Balance Overall balance assessment: History of Falls                                          Cognition Arousal/Alertness: Awake/alert Behavior During Therapy: WFL for tasks  assessed/performed Overall Cognitive Status: Within Functional Limits for tasks assessed                                        Exercises General Exercises - Lower Extremity Ankle Circles/Pumps: AROM;Both;20 reps;Supine Quad Sets: AROM;Both;10 reps;Supine Gluteal Sets: AROM;Both;10 reps;Supine Heel Slides: AAROM;Left;20 reps;Supine Hip ABduction/ADduction: AAROM;Left;15 reps;Supine    General Comments        Pertinent Vitals/Pain Pain Assessment: 0-10 Pain Score: 2  Pain Location: L hip Pain Descriptors / Indicators: Discomfort;Sore Pain Intervention(s): Limited activity within patient's tolerance;Monitored during session;Ice applied    Home Living                      Prior Function            PT Goals (current goals can now be found in the care plan section) Acute Rehab PT Goals Patient Stated Goal: be as independent as possible  PT Goal Formulation: With patient Time For Goal Achievement: 01/05/17 Potential to Achieve Goals: Good Progress towards PT goals: Progressing toward goals    Frequency    Min 3X/week      PT Plan Current plan remains appropriate    Co-evaluation              AM-PAC PT "6 Clicks" Daily Activity  Outcome Measure  Difficulty turning over in bed (including adjusting bedclothes, sheets and blankets)?: A Little Difficulty moving from lying on back to sitting on the side of the bed? : A Lot Difficulty sitting down on and standing up from a chair with arms (e.g., wheelchair, bedside commode, etc,.)?: A Little Help needed moving to and from a bed to chair (including a wheelchair)?: A Little Help needed walking in hospital room?: A Little Help needed climbing 3-5 steps with a railing? : A Little 6 Click Score: 17    End of Session Equipment Utilized During Treatment: Gait belt Activity Tolerance: Patient tolerated treatment well Patient left: in bed;with call bell/phone within reach;with family/visitor  present;with bed alarm set Nurse Communication: Mobility status PT Visit Diagnosis: Difficulty in walking, not elsewhere classified (R26.2)     Time: 9798-9211 PT Time Calculation (min) (ACUTE ONLY): 26 min  Charges:  $Gait Training: 8-22 mins $Therapeutic Exercise: 8-22 mins                    G Codes:       Pg [phone]    Avenell Sellers 01/01/2017, 4:07 PM

## 2017-01-01 NOTE — Care Management Note (Signed)
Case Management Note  Patient Details  Name: Kathleen Jimenez MRN: [id number] Date of Birth: Dec 31, 1931  Subjective/Objective:    Closed intertrochanteric fracture, Left IM Nail              Action/Plan: Discharge Planning: please see previous NCM notes  NCM spoke to pt and states she has DME. Her son arranged [place] with [facility]. [facility] has pt on list.   PCP Elie Goody MD (in [place])  Expected Discharge Date:                Expected Discharge Plan:  [place]  In-House Referral:  Clinical Social Work  Discharge planning Services  CM Consult  Post Acute Care Choice:  Home Health Choice offered to:  Adult Children  DME Arranged:  N/A DME Agency:  NA  HH Arranged:  RN, PT, Nurse's Aide, OT, Social Work [employer] Agency:  [place]  Status of Service:  Completed, signed off  If discussed at [employer], dates discussed:    Additional Comments:  Erenest Rasher, RN 01/01/2017, 9:52 AM

## 2017-01-02 NOTE — Progress Notes (Signed)
Physical Therapy Treatment Patient Details Name: Kathleen Jimenez MRN: [id number] DOB: 08-04-1931 Today's Date: 01/02/2017    History of Present Illness Pt had IM nail of left hip on 12/28/2016.  She is progressing well post op.     PT Comments    POD # 5 Assisted with amb in hallway and performed all supine hip TE's following HEP handout.  Instructed on proper tech, freq as well as use of ICE.    Follow Up Recommendations  Home health PT     Equipment Recommendations  Rolling walker with 5" wheels    Recommendations for Other Services       Precautions / Restrictions Precautions Precautions: Fall Restrictions Weight Bearing Restrictions: No LLE Weight Bearing: Weight bearing as tolerated    Mobility  Bed Mobility               General bed mobility comments: OOB in recliner  Transfers Overall transfer level: Needs assistance Equipment used: Rolling walker (2 wheeled) Transfers: Sit to/from Stand Sit to Stand: Supervision         General transfer comment: < 25% cues for safe transition position and use of UEs to self assist  Ambulation/Gait Ambulation/Gait assistance: Supervision Ambulation Distance (Feet): 85 Feet Assistive device: Rolling walker (2 wheeled) Gait Pattern/deviations: Step-to pattern;Step-through pattern;Decreased step length - right;Decreased step length - left;Shuffle;Trunk flexed Gait velocity: decr   General Gait Details: <25% cues for posture and position from [employer]:  (no stairs at son's home)          Wheelchair Mobility    Modified Rankin (Stroke Patients Only)       Balance                                            Cognition Arousal/Alertness: Awake/alert Behavior During Therapy: WFL for tasks assessed/performed Overall Cognitive Status: Within Functional Limits for tasks assessed                                        Exercises      General Comments         Pertinent Vitals/Pain Pain Assessment: 0-10 Pain Score: 4  Pain Location: L hip Pain Descriptors / Indicators: Discomfort;Sore;Operative site guarding Pain Intervention(s): Monitored during session;Repositioned;Ice applied    Home Living                      Prior Function            PT Goals (current goals can now be found in the care plan section) Progress towards PT goals: Progressing toward goals    Frequency    Min 3X/week      PT Plan Current plan remains appropriate    Co-evaluation              AM-PAC PT "6 Clicks" Daily Activity  Outcome Measure  Difficulty turning over in bed (including adjusting bedclothes, sheets and blankets)?: A Little Difficulty moving from lying on back to sitting on the side of the bed? : A Lot   Help needed moving to and from a bed to chair (including a wheelchair)?: A Little Help needed walking in hospital room?: A Little Help needed climbing 3-5 steps with a railing? : A  Little 6 Click Score: 14    End of Session Equipment Utilized During Treatment: Gait belt Activity Tolerance: Patient tolerated treatment well Patient left: in chair;with call bell/phone within reach Nurse Communication: Mobility status (pt ready for D/C to home) PT Visit Diagnosis: Difficulty in walking, not elsewhere classified (R26.2)     Time: 1792-1783 PT Time Calculation (min) (ACUTE ONLY): 27 min  Charges:  $Gait Training: 8-22 mins $Therapeutic Exercise: 8-22 mins                    G Codes:       Rica Koyanagi  PTA [facility]  Acute  Rehab Pager      [phone]

## 2017-03-06 ENCOUNTER — Other Ambulatory Visit (HOSPITAL_COMMUNITY): Payer: Self-pay | Admitting: Internal Medicine

## 2017-03-06 ENCOUNTER — Ambulatory Visit (HOSPITAL_COMMUNITY)
Admission: RE | Admit: 2017-03-06 | Discharge: 2017-03-06 | Disposition: A | Discharge status: Home / Self Care | Payer: Medicare PPO | Source: Ambulatory Visit | Attending: Surgery | Admitting: Surgery

## 2017-03-06 DIAGNOSIS — R0989 Other specified symptoms and signs involving the circulatory and respiratory systems: Secondary | ICD-10-CM | POA: Diagnosis not present

## 2017-03-06 LAB — VAS US CAROTID
LCCAPDIAS: 13 cm/s
LEFT ECA DIAS: 18 cm/s
LICADDIAS: -19 cm/s
LICAPDIAS: 123 cm/s
LICAPSYS: 479 cm/s
Left CCA dist dias: 14 cm/s
Left CCA dist sys: 80 cm/s
Left CCA prox sys: 77 cm/s
Left ICA dist sys: -58 cm/s
RIGHT CCA MID DIAS: 15 cm/s
RIGHT ECA DIAS: 22 cm/s
RIGHT VERTEBRAL DIAS: 21 cm/s
Right CCA prox dias: 15 cm/s
Right CCA prox sys: 103 cm/s
Right cca dist sys: -86 cm/s

## 2017-04-07 DIAGNOSIS — Z4789 Encounter for other orthopedic aftercare: Secondary | ICD-10-CM | POA: Diagnosis not present

## 2017-04-07 DIAGNOSIS — S72142D Displaced intertrochanteric fracture of left femur, subsequent encounter for closed fracture with routine healing: Secondary | ICD-10-CM | POA: Diagnosis not present

## 2017-04-25 ENCOUNTER — Encounter: Payer: Self-pay | Admitting: Vascular Surgery

## 2017-04-25 ENCOUNTER — Ambulatory Visit: Payer: PPO | Admitting: Vascular Surgery

## 2017-04-25 VITALS — BP 133/74 | HR 69 | Temp 97.7°F | Resp 18 | Ht 64.0 in | Wt 117.6 lb

## 2017-04-25 DIAGNOSIS — M810 Age-related osteoporosis without current pathological fracture: Secondary | ICD-10-CM | POA: Diagnosis not present

## 2017-04-25 DIAGNOSIS — I6522 Occlusion and stenosis of left carotid artery: Secondary | ICD-10-CM

## 2017-04-25 DIAGNOSIS — N183 Chronic kidney disease, stage 3 (moderate): Secondary | ICD-10-CM | POA: Diagnosis not present

## 2017-04-25 DIAGNOSIS — Z6822 Body mass index (BMI) 22.0-22.9, adult: Secondary | ICD-10-CM | POA: Diagnosis not present

## 2017-04-25 NOTE — H&P (View-Only) (Signed)
Vascular and Vein Specialist of [facility]  Patient name: Kathleen Jimenez MRN: [id number] DOB: 03/28/1931 Sex: female  REASON FOR CONSULT: Evaluation of asymptomatic left internal carotid artery stenosis  HPI: Kathleen Jimenez is a 84 y.o. female, who is here today for evaluation of asymptomatic left internal carotid artery stenosis.  She is here today with her son, Dr. Armandina Gemma.  She was seeing her primary care physician, Dr. Rica Koyanagi who noted a harsh left carotid bruit.  She subsequently underwent carotid duplex in our office on 03/06/2017.  This showed a high-grade greater than 80% left internal carotid artery stenosis and right 40-59% carotid stenosis.  She has had no prior history of amaurosis fugax, transient ischemic attack or stroke.  She has no history of cardiac disease.  She has been extremely active her entire life.  She did recently suffer a hip fracture and underwent hip pinning with this and is recovering from this.  She still walks with a walker related to her recent hip surgery.  She did have a significant postop anemia down to a hemoglobin of 6 around the time of her surgery.  Past Medical History:  Diagnosis Date  . Anemia   . Chronic kidney disease   . Hypertension     Family History  Problem Relation Age of Onset  . Congestive Heart Failure Mother   . Heart disease Mother   . Stroke Father     SOCIAL HISTORY: Social History   Socioeconomic History  . Marital status: Married    Spouse name: Not on file  . Number of children: Not on file  . Years of education: Not on file  . Highest education level: Not on file  Social Needs  . Financial resource strain: Not on file  . Food insecurity - worry: Not on file  . Food insecurity - inability: Not on file  . Transportation needs - medical: Not on file  . Transportation needs - non-medical: Not on file  Occupational History  . Not on file  Tobacco Use  . Smoking status: Never  Smoker  . Smokeless tobacco: Never Used  Substance and Sexual Activity  . Alcohol use: No  . Drug use: No  . Sexual activity: Not on file  Other Topics Concern  . Not on file  Social History Narrative  . Not on file    No Known Allergies  Current Outpatient Medications  Medication Sig Dispense Refill  . acetaminophen (TYLENOL) 500 MG tablet Take 1 tablet (500 mg total) by mouth every 6 (six) hours as needed for moderate pain, fever or headache. 30 tablet 0  . amLODipine (NORVASC) 10 MG tablet Take 1 tablet (10 mg total) by mouth daily. 30 tablet 0  . aspirin 325 MG EC tablet Take 325 mg by mouth every 6 (six) hours as needed for pain.    . carvedilol (COREG) 6.25 MG tablet Take 1 tablet (6.25 mg total) by mouth 2 (two) times daily with a meal. 60 tablet 0  . hydrALAZINE (APRESOLINE) 25 MG tablet Take 1 tablet (25 mg total) by mouth every 8 (eight) hours. 90 tablet 0  . bisacodyl (DULCOLAX) 10 MG suppository Place 1 suppository (10 mg total) rectally daily as needed for moderate constipation. (Patient not taking: Reported on 04/25/2017) 30 suppository 0  . magnesium hydroxide (MILK OF MAGNESIA) 400 MG/5ML suspension Take 30 mLs by mouth daily as needed for mild constipation or moderate constipation. (Patient not taking: Reported on 04/25/2017) 360 mL 0  .  pantoprazole (PROTONIX) 40 MG tablet Take 1 tablet (40 mg total) by mouth daily at 6 (six) AM. (Patient not taking: Reported on 04/25/2017) 30 tablet 0  . polyethylene glycol (MIRALAX / GLYCOLAX) packet Take 17 g by mouth daily. (Patient not taking: Reported on 04/25/2017) 30 each 0   No current facility-administered medications for this visit.     REVIEW OF SYSTEMS:  [X]  denotes positive finding, [ ]  denotes negative finding Cardiac  Comments:  Chest pain or chest pressure:    Shortness of breath upon exertion:    Short of breath when lying flat:    Irregular heart rhythm:        Vascular    Pain in calf, thigh, or hip brought on by  ambulation:    Pain in feet at night that wakes you up from your sleep:     Blood clot in your veins:    Leg swelling:         Pulmonary    Oxygen at home:    Productive cough:     Wheezing:         Neurologic    Sudden weakness in arms or legs:     Sudden numbness in arms or legs:     Sudden onset of difficulty speaking or slurred speech:    Temporary loss of vision in one eye:     Problems with dizziness:         Gastrointestinal    Blood in stool:     Vomited blood:         Genitourinary    Burning when urinating:     Blood in urine:        Psychiatric    Major depression:         Hematologic    Bleeding problems:    Problems with blood clotting too easily:        Skin    Rashes or ulcers:        Constitutional    Fever or chills:      PHYSICAL EXAM: Vitals:   04/25/17 1334 04/25/17 1335  BP: (!) 141/76 133/74  Pulse: 69   Resp: 18   Temp: 97.7 F (36.5 C)   TempSrc: Oral   SpO2: 98%   Weight: 117 lb 9.6 oz (53.3 kg)   Height: 5\' 4"  (1.626 m)     GENERAL: The patient is a well-nourished female, in no acute distress. The vital signs are documented above. CARDIOVASCULAR: 2+ radial and 2+ dorsalis pedis pulses bilaterally.  She does have a harsh left carotid bruit and no bruit on the right.   PULMONARY: There is good air exchange  ABDOMEN: Soft and non-tender  MUSCULOSKELETAL: There are no major deformities or cyanosis. NEUROLOGIC: No focal weakness or paresthesias are detected. SKIN: There are no ulcers or rashes noted. PSYCHIATRIC: The patient has a normal affect.  DATA:  Carotid duplex reviewed from 03/06/2017 revealing high-grade stenosis of her left internal carotid artery.  Duplex imaging revealed normal internal carotid artery distal to the stenosis.  MEDICAL ISSUES: I had a long discussion with the patient and her son present.  I explained the increased risk of stroke related to her high-grade asymptomatic carotid disease.  Explained that from  a statistical standpoint her risk of stroke or transient event would be approximately 5 %/year if left untreated.  She is on 325 mg of aspirin daily and I have encouraged her to continue this.  I did explain the indication  for left carotid endarterectomy for reduction of stroke risk.  Explained the 1-2% risk of stroke with surgery and also the very slight risk of cranial nerve injury and explained the typical course of carotid surgery which would be admission the day of surgery overnight stay in the stepdown unit and typically discharged home on postoperative quick recovery.  He wishes to consider this option for surgery and discuss it further with her family.  We are available for left carotid endarterectomy should she wish to proceed   [name], MD [facility] Vascular and Vein Specialists of [facility] Tel [phone] Pager [phone]

## 2017-04-25 NOTE — Progress Notes (Signed)
Vascular and Vein Specialist of [facility]  Patient name: Kathleen Jimenez MRN: [id number] DOB: 04-18-1931 Sex: female  REASON FOR CONSULT: Evaluation of asymptomatic left internal carotid artery stenosis  HPI: Kathleen Jimenez is a 84 y.o. female, who is here today for evaluation of asymptomatic left internal carotid artery stenosis.  She is here today with her son, Dr. Armandina Gemma.  She was seeing her primary care physician, Dr. Rica Koyanagi who noted a harsh left carotid bruit.  She subsequently underwent carotid duplex in our office on 03/06/2017.  This showed a high-grade greater than 80% left internal carotid artery stenosis and right 40-59% carotid stenosis.  She has had no prior history of amaurosis fugax, transient ischemic attack or stroke.  She has no history of cardiac disease.  She has been extremely active her entire life.  She did recently suffer a hip fracture and underwent hip pinning with this and is recovering from this.  She still walks with a walker related to her recent hip surgery.  She did have a significant postop anemia down to a hemoglobin of 6 around the time of her surgery.  Past Medical History:  Diagnosis Date  . Anemia   . Chronic kidney disease   . Hypertension     Family History  Problem Relation Age of Onset  . Congestive Heart Failure Mother   . Heart disease Mother   . Stroke Father     SOCIAL HISTORY: Social History   Socioeconomic History  . Marital status: Married    Spouse name: Not on file  . Number of children: Not on file  . Years of education: Not on file  . Highest education level: Not on file  Social Needs  . Financial resource strain: Not on file  . Food insecurity - worry: Not on file  . Food insecurity - inability: Not on file  . Transportation needs - medical: Not on file  . Transportation needs - non-medical: Not on file  Occupational History  . Not on file  Tobacco Use  . Smoking status: Never  Smoker  . Smokeless tobacco: Never Used  Substance and Sexual Activity  . Alcohol use: No  . Drug use: No  . Sexual activity: Not on file  Other Topics Concern  . Not on file  Social History Narrative  . Not on file    No Known Allergies  Current Outpatient Medications  Medication Sig Dispense Refill  . acetaminophen (TYLENOL) 500 MG tablet Take 1 tablet (500 mg total) by mouth every 6 (six) hours as needed for moderate pain, fever or headache. 30 tablet 0  . amLODipine (NORVASC) 10 MG tablet Take 1 tablet (10 mg total) by mouth daily. 30 tablet 0  . aspirin 325 MG EC tablet Take 325 mg by mouth every 6 (six) hours as needed for pain.    . carvedilol (COREG) 6.25 MG tablet Take 1 tablet (6.25 mg total) by mouth 2 (two) times daily with a meal. 60 tablet 0  . hydrALAZINE (APRESOLINE) 25 MG tablet Take 1 tablet (25 mg total) by mouth every 8 (eight) hours. 90 tablet 0  . bisacodyl (DULCOLAX) 10 MG suppository Place 1 suppository (10 mg total) rectally daily as needed for moderate constipation. (Patient not taking: Reported on 04/25/2017) 30 suppository 0  . magnesium hydroxide (MILK OF MAGNESIA) 400 MG/5ML suspension Take 30 mLs by mouth daily as needed for mild constipation or moderate constipation. (Patient not taking: Reported on 04/25/2017) 360 mL 0  .  pantoprazole (PROTONIX) 40 MG tablet Take 1 tablet (40 mg total) by mouth daily at 6 (six) AM. (Patient not taking: Reported on 04/25/2017) 30 tablet 0  . polyethylene glycol (MIRALAX / GLYCOLAX) packet Take 17 g by mouth daily. (Patient not taking: Reported on 04/25/2017) 30 each 0   No current facility-administered medications for this visit.     REVIEW OF SYSTEMS:  [X]  denotes positive finding, [ ]  denotes negative finding Cardiac  Comments:  Chest pain or chest pressure:    Shortness of breath upon exertion:    Short of breath when lying flat:    Irregular heart rhythm:        Vascular    Pain in calf, thigh, or hip brought on by  ambulation:    Pain in feet at night that wakes you up from your sleep:     Blood clot in your veins:    Leg swelling:         Pulmonary    Oxygen at home:    Productive cough:     Wheezing:         Neurologic    Sudden weakness in arms or legs:     Sudden numbness in arms or legs:     Sudden onset of difficulty speaking or slurred speech:    Temporary loss of vision in one eye:     Problems with dizziness:         Gastrointestinal    Blood in stool:     Vomited blood:         Genitourinary    Burning when urinating:     Blood in urine:        Psychiatric    Major depression:         Hematologic    Bleeding problems:    Problems with blood clotting too easily:        Skin    Rashes or ulcers:        Constitutional    Fever or chills:      PHYSICAL EXAM: Vitals:   04/25/17 1334 04/25/17 1335  BP: (!) 141/76 133/74  Pulse: 69   Resp: 18   Temp: 97.7 F (36.5 C)   TempSrc: Oral   SpO2: 98%   Weight: 117 lb 9.6 oz (53.3 kg)   Height: 5\' 4"  (1.626 m)     GENERAL: The patient is a well-nourished female, in no acute distress. The vital signs are documented above. CARDIOVASCULAR: 2+ radial and 2+ dorsalis pedis pulses bilaterally.  She does have a harsh left carotid bruit and no bruit on the right.   PULMONARY: There is good air exchange  ABDOMEN: Soft and non-tender  MUSCULOSKELETAL: There are no major deformities or cyanosis. NEUROLOGIC: No focal weakness or paresthesias are detected. SKIN: There are no ulcers or rashes noted. PSYCHIATRIC: The patient has a normal affect.  DATA:  Carotid duplex reviewed from 03/06/2017 revealing high-grade stenosis of her left internal carotid artery.  Duplex imaging revealed normal internal carotid artery distal to the stenosis.  MEDICAL ISSUES: I had a long discussion with the patient and her son present.  I explained the increased risk of stroke related to her high-grade asymptomatic carotid disease.  Explained that from  a statistical standpoint her risk of stroke or transient event would be approximately 5 %/year if left untreated.  She is on 325 mg of aspirin daily and I have encouraged her to continue this.  I did explain the indication  for left carotid endarterectomy for reduction of stroke risk.  Explained the 1-2% risk of stroke with surgery and also the very slight risk of cranial nerve injury and explained the typical course of carotid surgery which would be admission the day of surgery overnight stay in the stepdown unit and typically discharged home on postoperative quick recovery.  He wishes to consider this option for surgery and discuss it further with her family.  We are available for left carotid endarterectomy should she wish to proceed   [name], MD [facility] Vascular and Vein Specialists of [facility] Tel [phone] Pager [phone]

## 2017-04-27 ENCOUNTER — Other Ambulatory Visit: Payer: Self-pay | Admitting: *Deleted

## 2017-04-27 ENCOUNTER — Telehealth: Payer: Self-pay | Admitting: *Deleted

## 2017-04-27 NOTE — Telephone Encounter (Signed)
Called and left a msg. For patient to call this office back re: her desire to proceed with scheduling surgery with Dr. Donnetta Hutching.

## 2017-04-27 NOTE — Telephone Encounter (Signed)
Phone call to patient's son. Arrive at [facility] hospital admitting department at 7:30 am on 05/19/17 for surgery. NPO past MN night prior, continue ASA, and expect call to sched. Pre-op appointment and follow the instructions from the [facility] pre-admission testing department about this surgery. Call back to [facility] if any questions.

## 2017-05-15 ENCOUNTER — Encounter (HOSPITAL_COMMUNITY): Payer: Self-pay

## 2017-05-15 ENCOUNTER — Encounter (HOSPITAL_COMMUNITY)
Admission: RE | Admit: 2017-05-15 | Discharge: 2017-05-15 | Disposition: A | Discharge status: Home / Self Care | Payer: PPO | Source: Ambulatory Visit | Attending: Vascular Surgery | Admitting: Vascular Surgery

## 2017-05-15 ENCOUNTER — Telehealth: Payer: Self-pay | Admitting: *Deleted

## 2017-05-15 ENCOUNTER — Other Ambulatory Visit: Payer: Self-pay

## 2017-05-15 DIAGNOSIS — Z7982 Long term (current) use of aspirin: Secondary | ICD-10-CM | POA: Insufficient documentation

## 2017-05-15 DIAGNOSIS — Z01818 Encounter for other preprocedural examination: Secondary | ICD-10-CM | POA: Insufficient documentation

## 2017-05-15 DIAGNOSIS — Z79899 Other long term (current) drug therapy: Secondary | ICD-10-CM | POA: Insufficient documentation

## 2017-05-15 DIAGNOSIS — N189 Chronic kidney disease, unspecified: Secondary | ICD-10-CM | POA: Diagnosis not present

## 2017-05-15 DIAGNOSIS — I129 Hypertensive chronic kidney disease with stage 1 through stage 4 chronic kidney disease, or unspecified chronic kidney disease: Secondary | ICD-10-CM | POA: Insufficient documentation

## 2017-05-15 DIAGNOSIS — I6522 Occlusion and stenosis of left carotid artery: Secondary | ICD-10-CM | POA: Insufficient documentation

## 2017-05-15 DIAGNOSIS — N390 Urinary tract infection, site not specified: Secondary | ICD-10-CM

## 2017-05-15 DIAGNOSIS — R319 Hematuria, unspecified: Principal | ICD-10-CM

## 2017-05-15 HISTORY — DX: Occlusion and stenosis of left carotid artery: I65.22

## 2017-05-15 LAB — URINALYSIS, MICROSCOPIC (REFLEX)

## 2017-05-15 LAB — PROTIME-INR
INR: 1.07
PROTHROMBIN TIME: 13.8 s (ref 11.4–15.2)

## 2017-05-15 LAB — COMPREHENSIVE METABOLIC PANEL
ALT: 21 U/L (ref 14–54)
AST: 31 U/L (ref 15–41)
Albumin: 3.9 g/dL (ref 3.5–5.0)
Alkaline Phosphatase: 122 U/L (ref 38–126)
Anion gap: 11 (ref 5–15)
BUN: 30 mg/dL — AB (ref 6–20)
CO2: 22 mmol/L (ref 22–32)
Calcium: 9.3 mg/dL (ref 8.9–10.3)
Chloride: 107 mmol/L (ref 101–111)
Creatinine, Ser: 1.7 mg/dL — ABNORMAL HIGH (ref 0.44–1.00)
GFR calc Af Amer: 31 mL/min — ABNORMAL LOW (ref >60)
GFR, EST NON AFRICAN AMERICAN: 27 mL/min — AB (ref >60)
Glucose, Bld: 103 mg/dL — ABNORMAL HIGH (ref 65–99)
POTASSIUM: 3.9 mmol/L (ref 3.5–5.1)
Sodium: 140 mmol/L (ref 135–145)
Total Bilirubin: 0.6 mg/dL (ref 0.3–1.2)
Total Protein: 7 g/dL (ref 6.5–8.1)

## 2017-05-15 LAB — CBC
HCT: 35.6 % — ABNORMAL LOW (ref 36.0–46.0)
Hemoglobin: 11.4 g/dL — ABNORMAL LOW (ref 12.0–15.0)
MCH: 30.4 pg (ref 26.0–34.0)
MCHC: 32 g/dL (ref 30.0–36.0)
MCV: 94.9 fL (ref 78.0–100.0)
PLATELETS: 242 10*3/uL (ref 150–400)
RBC: 3.75 MIL/uL — AB (ref 3.87–5.11)
RDW: 13.1 % (ref 11.5–15.5)
WBC: 9.1 10*3/uL (ref 4.0–10.5)

## 2017-05-15 LAB — URINALYSIS, ROUTINE W REFLEX MICROSCOPIC
Bilirubin Urine: NEGATIVE
Glucose, UA: NEGATIVE mg/dL
KETONES UR: NEGATIVE mg/dL
NITRITE: POSITIVE — AB
PH: 5.5 (ref 5.0–8.0)
PROTEIN: 100 mg/dL — AB
Specific Gravity, Urine: 1.025 (ref 1.005–1.030)

## 2017-05-15 LAB — SURGICAL PCR SCREEN
MRSA, PCR: NEGATIVE
STAPHYLOCOCCUS AUREUS: NEGATIVE

## 2017-05-15 LAB — APTT: APTT: 26 s (ref 24–36)

## 2017-05-15 MED ORDER — CIPROFLOXACIN HCL 250 MG PO TABS: 500.0000 mg | ORAL_TABLET | Freq: Two times a day (BID) | ORAL | 1 refills | Status: DC

## 2017-05-15 NOTE — Telephone Encounter (Signed)
Per Minna Merritts, RN at preop testing, Kathleen Jimenez has an abnormal UA today that includes, WBCs, nitrites, bacteria and protein. I will contact patient and start her on Cipro as per our standing orders.

## 2017-05-15 NOTE — Progress Notes (Signed)
Zigmund Daniel, RN from Dr. Luther Parody office made aware of abnormal urine results.

## 2017-05-15 NOTE — Pre-Procedure Instructions (Signed)
Kathleen Jimenez  05/15/2017      [place], [place] [address] Phone: [phone] Fax: [phone]    Your procedure is scheduled on Friday, May 19, 2017  Report to [facility] Admitting Entrance "A" at 7:15AM   Call this number if you have problems the morning of surgery:  [phone]   Remember:  Do not eat food or drink liquids after midnight.  Take these medicines the morning of surgery with A SIP OF WATER: AmLODipine (NORVASC), Carvedilol (COREG), and HydrALAZINE (APRESOLINE).  Follow your doctor's instruction regarding Aspirin.  As of today, stop taking all Aspirins, Vitamins, Fish oils, and Herbal medications. Also stop all NSAIDS i.e. Advil, Ibuprofen, Motrin, Aleve, Anaprox, Naproxen, BC and Goody Powders.   Do not wear jewelry, make-up or nail polish.  Do not wear lotions, powders, perfumes, or deodorant.  Do not shave 48 hours prior to surgery.   Do not bring valuables to the hospital.  [facility] is not responsible for any belongings or valuables.  Contacts, dentures or bridgework may not be worn into surgery.  Leave your suitcase in the car.  After surgery it may be brought to your room.  For patients admitted to the hospital, discharge time will be determined by your treatment team.  Patients discharged the day of surgery will not be allowed to drive home.   Special instructions:   Kathleen Jimenez- Preparing For Surgery  Before surgery, you can play an important role. Because skin is not sterile, your skin needs to be as free of germs as possible. You can reduce the number of germs on your skin by washing with CHG (chlorahexidine gluconate) Soap before surgery.  CHG is an antiseptic cleaner which kills germs and bonds with the skin to continue killing germs even after washing.  Please do not use if you have an allergy to CHG or antibacterial soaps. If your skin  becomes reddened/irritated stop using the CHG.  Do not shave (including legs and underarms) for at least 48 hours prior to first CHG shower. It is OK to shave your face.  Please follow these instructions carefully.   1. Shower the NIGHT BEFORE SURGERY and the MORNING OF SURGERY with CHG.   2. If you chose to wash your hair, wash your hair first as usual with your normal shampoo.  3. After you shampoo, rinse your hair and body thoroughly to remove the shampoo.  4. Use CHG as you would any other liquid soap. You can apply CHG directly to the skin and wash gently with a scrungie or a clean washcloth.   5. Apply the CHG Soap to your body ONLY FROM THE NECK DOWN.  Do not use on open wounds or open sores. Avoid contact with your eyes, ears, mouth and genitals (private parts). Wash Face and genitals (private parts)  with your normal soap.  6. Wash thoroughly, paying special attention to the area where your surgery will be performed.  7. Thoroughly rinse your body with warm water from the neck down.  8. DO NOT shower/wash with your normal soap after using and rinsing off the CHG Soap.  9. Pat yourself dry with a CLEAN TOWEL.  10. Wear CLEAN PAJAMAS to bed the night before surgery, wear comfortable clothes the morning of surgery  11. Place CLEAN SHEETS on your bed the night of your first shower and DO NOT SLEEP WITH PETS.  Day of Surgery: Do not apply any deodorants/lotions. Please wear clean clothes to the hospital/surgery center.    Please read over the following fact sheets that you were given. Pain Booklet, Coughing and Deep Breathing, MRSA Information and Surgical Site Infection Prevention

## 2017-05-15 NOTE — Progress Notes (Addendum)
PCP - Dr. Brigitte Pulse  Cardiologist - Denies  Chest x-ray - 12/26/16 (E)  EKG - 12/26/16 (E)  Stress Test - Denies  ECHO - Denies  Cardiac Cath - Denies  Sleep Study - No CPAP - None  LABS- 05/15/17: CBC, CMP, PT, PTT, T/S, UA  Pt to continue Aspirin, per the doctor.  Anesthesia- Yes- EKG  Pt denies having chest pain, sob, or fever at this time. All instructions explained to the pt, with a verbal understanding of the material. Pt agrees to go over the instructions while at home for a better understanding. The opportunity to ask questions was provided.

## 2017-05-16 LAB — TYPE AND SCREEN
ABO/RH(D): A POS
ANTIBODY SCREEN: NEGATIVE

## 2017-05-16 NOTE — Progress Notes (Signed)
Anesthesia Chart Review:  Pt is an 84 year old female scheduled for L CEA on 05/19/2017 with Curt Jews, MD  - PCP is Marton Redwood, MD  PMH includes:  HTN, CKD (stage III), anemia, carotid stenosis. Never smoker. BMI 20. S/p L hip IM nail 12/28/16.   Medications include: Amlodipine, ASA 325 mg, carvedilol, hydralazine  BP (!) 151/56   Pulse 77   Temp (!) 36.4 C   Resp 18   Ht 5\' 4"  (1.626 m)   Wt 116 lb 4.8 oz (52.8 kg)   SpO2 100%   BMI 19.96 kg/m   Preoperative labs reviewed.   - Cr 1.7, BUN 30; this is consistent with recent prior results.  - UA consistent with UTI - Pt started on Cipro by Dr. Luther Parody office.   1 view CXR 12/26/16: No active disease.  EKG 12/26/16: Sinus rhythm with PACs with Abberant conduction. LVH. Nonspecific ST abnormality  Carotid duplex 03/06/17:  1. R ICA stenosis 40-59% 2. L ICA stenosis 80-99%  If no changes, I anticipate pt can proceed with surgery as scheduled.   Willeen Cass, FNP-BC [facility] Short Stay Surgical Center/Anesthesiology Phone: [phone] 05/16/2017 3:58 PM

## 2017-05-18 NOTE — Anesthesia Preprocedure Evaluation (Addendum)
Anesthesia Evaluation  Patient identified by MRN, date of birth, ID band Patient awake    Reviewed: Allergy & Precautions, NPO status , Patient's Chart, lab work & pertinent test results, reviewed documented beta blocker date and time   Airway Mallampati: II  TM Distance: >3 FB Neck ROM: Full    Dental no notable dental hx. (+) Dental Advisory Given, Missing   Pulmonary neg pulmonary ROS,    Pulmonary exam normal breath sounds clear to auscultation       Cardiovascular hypertension, Pt. on medications and Pt. on home beta blockers + Peripheral Vascular Disease  Normal cardiovascular exam Rhythm:Regular Rate:Normal  '18 Carotid US - 80-99% left ICAS, 40-59% right ICAS   Neuro/Psych negative neurological ROS  negative psych ROS   GI/Hepatic negative GI ROS, Neg liver ROS,   Endo/Other  negative endocrine ROS  Renal/GU CRFRenal disease  negative genitourinary   Musculoskeletal negative musculoskeletal ROS (+)   Abdominal   Peds negative pediatric ROS (+)  Hematology  (+) anemia ,   Anesthesia Other Findings   Reproductive/Obstetrics negative OB ROS                           Anesthesia Physical  Anesthesia Plan  ASA: III  Anesthesia Plan: General   Post-op Pain Management:    Induction: Intravenous  PONV Risk Score and Plan: 3 and Ondansetron, Dexamethasone, Treatment may vary due to age or medical condition and Propofol infusion  Airway Management Planned: Oral ETT  Additional Equipment: Arterial line  Intra-op Plan:   Post-operative Plan: Extubation in OR  Informed Consent: I have reviewed the patients History and Physical, chart, labs and discussed the procedure including the risks, benefits and alternatives for the proposed anesthesia with the patient or authorized representative who has indicated his/her understanding and acceptance.   Dental advisory given  Plan  Discussed with: CRNA  Anesthesia Plan Comments: (Remifentanil infusion and propofol towards end of procedure for wakeup)       Anesthesia Quick Evaluation

## 2017-05-19 ENCOUNTER — Encounter (HOSPITAL_COMMUNITY): Payer: Self-pay

## 2017-05-19 ENCOUNTER — Inpatient Hospital Stay (HOSPITAL_COMMUNITY)
Admission: RE | Admit: 2017-05-19 | Discharge: 2017-05-20 | DRG: 039 | Disposition: A | Discharge status: Home / Self Care | Payer: PPO | Source: Ambulatory Visit | Attending: Vascular Surgery | Admitting: Vascular Surgery

## 2017-05-19 ENCOUNTER — Inpatient Hospital Stay (HOSPITAL_COMMUNITY): Payer: PPO | Admitting: Anesthesiology

## 2017-05-19 ENCOUNTER — Inpatient Hospital Stay (HOSPITAL_COMMUNITY): Payer: PPO | Admitting: Emergency Medicine

## 2017-05-19 ENCOUNTER — Encounter (HOSPITAL_COMMUNITY)
Admission: RE | Disposition: A | Discharge status: Home / Self Care | Payer: Self-pay | Source: Ambulatory Visit | Attending: Vascular Surgery

## 2017-05-19 DIAGNOSIS — Z7982 Long term (current) use of aspirin: Secondary | ICD-10-CM | POA: Diagnosis not present

## 2017-05-19 DIAGNOSIS — I6529 Occlusion and stenosis of unspecified carotid artery: Secondary | ICD-10-CM | POA: Diagnosis present

## 2017-05-19 DIAGNOSIS — I129 Hypertensive chronic kidney disease with stage 1 through stage 4 chronic kidney disease, or unspecified chronic kidney disease: Secondary | ICD-10-CM | POA: Diagnosis not present

## 2017-05-19 DIAGNOSIS — Z823 Family history of stroke: Secondary | ICD-10-CM | POA: Diagnosis not present

## 2017-05-19 DIAGNOSIS — Z8249 Family history of ischemic heart disease and other diseases of the circulatory system: Secondary | ICD-10-CM | POA: Diagnosis not present

## 2017-05-19 DIAGNOSIS — N189 Chronic kidney disease, unspecified: Secondary | ICD-10-CM | POA: Diagnosis not present

## 2017-05-19 DIAGNOSIS — I6522 Occlusion and stenosis of left carotid artery: Secondary | ICD-10-CM | POA: Diagnosis not present

## 2017-05-19 DIAGNOSIS — D631 Anemia in chronic kidney disease: Secondary | ICD-10-CM | POA: Diagnosis present

## 2017-05-19 DIAGNOSIS — D62 Acute posthemorrhagic anemia: Secondary | ICD-10-CM | POA: Diagnosis not present

## 2017-05-19 HISTORY — PX: ENDARTERECTOMY: SHX5162

## 2017-05-19 HISTORY — PX: PATCH ANGIOPLASTY: SHX6230

## 2017-05-19 LAB — CREATININE, SERUM
Creatinine, Ser: 1.77 mg/dL — ABNORMAL HIGH (ref 0.44–1.00)
GFR calc Af Amer: 29 mL/min — ABNORMAL LOW (ref >60)
GFR calc non Af Amer: 25 mL/min — ABNORMAL LOW (ref >60)

## 2017-05-19 LAB — CBC
HCT: 26.3 % — ABNORMAL LOW (ref 36.0–46.0)
Hemoglobin: 8.5 g/dL — ABNORMAL LOW (ref 12.0–15.0)
MCH: 30.5 pg (ref 26.0–34.0)
MCHC: 32.3 g/dL (ref 30.0–36.0)
MCV: 94.3 fL (ref 78.0–100.0)
PLATELETS: 161 10*3/uL (ref 150–400)
RBC: 2.79 MIL/uL — ABNORMAL LOW (ref 3.87–5.11)
RDW: 13.3 % (ref 11.5–15.5)
WBC: 8.8 10*3/uL (ref 4.0–10.5)

## 2017-05-19 SURGERY — ENDARTERECTOMY, CAROTID
Anesthesia: General | Site: Neck | Laterality: Left

## 2017-05-19 MED ORDER — HYDRALAZINE HCL 20 MG/ML IJ SOLN
5.0000 mg | INTRAMUSCULAR | Status: DC | PRN
Start: 2017-05-19 — End: 2017-05-20
  Filled 2017-05-19: qty 0.25

## 2017-05-19 MED ORDER — SODIUM CHLORIDE 0.9 % IV SOLN: INTRAVENOUS | Status: DC

## 2017-05-19 MED ORDER — SODIUM CHLORIDE 0.9 % IV SOLN
INTRAVENOUS | Status: DC
  Administered 2017-05-19: 18:00:00 via INTRAVENOUS

## 2017-05-19 MED ORDER — CHLORHEXIDINE GLUCONATE 4 % EX LIQD: 60.0000 mL | Freq: Once | CUTANEOUS | Status: DC

## 2017-05-19 MED ORDER — ONDANSETRON HCL 4 MG/2ML IJ SOLN: 4.0000 mg | Freq: Four times a day (QID) | INTRAMUSCULAR | Status: DC | PRN

## 2017-05-19 MED ORDER — ROCURONIUM BROMIDE 10 MG/ML (PF) SYRINGE
PREFILLED_SYRINGE | INTRAVENOUS | Status: AC
  Filled 2017-05-19: qty 5

## 2017-05-19 MED ORDER — FENTANYL CITRATE (PF) 250 MCG/5ML IJ SOLN
INTRAMUSCULAR | Status: DC | PRN
  Administered 2017-05-19: 100 ug via INTRAVENOUS
  Administered 2017-05-19: 50 ug via INTRAVENOUS

## 2017-05-19 MED ORDER — PHENYLEPHRINE 40 MCG/ML (10ML) SYRINGE FOR IV PUSH (FOR BLOOD PRESSURE SUPPORT)
PREFILLED_SYRINGE | INTRAVENOUS | Status: AC
  Filled 2017-05-19: qty 10

## 2017-05-19 MED ORDER — GUAIFENESIN-DM 100-10 MG/5ML PO SYRP: 15.0000 mL | ORAL_SOLUTION | ORAL | Status: DC | PRN

## 2017-05-19 MED ORDER — 0.9 % SODIUM CHLORIDE (POUR BTL) OPTIME
TOPICAL | Status: DC | PRN
  Administered 2017-05-19: 1000 mL

## 2017-05-19 MED ORDER — MORPHINE SULFATE (PF) 2 MG/ML IV SOLN: 0.5000 mg | INTRAVENOUS | Status: DC | PRN

## 2017-05-19 MED ORDER — SODIUM CHLORIDE 0.9 % IV SOLN
INTRAVENOUS | Status: DC | PRN
  Administered 2017-05-19: 10:00:00 500 mL

## 2017-05-19 MED ORDER — POTASSIUM CHLORIDE CRYS ER 20 MEQ PO TBCR: 20.0000 meq | EXTENDED_RELEASE_TABLET | Freq: Every day | ORAL | Status: DC | PRN

## 2017-05-19 MED ORDER — SODIUM CHLORIDE 0.9 % IV SOLN
0.0125 ug/kg/min | INTRAVENOUS | Status: AC
  Administered 2017-05-19: .1 ug/kg/min via INTRAVENOUS
  Filled 2017-05-19: qty 2000

## 2017-05-19 MED ORDER — PROPOFOL 10 MG/ML IV BOLUS
INTRAVENOUS | Status: DC | PRN
  Administered 2017-05-19: 30 mg via INTRAVENOUS
  Administered 2017-05-19: 120 mg via INTRAVENOUS

## 2017-05-19 MED ORDER — ASPIRIN EC 325 MG PO TBEC
325.0000 mg | DELAYED_RELEASE_TABLET | Freq: Every day | ORAL | Status: DC
  Administered 2017-05-19: 325 mg via ORAL
  Filled 2017-05-19: qty 1

## 2017-05-19 MED ORDER — DEXAMETHASONE SODIUM PHOSPHATE 10 MG/ML IJ SOLN
INTRAMUSCULAR | Status: AC
  Filled 2017-05-19: qty 1

## 2017-05-19 MED ORDER — SUCCINYLCHOLINE CHLORIDE 200 MG/10ML IV SOSY
PREFILLED_SYRINGE | INTRAVENOUS | Status: AC
  Filled 2017-05-19: qty 10

## 2017-05-19 MED ORDER — SODIUM CHLORIDE 0.9 % IV SOLN: 500.0000 mL | Freq: Once | INTRAVENOUS | Status: DC | PRN

## 2017-05-19 MED ORDER — ACETAMINOPHEN 325 MG PO TABS: 325.0000 mg | ORAL_TABLET | ORAL | Status: DC | PRN

## 2017-05-19 MED ORDER — DEXAMETHASONE SODIUM PHOSPHATE 10 MG/ML IJ SOLN
INTRAMUSCULAR | Status: DC | PRN
  Administered 2017-05-19: 5 mg via INTRAVENOUS

## 2017-05-19 MED ORDER — FENTANYL CITRATE (PF) 100 MCG/2ML IJ SOLN
25.0000 ug | INTRAMUSCULAR | Status: DC | PRN
Start: 2017-05-19 — End: 2017-05-19

## 2017-05-19 MED ORDER — LIDOCAINE 2% (20 MG/ML) 5 ML SYRINGE
INTRAMUSCULAR | Status: DC | PRN
  Administered 2017-05-19: 60 mg via INTRAVENOUS

## 2017-05-19 MED ORDER — CIPROFLOXACIN HCL 500 MG PO TABS: 500.0000 mg | ORAL_TABLET | Freq: Two times a day (BID) | ORAL | Status: DC

## 2017-05-19 MED ORDER — CEFAZOLIN SODIUM-DEXTROSE 2-4 GM/100ML-% IV SOLN
2.0000 g | Freq: Three times a day (TID) | INTRAVENOUS | Status: AC
  Administered 2017-05-19 – 2017-05-20 (×2): 2 g via INTRAVENOUS
  Filled 2017-05-19 (×2): qty 100

## 2017-05-19 MED ORDER — SODIUM CHLORIDE 0.9 % IV SOLN
1.5000 g | INTRAVENOUS | Status: AC
  Administered 2017-05-19: 1.5 g via INTRAVENOUS
  Filled 2017-05-19: qty 1.5

## 2017-05-19 MED ORDER — PROTAMINE SULFATE 10 MG/ML IV SOLN
INTRAVENOUS | Status: DC | PRN
  Administered 2017-05-19: 50 mg via INTRAVENOUS

## 2017-05-19 MED ORDER — ONDANSETRON HCL 4 MG/2ML IJ SOLN
INTRAMUSCULAR | Status: AC
  Filled 2017-05-19: qty 2

## 2017-05-19 MED ORDER — PANTOPRAZOLE SODIUM 40 MG PO TBEC
40.0000 mg | DELAYED_RELEASE_TABLET | Freq: Every day | ORAL | Status: DC
  Administered 2017-05-19: 40 mg via ORAL
  Filled 2017-05-19: qty 1

## 2017-05-19 MED ORDER — EPHEDRINE SULFATE-NACL 50-0.9 MG/10ML-% IV SOSY
PREFILLED_SYRINGE | INTRAVENOUS | Status: DC | PRN
  Administered 2017-05-19 (×3): 5 mg via INTRAVENOUS

## 2017-05-19 MED ORDER — MAGNESIUM SULFATE 2 GM/50ML IV SOLN: 2.0000 g | Freq: Every day | INTRAVENOUS | Status: DC | PRN

## 2017-05-19 MED ORDER — METOPROLOL TARTRATE 5 MG/5ML IV SOLN: 2.0000 mg | INTRAVENOUS | Status: DC | PRN

## 2017-05-19 MED ORDER — LIDOCAINE 2% (20 MG/ML) 5 ML SYRINGE
INTRAMUSCULAR | Status: AC
  Filled 2017-05-19: qty 5

## 2017-05-19 MED ORDER — LACTATED RINGERS IV SOLN
INTRAVENOUS | Status: DC
  Administered 2017-05-19: 08:00:00 via INTRAVENOUS

## 2017-05-19 MED ORDER — CARVEDILOL 6.25 MG PO TABS
6.2500 mg | ORAL_TABLET | Freq: Two times a day (BID) | ORAL | Status: DC
  Administered 2017-05-19 – 2017-05-20 (×2): 6.25 mg via ORAL
  Filled 2017-05-19 (×3): qty 1

## 2017-05-19 MED ORDER — OXYCODONE-ACETAMINOPHEN 5-325 MG PO TABS
1.0000 | ORAL_TABLET | ORAL | Status: DC | PRN
  Administered 2017-05-19: 1 via ORAL
  Filled 2017-05-19: qty 1

## 2017-05-19 MED ORDER — LABETALOL HCL 5 MG/ML IV SOLN
10.0000 mg | INTRAVENOUS | Status: DC | PRN
  Filled 2017-05-19: qty 4

## 2017-05-19 MED ORDER — ALUM & MAG HYDROXIDE-SIMETH 200-200-20 MG/5ML PO SUSP: 15.0000 mL | ORAL | Status: DC | PRN

## 2017-05-19 MED ORDER — FENTANYL CITRATE (PF) 250 MCG/5ML IJ SOLN
INTRAMUSCULAR | Status: AC
  Filled 2017-05-19: qty 5

## 2017-05-19 MED ORDER — HEPARIN SODIUM (PORCINE) 1000 UNIT/ML IJ SOLN
INTRAMUSCULAR | Status: DC | PRN
  Administered 2017-05-19: 5000 [IU] via INTRAVENOUS

## 2017-05-19 MED ORDER — PROPOFOL 10 MG/ML IV BOLUS
INTRAVENOUS | Status: AC
  Filled 2017-05-19: qty 20

## 2017-05-19 MED ORDER — ONDANSETRON HCL 4 MG/2ML IJ SOLN: 4.0000 mg | Freq: Once | INTRAMUSCULAR | Status: DC | PRN

## 2017-05-19 MED ORDER — OXYCODONE HCL 5 MG/5ML PO SOLN: 5.0000 mg | Freq: Once | ORAL | Status: DC | PRN

## 2017-05-19 MED ORDER — AMLODIPINE BESYLATE 10 MG PO TABS: 10.0000 mg | ORAL_TABLET | Freq: Every day | ORAL | Status: DC

## 2017-05-19 MED ORDER — CIPROFLOXACIN HCL 500 MG PO TABS
250.0000 mg | ORAL_TABLET | Freq: Two times a day (BID) | ORAL | Status: DC
  Administered 2017-05-20: 250 mg via ORAL
  Filled 2017-05-19: qty 1

## 2017-05-19 MED ORDER — SENNOSIDES-DOCUSATE SODIUM 8.6-50 MG PO TABS: 1.0000 | ORAL_TABLET | Freq: Every evening | ORAL | Status: DC | PRN

## 2017-05-19 MED ORDER — ASPIRIN EC 325 MG PO TBEC: 325.0000 mg | DELAYED_RELEASE_TABLET | Freq: Every day | ORAL | Status: DC

## 2017-05-19 MED ORDER — SUGAMMADEX SODIUM 200 MG/2ML IV SOLN
INTRAVENOUS | Status: DC | PRN
  Administered 2017-05-19: 125 mg via INTRAVENOUS

## 2017-05-19 MED ORDER — ENOXAPARIN SODIUM 40 MG/0.4ML ~~LOC~~ SOLN: 40.0000 mg | SUBCUTANEOUS | Status: DC

## 2017-05-19 MED ORDER — HYDROCODONE-ACETAMINOPHEN 5-325 MG PO TABS: 0.5000 | ORAL_TABLET | ORAL | 0 refills | Status: DC | PRN

## 2017-05-19 MED ORDER — PHENYLEPHRINE HCL 10 MG/ML IJ SOLN
INTRAVENOUS | Status: DC | PRN
  Administered 2017-05-19: 30 ug/min via INTRAVENOUS

## 2017-05-19 MED ORDER — BISACODYL 5 MG PO TBEC: 5.0000 mg | DELAYED_RELEASE_TABLET | Freq: Every day | ORAL | Status: DC | PRN

## 2017-05-19 MED ORDER — ACETAMINOPHEN 650 MG RE SUPP: 325.0000 mg | RECTAL | Status: DC | PRN

## 2017-05-19 MED ORDER — LACTATED RINGERS IV SOLN
INTRAVENOUS | Status: DC | PRN
  Administered 2017-05-19: 13:00:00 via INTRAVENOUS

## 2017-05-19 MED ORDER — DOCUSATE SODIUM 100 MG PO CAPS: 100.0000 mg | ORAL_CAPSULE | Freq: Every day | ORAL | Status: DC

## 2017-05-19 MED ORDER — HYDROCODONE-ACETAMINOPHEN 5-325 MG PO TABS
0.5000 | ORAL_TABLET | ORAL | Status: DC | PRN
Start: 2017-05-19 — End: 2017-05-20
  Administered 2017-05-19: 0.5 via ORAL
  Filled 2017-05-19: qty 1

## 2017-05-19 MED ORDER — ROCURONIUM BROMIDE 10 MG/ML (PF) SYRINGE
PREFILLED_SYRINGE | INTRAVENOUS | Status: DC | PRN
  Administered 2017-05-19: 10 mg via INTRAVENOUS
  Administered 2017-05-19: 50 mg via INTRAVENOUS

## 2017-05-19 MED ORDER — EPHEDRINE 5 MG/ML INJ
INTRAVENOUS | Status: AC
  Filled 2017-05-19: qty 10

## 2017-05-19 MED ORDER — HYDRALAZINE HCL 25 MG PO TABS
25.0000 mg | ORAL_TABLET | Freq: Two times a day (BID) | ORAL | Status: DC
  Administered 2017-05-19: 25 mg via ORAL
  Filled 2017-05-19: qty 1

## 2017-05-19 MED ORDER — LABETALOL HCL 5 MG/ML IV SOLN
INTRAVENOUS | Status: AC
  Filled 2017-05-19: qty 4

## 2017-05-19 MED ORDER — LIDOCAINE HCL (PF) 1 % IJ SOLN
INTRAMUSCULAR | Status: AC
  Filled 2017-05-19: qty 30

## 2017-05-19 MED ORDER — OXYCODONE HCL 5 MG PO TABS: 5.0000 mg | ORAL_TABLET | Freq: Once | ORAL | Status: DC | PRN

## 2017-05-19 MED ORDER — ONDANSETRON HCL 4 MG/2ML IJ SOLN
INTRAMUSCULAR | Status: DC | PRN
  Administered 2017-05-19: 4 mg via INTRAVENOUS

## 2017-05-19 MED ORDER — PHENOL 1.4 % MT LIQD: 1.0000 | OROMUCOSAL | Status: DC | PRN

## 2017-05-19 MED ORDER — ABALOPARATIDE 3120 MCG/1.56ML ~~LOC~~ SOPN: 80.0000 ug | PEN_INJECTOR | Freq: Every day | SUBCUTANEOUS | Status: DC

## 2017-05-19 SURGICAL SUPPLY — 41 items
CANISTER SUCT 3000ML PPV (MISCELLANEOUS) ×3 IMPLANT
CANNULA VESSEL 3MM 2 BLNT TIP (CANNULA) ×6 IMPLANT
CATH ROBINSON RED A/P 18FR (CATHETERS) ×3 IMPLANT
CLIP LIGATING EXTRA MED SLVR (CLIP) ×3 IMPLANT
CLIP LIGATING EXTRA SM BLUE (MISCELLANEOUS) ×3 IMPLANT
CRADLE DONUT ADULT HEAD (MISCELLANEOUS) ×3 IMPLANT
DECANTER SPIKE VIAL GLASS SM (MISCELLANEOUS) IMPLANT
DERMABOND ADHESIVE PROPEN (GAUZE/BANDAGES/DRESSINGS) ×2
DERMABOND ADVANCED (GAUZE/BANDAGES/DRESSINGS) ×2
DERMABOND ADVANCED .7 DNX12 (GAUZE/BANDAGES/DRESSINGS) ×1 IMPLANT
DERMABOND ADVANCED .7 DNX6 (GAUZE/BANDAGES/DRESSINGS) ×1 IMPLANT
DRAIN HEMOVAC 1/8 X 5 (WOUND CARE) IMPLANT
ELECT REM PT RETURN 9FT ADLT (ELECTROSURGICAL) ×3
ELECTRODE REM PT RTRN 9FT ADLT (ELECTROSURGICAL) ×1 IMPLANT
EVACUATOR SILICONE 100CC (DRAIN) IMPLANT
GLOVE SS BIOGEL STRL SZ 7.5 (GLOVE) ×1 IMPLANT
GLOVE SUPERSENSE BIOGEL SZ 7.5 (GLOVE) ×2
GOWN STRL REUS W/ TWL LRG LVL3 (GOWN DISPOSABLE) ×3 IMPLANT
GOWN STRL REUS W/TWL LRG LVL3 (GOWN DISPOSABLE) ×6
KIT BASIN OR (CUSTOM PROCEDURE TRAY) ×3 IMPLANT
KIT ROOM TURNOVER OR (KITS) ×3 IMPLANT
KIT SHUNT ARGYLE CAROTID ART 6 (VASCULAR PRODUCTS) IMPLANT
NEEDLE 22X1 1/2 (OR ONLY) (NEEDLE) IMPLANT
NEEDLE HYPO 25GX1X1/2 BEV (NEEDLE) ×3 IMPLANT
NS IRRIG 1000ML POUR BTL (IV SOLUTION) ×6 IMPLANT
PACK CAROTID (CUSTOM PROCEDURE TRAY) ×3 IMPLANT
PAD ARMBOARD 7.5X6 YLW CONV (MISCELLANEOUS) ×6 IMPLANT
PATCH HEMASHIELD 8X75 (Vascular Products) ×3 IMPLANT
SHUNT CAROTID BYPASS 10 (VASCULAR PRODUCTS) ×3 IMPLANT
SHUNT CAROTID BYPASS 12FRX15.5 (VASCULAR PRODUCTS) IMPLANT
SUT ETHILON 3 0 PS 1 (SUTURE) IMPLANT
SUT PROLENE 6 0 CC (SUTURE) ×9 IMPLANT
SUT SILK 3 0 (SUTURE)
SUT SILK 3-0 18XBRD TIE 12 (SUTURE) IMPLANT
SUT VIC AB 3-0 SH 27 (SUTURE) ×4
SUT VIC AB 3-0 SH 27X BRD (SUTURE) ×2 IMPLANT
SUT VICRYL 4-0 PS2 18IN ABS (SUTURE) ×3 IMPLANT
SYR CONTROL 10ML LL (SYRINGE) ×3 IMPLANT
TOWEL GREEN STERILE (TOWEL DISPOSABLE) ×3 IMPLANT
TRAY CATH 16FR W/PLASTIC CATH (SET/KITS/TRAYS/PACK) ×3 IMPLANT
WATER STERILE IRR 1000ML POUR (IV SOLUTION) ×3 IMPLANT

## 2017-05-19 NOTE — Transfer of Care (Signed)
Immediate Anesthesia Transfer of Care Note  Patient: Kathleen Jimenez  Procedure(s) Performed: LEFT CAROTID ENDARTERECTOMY CAROTID (Left Neck) PATCH ANGIOPLASTY OF LEFT CAROTID ARTERY USING HEMASHIELD PLATIUM FINESSE PATCH (Left Neck)  Patient Location: PACU  Anesthesia Type:General  Level of Consciousness: awake, alert , oriented and patient cooperative  Airway & Oxygen Therapy: Patient Spontanous Breathing  Post-op Assessment: Report given to RN, Post -op Vital signs reviewed and stable, Patient moving all extremities X 4 and Patient able to stick tongue midline  Post vital signs: Reviewed and stable  Last Vitals:  Vitals:   05/19/17 0802  BP: (!) 144/64  Pulse: 65  Resp: 18  Temp: 36.6 C  SpO2: 100%    Last Pain: There were no vitals filed for this visit.       Complications: No apparent anesthesia complications

## 2017-05-19 NOTE — Anesthesia Procedure Notes (Signed)
Arterial Line Insertion Start/End3/03/2017 8:42 AM Performed by: Audry Pili, MD, Julieta Bellini, CRNA, anesthesiologist  Patient location: Pre-op. Preanesthetic checklist: patient identified, IV checked, site marked, risks and benefits discussed, surgical consent, monitors and equipment checked, pre-op evaluation, timeout performed and anesthesia consent Lidocaine 1% used for infiltration Left, radial was placed Catheter size: 20 G Hand hygiene performed  and maximum sterile barriers used   Attempts: 1 Procedure performed without using ultrasound guided technique. Following insertion, dressing applied and Biopatch. Post procedure assessment: normal  Patient tolerated the procedure well with no immediate complications.

## 2017-05-19 NOTE — Anesthesia Procedure Notes (Signed)
Procedure Name: Intubation Date/Time: 05/19/2017 12:30 PM Performed by: Julieta Bellini, CRNA Pre-anesthesia Checklist: Patient identified, Emergency Drugs available, Suction available and Patient being monitored Patient Re-evaluated:Patient Re-evaluated prior to induction Oxygen Delivery Method: Circle system utilized Preoxygenation: Pre-oxygenation with 100% oxygen Induction Type: IV induction Ventilation: Mask ventilation without difficulty Laryngoscope Size: Mac and 3 Grade View: Grade I Tube type: Oral Tube size: 7.0 mm Number of attempts: 1 Airway Equipment and Method: Stylet Placement Confirmation: ETT inserted through vocal cords under direct vision,  positive ETCO2 and breath sounds checked- equal and bilateral Secured at: 20 cm Tube secured with: Tape Dental Injury: Teeth and Oropharynx as per pre-operative assessment

## 2017-05-19 NOTE — Interval H&P Note (Signed)
History and Physical Interval Note:  05/19/2017 7:22 AM  Kathleen Jimenez  has presented today for surgery, with the diagnosis of LEFT CAROTID STENOSIS  The various methods of treatment have been discussed with the patient and family. After consideration of risks, benefits and other options for treatment, the patient has consented to  Procedure(s): ENDARTERECTOMY CAROTID LEFT (Left) as a surgical intervention .  The patient's history has been reviewed, patient examined, no change in status, stable for surgery.  I have reviewed the patient's chart and labs.  Questions were answered to the patient's satisfaction.     Curt Jews

## 2017-05-19 NOTE — Progress Notes (Signed)
[facility]  Subjective  - Doing well without much discomfort.   Objective 110/72 (!) 58 98.4 F (36.9 C) (Tympanic) 13 98%  Intake/Output Summary (Last 24 hours) at 05/19/2017 1650 Last data filed at 05/19/2017 1540 Gross per 24 hour  Intake 1700 ml  Output 500 ml  Net 1200 ml    Left neck incision is soft without hematoma No tongue deviation and smile is symmetric Moving all 4 extremities Heart RRR Lungs non labored breathing  Assessment/Planning: S/P left CEA  Stable condition and comfortable Plan for discharge tomorrow.  Roxy Horseman 05/19/2017 4:50 PM --  Laboratory Lab Results: No results for input(s): WBC, HGB, HCT, PLT in the last 72 hours. BMET No results for input(s): NA, K, CL, CO2, GLUCOSE, BUN, CREATININE, CALCIUM in the last 72 hours.  COAG Lab Results  Component Value Date   INR 1.07 05/15/2017   INR 1.04 12/26/2016   No results found for: PTT

## 2017-05-19 NOTE — Discharge Instructions (Signed)
   [facility]  Discharge Instructions   Carotid Endarterectomy (CEA)  Please refer to the following instructions for your post-procedure care. Your surgeon or physician assistant will discuss any changes with you.  Activity  You are encouraged to walk as much as you can. You can slowly return to normal activities but must avoid strenuous activity and heavy lifting until your doctor tell you it's OK. Avoid activities such as vacuuming or swinging a golf club. You can drive after one week if you are comfortable and you are no longer taking prescription pain medications. It is normal to feel tired for serval weeks after your surgery. It is also normal to have difficulty with sleep habits, eating, and bowel movements after surgery. These will go away with time.  Bathing/Showering  You may shower after you come home. Do not soak in a bathtub, hot tub, or swim until the incision heals completely.  Incision Care  Shower every day. Clean your incision with mild soap and water. Pat the area dry with a clean towel. You do not need a bandage unless otherwise instructed. Do not apply any ointments or creams to your incision. You may have skin glue on your incision. Do not peel it off. It will come off on its own in about one week. Your incision may feel thickened and raised for several weeks after your surgery. This is normal and the skin will soften over time. For Men Only: It's OK to shave around the incision but do not shave the incision itself for 2 weeks. It is common to have numbness under your chin that could last for several months.  Diet  Resume your normal diet. There are no special food restrictions following this procedure. A low fat/low cholesterol diet is recommended for all patients with vascular disease. In order to heal from your surgery, it is CRITICAL to get adequate nutrition. Your body requires vitamins, minerals, and protein. Vegetables are the best  source of vitamins and minerals. Vegetables also provide the perfect balance of protein. Processed food has little nutritional value, so try to avoid this.        Medications  Resume taking all of your medications unless your doctor or physician assistant tells you not to. If your incision is causing pain, you may take over-the- counter pain relievers such as acetaminophen (Tylenol). If you were prescribed a stronger pain medication, please be aware these medications can cause nausea and constipation. Prevent nausea by taking the medication with a snack or meal. Avoid constipation by drinking plenty of fluids and eating foods with a high amount of fiber, such as fruits, vegetables, and grains. Do not take Tylenol if you are taking prescription pain medications.  Follow Up  Our office will schedule a follow up appointment 2-3 weeks following discharge.  Please call us immediately for any of the following conditions  Increased pain, redness, drainage (pus) from your incision site. Fever of 101 degrees or higher. If you should develop stroke (slurred speech, difficulty swallowing, weakness on one side of your body, loss of vision) you should call 911 and go to the nearest emergency room.  Reduce your risk of vascular disease:  Stop smoking. If you would like help call [facility] at 1-800-QUIT-NOW (1-800-784-8669) or [place] at 336-586-4000. Manage your cholesterol Maintain a desired weight Control your diabetes Keep your blood pressure down  If you have any questions, please call the office at [phone].   

## 2017-05-19 NOTE — Op Note (Signed)
    OPERATIVE REPORT  DATE OF SURGERY: 05/19/2017  PATIENT: Kathleen Jimenez, 84 y.o. female MRN: [id number]  DOB: 07/24/1931  PRE-OPERATIVE DIAGNOSIS: Severe asymptomatic left internal carotid artery stenosis  POST-OPERATIVE DIAGNOSIS:  Same  PROCEDURE: Left carotid endarterectomy and Dacron patch angioplasty  SURGEON:  Curt Jews, M.D.  PHYSICIAN ASSISTANT: Dr. Adele Barthel Matt Eveland PA-C  ANESTHESIA: General  EBL: 100 ml  Total I/O In: 1600 [I.V.:1600] Out: 500 [Urine:400; Blood:100]  BLOOD ADMINISTERED: None  DRAINS: None  SPECIMEN: None  COUNTS CORRECT:  YES  PLAN OF CARE: PACU neurologically intact  PATIENT DISPOSITION:  PACU - hemodynamically stable  PROCEDURE DETAILS: The patient was taken to the operating placed supine position where the area of the left neck was prepped and draped in the usual sterile fashion.  An incision was made anterior to the sternocleidomastoid and carried down through the platysma with electrocautery.  The sternocleidomastoid was reflected posteriorly and the carotid sheath was opened.  Facial vein was ligated with 2-0 silk ties and divided.  Dissection was continued onto the bifurcation location of the carotid artery.  The vagus and hypoglossal nerves were identified and preserved.  The common carotid artery was encircled with an umbilical tape and Rummel tourniquet.  The internal carotid was then encircled with umbilical tape and Rummel tourniquet.  The superior thyroid artery was encircled with a 2-0 silk Potts tie and the external carotid was encircled with a blue vessel loop.  The patient was given 5000 units of intravenous heparin and after adequate circulation time the internal/external and common carotid arteries were occluded.  The common carotid artery was opened with an 11 blade and sent along [name] with Potts scissors.  The 10 shunt was passed up the internal carotid and then down the common carotid where it was secured with a Rummel  tourniquet.  The common carotid artery was opened with an 11 blade and extended longitudinally with Potts scissors.  The endarterectomy was cut on the common carotid artery and the plaque was divided proximally with Potts scissors.  The endarterectomy was continued on the bifurcation.  The external carotid was endarterectomized with an eversion technique and the internal carotid was endarterectomized in an open fashion.  Remaining atheromatous debris was removed from the endarterectomy plane.  A Finesse Dacron Hemashield graft was brought onto the field as a patch angioplasty with a running 6-0 Prolene suture.  Prior to completion of the closure the shunt was removed in the usual flushing maneuvers were undertaken.  The anastomosis was completed and flow was external and then the internal carotid arteries.  Excellent flow characteristics were noted with hand-held Doppler in the internal/external and common carotid arteries.  Patient was given 50 mg of protamine to reverse the heparin.  The wounds irrigated with saline.  Hemostasis elect cautery.  Wounds were closed with 3-0 Vicryl to reapproximate the sternocleidomastoid over the carotid sheath.  The platysma was closed with a running 3-0 Vicryl suture and finally the skin was closed with a 4-0 subcuticular Vicryl suture.  The patient was awakened in the operating room neurologically intact and was transferred to the recovery room in stable condition   [name], M.D., [facility] 05/19/2017 2:48 PM

## 2017-05-20 LAB — CBC
HEMATOCRIT: 23.3 % — AB (ref 36.0–46.0)
Hemoglobin: 7.8 g/dL — ABNORMAL LOW (ref 12.0–15.0)
MCH: 31.6 pg (ref 26.0–34.0)
MCHC: 33.5 g/dL (ref 30.0–36.0)
MCV: 94.3 fL (ref 78.0–100.0)
Platelets: 138 10*3/uL — ABNORMAL LOW (ref 150–400)
RBC: 2.47 MIL/uL — ABNORMAL LOW (ref 3.87–5.11)
RDW: 13.5 % (ref 11.5–15.5)
WBC: 4.8 10*3/uL (ref 4.0–10.5)

## 2017-05-20 LAB — BASIC METABOLIC PANEL
Anion gap: 9 (ref 5–15)
BUN: 33 mg/dL — AB (ref 6–20)
CHLORIDE: 110 mmol/L (ref 101–111)
CO2: 20 mmol/L — AB (ref 22–32)
CREATININE: 1.97 mg/dL — AB (ref 0.44–1.00)
Calcium: 8.1 mg/dL — ABNORMAL LOW (ref 8.9–10.3)
GFR calc Af Amer: 26 mL/min — ABNORMAL LOW (ref >60)
GFR calc non Af Amer: 22 mL/min — ABNORMAL LOW (ref >60)
Glucose, Bld: 166 mg/dL — ABNORMAL HIGH (ref 65–99)
Potassium: 4.5 mmol/L (ref 3.5–5.1)
SODIUM: 139 mmol/L (ref 135–145)

## 2017-05-20 NOTE — Progress Notes (Signed)
Discharge instructions, including follow up with Dr. Donnetta Hutching, signs and symptoms of infection, and medications, reviewed with patient and sons. Script for Vicodin prn given to pt. Both peripheral IVs removed. Pt assisted to main entrance by NT and sons via wheelchair, with all belongings.

## 2017-05-20 NOTE — Progress Notes (Signed)
Placed call to on-call physician regarding HR in 40s when patient is asleep. Orders given, will continue to monitor.

## 2017-05-20 NOTE — Progress Notes (Addendum)
[facility]  Subjective  - Doing well.  Slept well over night without any new complaints.   Objective (!) 123/58 60 98 F (36.7 C) (Oral) 20 98%  Intake/Output Summary (Last 24 hours) at 05/20/2017 0737 Last data filed at 05/20/2017 0006 Gross per 24 hour  Intake 2329 ml  Output 800 ml  Net 1529 ml   Left neck incision clean and dry without hematoma Smile symmetric, no tongue deviation Moving all 4 dexterities Speech clear HR 60-70 this am Heart RRR Lungs non labored     Assessment/Planning: POD # 1 left CEA  She has CKD with slight increase in Cr 1.97 up from 1.7 UO good, chronic anemia pre-op 8.5 now 7.8.  Surgical blood loss was 100 cc.  Asymptomatic without dizziness, feels " normal." IV fluids 100 cc/hr NS post op over night. Plan to ambulate after breakfast.  [name] cardia over night 40 BPM has resolved with time and activity. Tolerating PO's and independently voided. Plan to discharge home later this am.  Roxy Horseman 05/20/2017 7:37 AM  Addendum  I have independently interviewed and examined the patient, and I agree with the physician assistant's findings.  Bradycardiac when asleep.  Normal HR at this point with intact HD.  Neuro completely intact.  No neck hematoma.  Intraop blood loss was only 100-200 cc so I suspect some hemodilution and baseline anemia.  Pt is complete asx from her anemia at this point, so I don't see any benefit to transfusing.  Would have pt follow with her PCP for further anemia work-up.  - ok to D/C - F/U with Dr. Donnetta Hutching in 2 weeks   Adele Barthel, MD, FACS [facility]: [phone] Pager: [phone]  05/20/2017, 8:27 AM   --  Laboratory Lab Results: Recent Labs    05/19/17 1817 05/20/17 0433  WBC 8.8 4.8  HGB 8.5* 7.8*  HCT 26.3* 23.3*  PLT 161 138*   BMET Recent Labs    05/19/17 1817 05/20/17 0433  NA  --  139  K  --  4.5  CL  --  110   CO2  --  20*  GLUCOSE  --  166*  BUN  --  33*  CREATININE 1.77* 1.97*  CALCIUM  --  8.1*    COAG Lab Results  Component Value Date   INR 1.07 05/15/2017   INR 1.04 12/26/2016   No results found for: PTT

## 2017-05-21 NOTE — Anesthesia Postprocedure Evaluation (Signed)
Anesthesia Post Note  Patient: Kathleen Jimenez  Procedure(s) Performed: LEFT CAROTID ENDARTERECTOMY CAROTID (Left Neck) PATCH ANGIOPLASTY OF LEFT CAROTID ARTERY USING HEMASHIELD PLATIUM FINESSE PATCH (Left Neck)     Patient location during evaluation: PACU Anesthesia Type: General Level of consciousness: awake and alert Pain management: pain level controlled Vital Signs Assessment: post-procedure vital signs reviewed and stable Respiratory status: spontaneous breathing, nonlabored ventilation and respiratory function stable Cardiovascular status: blood pressure returned to baseline and stable Postop Assessment: no apparent nausea or vomiting Anesthetic complications: no    Last Vitals:  Vitals:   05/20/17 0402 05/20/17 0735  BP: (!) 123/59 (!) 123/58  Pulse: 64 60  Resp: 14 20  Temp: 36.8 C 36.7 C  SpO2: 98% 98%    Last Pain:  Vitals:   05/20/17 0735  TempSrc: Oral  PainSc:                  Audry Pili

## 2017-05-22 ENCOUNTER — Telehealth: Payer: Self-pay | Admitting: Vascular Surgery

## 2017-05-22 ENCOUNTER — Encounter (HOSPITAL_COMMUNITY): Payer: Self-pay | Admitting: Vascular Surgery

## 2017-05-22 NOTE — Telephone Encounter (Signed)
-----   Message from Mena Goes, RN sent at 05/19/2017  9:37 PM EST ----- Regarding: 2 weeks post CEA   ----- Message ----- From: Ulyses Amor, PA-C Sent: 05/19/2017   4:49 PM To: [facility] Charge Pool  S/P left CEA f/u with Dr. Donnetta Hutching in 2 weeks

## 2017-05-22 NOTE — Telephone Encounter (Signed)
Sched appt 06/13/17 at 2:00. Spoke to pt's son to inform them of appt.

## 2017-05-23 MED FILL — TYMLOS 3120 MCG/1.56ML SOPN: 3120 | 30 days supply | Qty: 2 | Fill #0

## 2017-05-23 NOTE — Discharge Summary (Signed)
[facility] Discharge Summary   Patient ID:  Kathleen Jimenez MRN: [id number] DOB/AGE: 1932/02/16 84 y.o.  Admit date: 05/19/2017 Discharge date: 05/20/2017 Date of Surgery: 05/19/2017 Surgeon: Surgeon(s): Early, Arvilla Meres, MD Conrad Crofton, MD  Admission Diagnosis: LEFT CAROTID STENOSIS  Discharge Diagnoses:  LEFT CAROTID STENOSIS  Secondary Diagnoses: Past Medical History:  Diagnosis Date  . Anemia   . Carotid stenosis, left   . Chronic kidney disease   . Hypertension     Procedure(s): LEFT CAROTID ENDARTERECTOMY CAROTID PATCH ANGIOPLASTY OF LEFT CAROTID ARTERY USING HEMASHIELD PLATIUM FINESSE PATCH  Discharged Condition: good  HPI: 84 y/o female with asymptomatic left internal carotid  high-grade greater than 80% left internal carotid artery stenosis and right 40-59% carotid stenosis.  She is scheduled for left CEA with Dr. Donnetta Jimenez.     Hospital Course:  Kathleen Jimenez is a 84 y.o. female is S/P  Procedure(s): LEFT CAROTID ENDARTERECTOMY CAROTID PATCH ANGIOPLASTY OF LEFT CAROTID ARTERY USING HEMASHIELD PLATIUM FINESSE PATCH She has CKD with slight increase in Cr 1.97 up from 1.7 UO good, chronic anemia pre-op 8.5 now 7.8.  Surgical blood loss was 100 cc.  Asymptomatic without dizziness, feels " normal."   I suspect some hemodilution and baseline anemia.  IV fluids 100 cc/hr NS post op over night. Plan to ambulate after breakfast.  Kathleen Jimenez cardia over night 40 BPM has resolved with time and activity. Tolerating PO's and independently voided.   Pt is complete asx from her anemia at this point, so I don't see any benefit to transfusing.  Would have pt follow with her PCP for further anemia work-up. No neurologic deficits.  Plan to discharge home later this am.      Significant Diagnostic Studies: CBC Lab Results  Component Value Date   WBC 4.8 05/20/2017   HGB 7.8 (L) 05/20/2017   HCT 23.3 (L) 05/20/2017   MCV 94.3 05/20/2017   PLT 138 (L) 05/20/2017     BMET    Component Value Date/Time   NA 139 05/20/2017 0433   K 4.5 05/20/2017 0433   CL 110 05/20/2017 0433   CO2 20 (L) 05/20/2017 0433   GLUCOSE 166 (H) 05/20/2017 0433   BUN 33 (H) 05/20/2017 0433   CREATININE 1.97 (H) 05/20/2017 0433   CALCIUM 8.1 (L) 05/20/2017 0433   GFRNONAA 22 (L) 05/20/2017 0433   GFRAA 26 (L) 05/20/2017 0433   COAG Lab Results  Component Value Date   INR 1.07 05/15/2017   INR 1.04 12/26/2016     Disposition:  Discharge to :Home Discharge Instructions    Call MD for:  redness, tenderness, or signs of infection (pain, swelling, bleeding, redness, odor or green/yellow discharge around incision site)   Complete by:  As directed    Call MD for:  severe or increased pain, loss or decreased feeling  in affected limb(s)   Complete by:  As directed    Call MD for:  temperature >100.5   Complete by:  As directed    Discharge instructions   Complete by:  As directed    You may shower 24 hours after surgery.   Discharge patient   Complete by:  As directed    Discharge disposition:  01-Home or Self Care   Discharge patient date:  05/20/2017   Resume previous diet   Complete by:  As directed      Allergies as of 05/20/2017   No Known Allergies     Medication List  STOP taking these medications   ciprofloxacin 250 MG tablet Commonly known as:  CIPRO     TAKE these medications   amLODipine 10 MG tablet Commonly known as:  NORVASC Take 1 tablet (10 mg total) by mouth daily.   aspirin 325 MG EC tablet Take 325 mg by mouth daily.   carvedilol 6.25 MG tablet Commonly known as:  COREG Take 1 tablet (6.25 mg total) by mouth 2 (two) times daily with a meal.   hydrALAZINE 25 MG tablet Commonly known as:  APRESOLINE Take 1 tablet (25 mg total) by mouth every 8 (eight) hours. What changed:  when to take this   HYDROcodone-acetaminophen 5-325 MG tablet Commonly known as:  NORCO/VICODIN Take 0.5-1 tablets by mouth every 4 (four) hours as  needed for moderate pain.   TYMLOS 3120 MCG/1.56ML Sopn Generic drug:  Abaloparatide Inject 80 mcg into the skin daily.   Vitamin D 2000 units tablet Take 2,000 Units by mouth daily.      Verbal and written Discharge instructions given to the patient. Wound care per Discharge AVS Follow-up Information    Early, Arvilla Meres, MD Follow up in 2 week(s).   Specialties:  Vascular Surgery, Cardiology Why:  office will call Contact information: [place] [id number] [phone]           Signed: Roxy Jimenez 05/23/2017, 11:28 AM --- For VQI Registry use --- Instructions: Press F2 to tab through selections.  Delete question if not applicable.   Modified Rankin score at D/C (0-6): Rankin Score=0  IV medication needed for:  1. Hypertension: No 2. Hypotension: No  Post-op Complications: No  1. Post-op CVA or TIA: No  If yes: Event classification (right eye, left eye, right cortical, left cortical, verterobasilar, other):   If yes: Timing of event (intra-op, <6 hrs post-op, >=6 hrs post-op, unknown):   2. CN injury: No  If yes: CN  injuried   3. Myocardial infarction: No  If yes: Dx by (EKG or clinical, Troponin):   4.  CHF: No  5.  Dysrhythmia (new): No  6. Wound infection: No  7. Reperfusion symptoms: No  8. Return to OR: No  If yes: return to OR for (bleeding, neurologic, other CEA incision, other):   Discharge medications: Statin use:  No  for medical reason   ASA use:  Yes Beta blocker use:  Yes ACE-Inhibitor use:  No  for medical reason   P2Y12 Antagonist use: [x ] None, [ ]  Plavix, [ ]  Plasugrel, [ ]  Ticlopinine, [ ]  Ticagrelor, [ ]  Other, [ ]  No for medical reason, [ ]  Non-compliant, [ ]  Not-indicated Anti-coagulant use:  [x ] None, [ ]  Warfarin, [ ]  Rivaroxaban, [ ]  Dabigatran, [ ]  Other, [ ]  No for medical reason, [ ]  Non-compliant, [ ]  Not-indicated

## 2017-06-13 ENCOUNTER — Encounter: Payer: Self-pay | Admitting: Vascular Surgery

## 2017-06-13 ENCOUNTER — Other Ambulatory Visit: Payer: Self-pay

## 2017-06-13 ENCOUNTER — Encounter: Payer: PPO | Admitting: Vascular Surgery

## 2017-06-13 ENCOUNTER — Ambulatory Visit (INDEPENDENT_AMBULATORY_CARE_PROVIDER_SITE_OTHER): Payer: PPO | Admitting: Vascular Surgery

## 2017-06-13 VITALS — BP 162/79 | HR 68 | Temp 97.5°F | Resp 20 | Ht 64.0 in | Wt 115.0 lb

## 2017-06-13 DIAGNOSIS — Z9889 Other specified postprocedural states: Secondary | ICD-10-CM

## 2017-06-13 NOTE — Progress Notes (Signed)
  POST OPERATIVE OFFICE NOTE    CC:  F/u for surgery  HPI:  This is a 84 y.o. female who is s/p left carotid endarterectomy by Dr. Donnetta Hutching on 05/19/17.  She states that after surgery she had a little bit of ear pain, but that quickly resolved.  She has not needed pain medication and only took a couple of Tylenol.  She has not had any issues with her vision, speech or weakness/clumsiness since surgery.   She states she has a piece of suture at the end of her incision.    She takes a daily aspirin.   No Known Allergies  Current Outpatient Medications  Medication Sig Dispense Refill  . Abaloparatide (TYMLOS) 3120 MCG/1.56ML SOPN Inject 80 mcg into the skin daily.    Marland Kitchen amLODipine (NORVASC) 10 MG tablet Take 1 tablet (10 mg total) by mouth daily. 30 tablet 0  . aspirin 325 MG EC tablet Take 325 mg by mouth daily.     . carvedilol (COREG) 6.25 MG tablet Take 1 tablet (6.25 mg total) by mouth 2 (two) times daily with a meal. 60 tablet 0  . Cholecalciferol (VITAMIN D) 2000 units tablet Take 2,000 Units by mouth daily.    . hydrALAZINE (APRESOLINE) 25 MG tablet Take 1 tablet (25 mg total) by mouth every 8 (eight) hours. (Patient taking differently: Take 25 mg by mouth 2 (two) times daily. ) 90 tablet 0   No current facility-administered medications for this visit.      ROS:  See HPI  Physical Exam:  Vitals:   06/13/17 1618 06/13/17 1621  BP: (!) 172/83 (!) 162/79  Pulse: 68   Resp: 20   Temp: (!) 97.5 F (36.4 C)   SpO2: 99%     Incision:  Well healed with spitting stitch distal incision Extremities:  Moving all extremities equally Neuro: in tact; tongue is midline   Assessment/Plan:  This is a 84 y.o. female who is s/p: Left carotid endarterectomy  -pt is doing well since surgery and has not had any neurological events.  -she is taking aspirin 325mg  daily and will cut this back to 81mg  daily -she is not on a statin-given she has had a carotid endarterectomy, she may benefit from  a statin, but will defer to Dr. Brigitte Pulse for this.  -she will f/u with Dr. Donnetta Hutching in 6 months with carotid duplex -she will contact us sooner if she has any issues    Leontine Locket, PA-C [facility] [phone]  Clinic MD:  Pt seen and examined with Dr. Donnetta Hutching  I have examined the patient, reviewed and agree with above.  Doing extremely well following surgery.  No neurologic deficits.  Minimal incisional discomfort and no peri-incisional numbness.  We will see her again in 6 months carotid duplex  Curt Jews, MD 06/13/2017 5:07 PM

## 2017-06-20 DIAGNOSIS — I10 Essential (primary) hypertension: Secondary | ICD-10-CM | POA: Diagnosis not present

## 2017-06-20 DIAGNOSIS — R82998 Other abnormal findings in urine: Secondary | ICD-10-CM | POA: Diagnosis not present

## 2017-06-20 DIAGNOSIS — Z8639 Personal history of other endocrine, nutritional and metabolic disease: Secondary | ICD-10-CM | POA: Diagnosis not present

## 2017-06-20 DIAGNOSIS — M810 Age-related osteoporosis without current pathological fracture: Secondary | ICD-10-CM | POA: Diagnosis not present

## 2017-06-22 ENCOUNTER — Other Ambulatory Visit: Payer: Self-pay

## 2017-06-22 DIAGNOSIS — I6522 Occlusion and stenosis of left carotid artery: Secondary | ICD-10-CM

## 2017-06-26 DIAGNOSIS — Z23 Encounter for immunization: Secondary | ICD-10-CM | POA: Diagnosis not present

## 2017-06-26 DIAGNOSIS — Z6822 Body mass index (BMI) 22.0-22.9, adult: Secondary | ICD-10-CM | POA: Diagnosis not present

## 2017-06-26 DIAGNOSIS — N184 Chronic kidney disease, stage 4 (severe): Secondary | ICD-10-CM | POA: Diagnosis not present

## 2017-06-26 DIAGNOSIS — N390 Urinary tract infection, site not specified: Secondary | ICD-10-CM | POA: Diagnosis not present

## 2017-06-26 DIAGNOSIS — D649 Anemia, unspecified: Secondary | ICD-10-CM | POA: Diagnosis not present

## 2017-06-26 DIAGNOSIS — E7849 Other hyperlipidemia: Secondary | ICD-10-CM | POA: Diagnosis not present

## 2017-06-26 DIAGNOSIS — I10 Essential (primary) hypertension: Secondary | ICD-10-CM | POA: Diagnosis not present

## 2017-06-26 DIAGNOSIS — I6523 Occlusion and stenosis of bilateral carotid arteries: Secondary | ICD-10-CM | POA: Diagnosis not present

## 2017-06-26 DIAGNOSIS — Z1389 Encounter for screening for other disorder: Secondary | ICD-10-CM | POA: Diagnosis not present

## 2017-06-26 DIAGNOSIS — R7301 Impaired fasting glucose: Secondary | ICD-10-CM | POA: Diagnosis not present

## 2017-06-26 DIAGNOSIS — Z0000 Encounter for general adult medical examination without abnormal findings: Secondary | ICD-10-CM | POA: Diagnosis not present

## 2017-06-26 DIAGNOSIS — M810 Age-related osteoporosis without current pathological fracture: Secondary | ICD-10-CM | POA: Diagnosis not present

## 2017-07-03 MED FILL — FORTEO 600 MCG/2.4 ML PEN I: 600 | 30 days supply | Qty: 2 | Fill #0

## 2017-08-03 MED FILL — FORTEO 600 MCG/2.4 ML PEN I: 600 | 30 days supply | Qty: 2 | Fill #1

## 2017-09-04 MED FILL — FORTEO 600 MCG/2.4 ML PEN I: 600 | 30 days supply | Qty: 2 | Fill #2

## 2017-09-18 DIAGNOSIS — I10 Essential (primary) hypertension: Secondary | ICD-10-CM | POA: Diagnosis not present

## 2017-10-09 MED FILL — FORTEO 600 MCG/2.4 ML PEN I: 600 | 30 days supply | Qty: 2 | Fill #3

## 2017-11-14 MED FILL — FORTEO 600 MCG/2.4 ML PEN I: 600 | 30 days supply | Qty: 2 | Fill #4

## 2017-12-19 ENCOUNTER — Encounter (HOSPITAL_COMMUNITY): Payer: PPO

## 2017-12-19 ENCOUNTER — Ambulatory Visit: Payer: PPO | Admitting: Vascular Surgery

## 2017-12-20 MED FILL — FORTEO 600 MCG/2.4 ML PEN I: 600 | 28 days supply | Qty: 2 | Fill #5

## 2018-01-10 DIAGNOSIS — Z6821 Body mass index (BMI) 21.0-21.9, adult: Secondary | ICD-10-CM | POA: Diagnosis not present

## 2018-01-10 DIAGNOSIS — E7849 Other hyperlipidemia: Secondary | ICD-10-CM | POA: Diagnosis not present

## 2018-01-10 DIAGNOSIS — I10 Essential (primary) hypertension: Secondary | ICD-10-CM | POA: Diagnosis not present

## 2018-01-10 DIAGNOSIS — I129 Hypertensive chronic kidney disease with stage 1 through stage 4 chronic kidney disease, or unspecified chronic kidney disease: Secondary | ICD-10-CM | POA: Diagnosis not present

## 2018-01-10 DIAGNOSIS — I6523 Occlusion and stenosis of bilateral carotid arteries: Secondary | ICD-10-CM | POA: Diagnosis not present

## 2018-01-10 DIAGNOSIS — D649 Anemia, unspecified: Secondary | ICD-10-CM | POA: Diagnosis not present

## 2018-01-10 DIAGNOSIS — Z23 Encounter for immunization: Secondary | ICD-10-CM | POA: Diagnosis not present

## 2018-01-10 DIAGNOSIS — R7301 Impaired fasting glucose: Secondary | ICD-10-CM | POA: Diagnosis not present

## 2018-01-10 DIAGNOSIS — M810 Age-related osteoporosis without current pathological fracture: Secondary | ICD-10-CM | POA: Diagnosis not present

## 2018-01-10 DIAGNOSIS — N184 Chronic kidney disease, stage 4 (severe): Secondary | ICD-10-CM | POA: Diagnosis not present

## 2018-02-05 MED FILL — FORTEO 600 MCG/2.4 ML PEN I: 600 | 28 days supply | Qty: 2 | Fill #6

## 2018-03-22 MED FILL — FORTEO 600 MCG/2.4 ML PEN I: 600 | 28 days supply | Qty: 2 | Fill #7

## 2018-04-24 MED FILL — FORTEO 600 MCG/2.4 ML PEN I: 600 | 28 days supply | Qty: 2 | Fill #8

## 2018-05-31 MED FILL — FORTEO 600 MCG/2.4 ML PEN I: 600 | 28 days supply | Qty: 2 | Fill #9

## 2018-07-20 MED FILL — FORTEO 600 MCG/2.4 ML PEN I: 600 | 28 days supply | Qty: 2 | Fill #0

## 2018-08-15 DIAGNOSIS — E7849 Other hyperlipidemia: Secondary | ICD-10-CM | POA: Diagnosis not present

## 2018-08-15 DIAGNOSIS — M810 Age-related osteoporosis without current pathological fracture: Secondary | ICD-10-CM | POA: Diagnosis not present

## 2018-08-15 DIAGNOSIS — I10 Essential (primary) hypertension: Secondary | ICD-10-CM | POA: Diagnosis not present

## 2018-08-15 DIAGNOSIS — R7301 Impaired fasting glucose: Secondary | ICD-10-CM | POA: Diagnosis not present

## 2018-08-22 DIAGNOSIS — I10 Essential (primary) hypertension: Secondary | ICD-10-CM | POA: Diagnosis not present

## 2018-08-22 DIAGNOSIS — R82998 Other abnormal findings in urine: Secondary | ICD-10-CM | POA: Diagnosis not present

## 2018-09-10 DIAGNOSIS — D649 Anemia, unspecified: Secondary | ICD-10-CM | POA: Diagnosis not present

## 2018-09-10 DIAGNOSIS — I6523 Occlusion and stenosis of bilateral carotid arteries: Secondary | ICD-10-CM | POA: Diagnosis not present

## 2018-09-10 DIAGNOSIS — I10 Essential (primary) hypertension: Secondary | ICD-10-CM | POA: Diagnosis not present

## 2018-09-10 DIAGNOSIS — Z1331 Encounter for screening for depression: Secondary | ICD-10-CM | POA: Diagnosis not present

## 2018-09-10 DIAGNOSIS — Z0000 Encounter for general adult medical examination without abnormal findings: Secondary | ICD-10-CM | POA: Diagnosis not present

## 2018-09-10 DIAGNOSIS — Z1339 Encounter for screening examination for other mental health and behavioral disorders: Secondary | ICD-10-CM | POA: Diagnosis not present

## 2018-09-10 DIAGNOSIS — I129 Hypertensive chronic kidney disease with stage 1 through stage 4 chronic kidney disease, or unspecified chronic kidney disease: Secondary | ICD-10-CM | POA: Diagnosis not present

## 2018-09-10 DIAGNOSIS — E785 Hyperlipidemia, unspecified: Secondary | ICD-10-CM | POA: Diagnosis not present

## 2018-09-10 DIAGNOSIS — N184 Chronic kidney disease, stage 4 (severe): Secondary | ICD-10-CM | POA: Diagnosis not present

## 2018-09-10 DIAGNOSIS — M810 Age-related osteoporosis without current pathological fracture: Secondary | ICD-10-CM | POA: Diagnosis not present

## 2018-09-10 DIAGNOSIS — R7301 Impaired fasting glucose: Secondary | ICD-10-CM | POA: Diagnosis not present

## 2018-09-18 MED FILL — FORTEO 600 MCG/2.4 ML PEN I: 600 | 28 days supply | Qty: 2 | Fill #1

## 2018-11-05 MED FILL — FORTEO 600 MCG/2.4 ML PEN I: 600 | 28 days supply | Qty: 2 | Fill #2

## 2019-01-03 MED FILL — FORTEO 600 MCG/2.4 ML PEN I: 600 | 28 days supply | Qty: 2 | Fill #3

## 2019-01-04 DIAGNOSIS — Z23 Encounter for immunization: Secondary | ICD-10-CM | POA: Diagnosis not present

## 2019-01-04 DIAGNOSIS — I10 Essential (primary) hypertension: Secondary | ICD-10-CM | POA: Diagnosis not present

## 2019-01-04 DIAGNOSIS — M810 Age-related osteoporosis without current pathological fracture: Secondary | ICD-10-CM | POA: Diagnosis not present

## 2019-09-03 DIAGNOSIS — M810 Age-related osteoporosis without current pathological fracture: Secondary | ICD-10-CM | POA: Diagnosis not present

## 2019-09-03 DIAGNOSIS — R7301 Impaired fasting glucose: Secondary | ICD-10-CM | POA: Diagnosis not present

## 2019-09-03 DIAGNOSIS — E7849 Other hyperlipidemia: Secondary | ICD-10-CM | POA: Diagnosis not present

## 2019-09-25 DIAGNOSIS — R7301 Impaired fasting glucose: Secondary | ICD-10-CM | POA: Diagnosis not present

## 2019-09-25 DIAGNOSIS — I6523 Occlusion and stenosis of bilateral carotid arteries: Secondary | ICD-10-CM | POA: Diagnosis not present

## 2019-09-25 DIAGNOSIS — I129 Hypertensive chronic kidney disease with stage 1 through stage 4 chronic kidney disease, or unspecified chronic kidney disease: Secondary | ICD-10-CM | POA: Diagnosis not present

## 2019-09-25 DIAGNOSIS — Z1331 Encounter for screening for depression: Secondary | ICD-10-CM | POA: Diagnosis not present

## 2019-09-25 DIAGNOSIS — M810 Age-related osteoporosis without current pathological fracture: Secondary | ICD-10-CM | POA: Diagnosis not present

## 2019-09-25 DIAGNOSIS — Z0000 Encounter for general adult medical examination without abnormal findings: Secondary | ICD-10-CM | POA: Diagnosis not present

## 2019-09-25 DIAGNOSIS — D649 Anemia, unspecified: Secondary | ICD-10-CM | POA: Diagnosis not present

## 2019-09-25 DIAGNOSIS — N184 Chronic kidney disease, stage 4 (severe): Secondary | ICD-10-CM | POA: Diagnosis not present

## 2019-09-25 DIAGNOSIS — E785 Hyperlipidemia, unspecified: Secondary | ICD-10-CM | POA: Diagnosis not present

## 2019-10-02 ENCOUNTER — Ambulatory Visit (HOSPITAL_COMMUNITY)
Admission: RE | Admit: 2019-10-02 | Discharge: 2019-10-02 | Disposition: A | Discharge status: Home / Self Care | Payer: PPO | Source: Ambulatory Visit | Attending: Surgery | Admitting: Surgery

## 2019-10-02 ENCOUNTER — Other Ambulatory Visit (HOSPITAL_COMMUNITY): Payer: Self-pay | Admitting: Internal Medicine

## 2019-10-02 ENCOUNTER — Other Ambulatory Visit: Payer: Self-pay

## 2019-10-02 DIAGNOSIS — I779 Disorder of arteries and arterioles, unspecified: Secondary | ICD-10-CM | POA: Insufficient documentation

## 2019-10-08 ENCOUNTER — Other Ambulatory Visit (HOSPITAL_COMMUNITY): Payer: Self-pay

## 2019-10-09 ENCOUNTER — Ambulatory Visit (HOSPITAL_COMMUNITY)
Admission: RE | Admit: 2019-10-09 | Discharge: 2019-10-09 | Disposition: A | Discharge status: Home / Self Care | Payer: PPO | Source: Ambulatory Visit | Attending: Internal Medicine | Admitting: Internal Medicine

## 2019-10-09 ENCOUNTER — Other Ambulatory Visit: Payer: Self-pay

## 2019-10-09 DIAGNOSIS — M810 Age-related osteoporosis without current pathological fracture: Secondary | ICD-10-CM | POA: Diagnosis not present

## 2019-10-09 MED ORDER — DENOSUMAB 60 MG/ML ~~LOC~~ SOSY
PREFILLED_SYRINGE | SUBCUTANEOUS | Status: AC
  Administered 2019-10-09: 14:00:00 60 mg via SUBCUTANEOUS
  Filled 2019-10-09: qty 1

## 2019-10-09 MED ORDER — DENOSUMAB 60 MG/ML ~~LOC~~ SOSY: 60.0000 mg | PREFILLED_SYRINGE | Freq: Once | SUBCUTANEOUS | Status: AC

## 2019-10-09 NOTE — Discharge Instructions (Signed)
Denosumab injection What is this medicine? DENOSUMAB (den oh sue mab) slows bone breakdown. Prolia is used to treat osteoporosis in women after menopause and in men, and in people who are taking corticosteroids for 6 months or more. Xgeva is used to treat a high calcium level due to cancer and to prevent bone fractures and other bone problems caused by multiple myeloma or cancer bone metastases. Xgeva is also used to treat giant cell tumor of the bone. This medicine may be used for other purposes; ask your health care provider or pharmacist if you have questions. COMMON BRAND NAME(S): Prolia, XGEVA What should I tell my health care provider before I take this medicine? They need to know if you have any of these conditions:  dental disease  having surgery or tooth extraction  infection  kidney disease  low levels of calcium or Vitamin D in the blood  malnutrition  on hemodialysis  skin conditions or sensitivity  thyroid or parathyroid disease  an unusual reaction to denosumab, other medicines, foods, dyes, or preservatives  pregnant or trying to get pregnant  breast-feeding How should I use this medicine? This medicine is for injection under the skin. It is given by a health care professional in a hospital or clinic setting. A special MedGuide will be given to you before each treatment. Be sure to read this information carefully each time. For Prolia, talk to your pediatrician regarding the use of this medicine in children. Special care may be needed. For Xgeva, talk to your pediatrician regarding the use of this medicine in children. While this drug may be prescribed for children as young as 13 years for selected conditions, precautions do apply. Overdosage: If you think you have taken too much of this medicine contact a poison control center or emergency room at once. NOTE: This medicine is only for you. Do not share this medicine with others. What if I miss a dose? It is  important not to miss your dose. Call your doctor or health care professional if you are unable to keep an appointment. What may interact with this medicine? Do not take this medicine with any of the following medications:  other medicines containing denosumab This medicine may also interact with the following medications:  medicines that lower your chance of fighting infection  steroid medicines like prednisone or cortisone This list may not describe all possible interactions. Give your health care provider a list of all the medicines, herbs, non-prescription drugs, or dietary supplements you use. Also tell them if you smoke, drink alcohol, or use illegal drugs. Some items may interact with your medicine. What should I watch for while using this medicine? Visit your doctor or health care professional for regular checks on your progress. Your doctor or health care professional may order blood tests and other tests to see how you are doing. Call your doctor or health care professional for advice if you get a fever, chills or sore throat, or other symptoms of a cold or flu. Do not treat yourself. This drug may decrease your body's ability to fight infection. Try to avoid being around people who are sick. You should make sure you get enough calcium and vitamin D while you are taking this medicine, unless your doctor tells you not to. Discuss the foods you eat and the vitamins you take with your health care professional. See your dentist regularly. Brush and floss your teeth as directed. Before you have any dental work done, tell your dentist you are   receiving this medicine. Do not become pregnant while taking this medicine or for 5 months after stopping it. Talk with your doctor or health care professional about your birth control options while taking this medicine. Women should inform their doctor if they wish to become pregnant or think they might be pregnant. There is a potential for serious side  effects to an unborn child. Talk to your health care professional or pharmacist for more information. What side effects may I notice from receiving this medicine? Side effects that you should report to your doctor or health care professional as soon as possible:  allergic reactions like skin rash, itching or hives, swelling of the face, lips, or tongue  bone pain  breathing problems  dizziness  jaw pain, especially after dental work  redness, blistering, peeling of the skin  signs and symptoms of infection like fever or chills; cough; sore throat; pain or trouble passing urine  signs of low calcium like fast heartbeat, muscle cramps or muscle pain; pain, tingling, numbness in the hands or feet; seizures  unusual bleeding or bruising  unusually weak or tired Side effects that usually do not require medical attention (report to your doctor or health care professional if they continue or are bothersome):  constipation  diarrhea  headache  joint pain  loss of appetite  muscle pain  runny nose  tiredness  upset stomach This list may not describe all possible side effects. Call your doctor for medical advice about side effects. You may report side effects to FDA at 1-800-FDA-1088. Where should I keep my medicine? This medicine is only given in a clinic, doctor's office, or other health care setting and will not be stored at home. NOTE: This sheet is a summary. It may not cover all possible information. If you have questions about this medicine, talk to your doctor, pharmacist, or health care provider.  2020 Elsevier/Gold Standard (2017-07-14 16:10:44)

## 2019-12-05 ENCOUNTER — Other Ambulatory Visit (HOSPITAL_COMMUNITY): Payer: Self-pay | Admitting: Internal Medicine

## 2019-12-05 MED FILL — AMLODIPINE BESYLATE 5 MG TA: 5 | 30 days supply | Qty: 30 | Fill #0

## 2020-01-14 MED FILL — AMLODIPINE BESYLATE 5 MG TA: 5 | 30 days supply | Qty: 30 | Fill #1

## 2020-05-25 DIAGNOSIS — Z20828 Contact with and (suspected) exposure to other viral communicable diseases: Secondary | ICD-10-CM | POA: Diagnosis not present

## 2020-05-25 DIAGNOSIS — Z1159 Encounter for screening for other viral diseases: Secondary | ICD-10-CM | POA: Diagnosis not present

## 2020-06-01 DIAGNOSIS — Z20828 Contact with and (suspected) exposure to other viral communicable diseases: Secondary | ICD-10-CM | POA: Diagnosis not present

## 2020-06-01 DIAGNOSIS — Z1159 Encounter for screening for other viral diseases: Secondary | ICD-10-CM | POA: Diagnosis not present

## 2020-06-08 DIAGNOSIS — Z20828 Contact with and (suspected) exposure to other viral communicable diseases: Secondary | ICD-10-CM | POA: Diagnosis not present

## 2020-06-08 DIAGNOSIS — Z1159 Encounter for screening for other viral diseases: Secondary | ICD-10-CM | POA: Diagnosis not present

## 2020-06-15 DIAGNOSIS — Z20828 Contact with and (suspected) exposure to other viral communicable diseases: Secondary | ICD-10-CM | POA: Diagnosis not present

## 2020-06-15 DIAGNOSIS — Z1159 Encounter for screening for other viral diseases: Secondary | ICD-10-CM | POA: Diagnosis not present

## 2020-06-22 DIAGNOSIS — Z1159 Encounter for screening for other viral diseases: Secondary | ICD-10-CM | POA: Diagnosis not present

## 2020-06-22 DIAGNOSIS — Z20828 Contact with and (suspected) exposure to other viral communicable diseases: Secondary | ICD-10-CM | POA: Diagnosis not present

## 2020-06-29 DIAGNOSIS — Z1159 Encounter for screening for other viral diseases: Secondary | ICD-10-CM | POA: Diagnosis not present

## 2020-06-29 DIAGNOSIS — Z20828 Contact with and (suspected) exposure to other viral communicable diseases: Secondary | ICD-10-CM | POA: Diagnosis not present

## 2020-07-06 DIAGNOSIS — Z1159 Encounter for screening for other viral diseases: Secondary | ICD-10-CM | POA: Diagnosis not present

## 2020-07-06 DIAGNOSIS — Z20828 Contact with and (suspected) exposure to other viral communicable diseases: Secondary | ICD-10-CM | POA: Diagnosis not present

## 2020-07-13 DIAGNOSIS — Z20828 Contact with and (suspected) exposure to other viral communicable diseases: Secondary | ICD-10-CM | POA: Diagnosis not present

## 2020-07-13 DIAGNOSIS — Z1159 Encounter for screening for other viral diseases: Secondary | ICD-10-CM | POA: Diagnosis not present

## 2020-07-20 DIAGNOSIS — Z1159 Encounter for screening for other viral diseases: Secondary | ICD-10-CM | POA: Diagnosis not present

## 2020-07-20 DIAGNOSIS — Z20828 Contact with and (suspected) exposure to other viral communicable diseases: Secondary | ICD-10-CM | POA: Diagnosis not present

## 2020-07-27 DIAGNOSIS — Z1159 Encounter for screening for other viral diseases: Secondary | ICD-10-CM | POA: Diagnosis not present

## 2020-07-27 DIAGNOSIS — Z20828 Contact with and (suspected) exposure to other viral communicable diseases: Secondary | ICD-10-CM | POA: Diagnosis not present

## 2020-08-03 DIAGNOSIS — Z20828 Contact with and (suspected) exposure to other viral communicable diseases: Secondary | ICD-10-CM | POA: Diagnosis not present

## 2020-08-03 DIAGNOSIS — Z1159 Encounter for screening for other viral diseases: Secondary | ICD-10-CM | POA: Diagnosis not present

## 2020-08-10 DIAGNOSIS — Z1159 Encounter for screening for other viral diseases: Secondary | ICD-10-CM | POA: Diagnosis not present

## 2020-08-10 DIAGNOSIS — Z20828 Contact with and (suspected) exposure to other viral communicable diseases: Secondary | ICD-10-CM | POA: Diagnosis not present

## 2020-08-17 DIAGNOSIS — Z20828 Contact with and (suspected) exposure to other viral communicable diseases: Secondary | ICD-10-CM | POA: Diagnosis not present

## 2020-08-17 DIAGNOSIS — Z1159 Encounter for screening for other viral diseases: Secondary | ICD-10-CM | POA: Diagnosis not present

## 2020-08-24 DIAGNOSIS — Z1159 Encounter for screening for other viral diseases: Secondary | ICD-10-CM | POA: Diagnosis not present

## 2020-08-24 DIAGNOSIS — Z20828 Contact with and (suspected) exposure to other viral communicable diseases: Secondary | ICD-10-CM | POA: Diagnosis not present

## 2020-08-31 DIAGNOSIS — Z20828 Contact with and (suspected) exposure to other viral communicable diseases: Secondary | ICD-10-CM | POA: Diagnosis not present

## 2020-08-31 DIAGNOSIS — Z1159 Encounter for screening for other viral diseases: Secondary | ICD-10-CM | POA: Diagnosis not present

## 2020-09-07 DIAGNOSIS — Z1159 Encounter for screening for other viral diseases: Secondary | ICD-10-CM | POA: Diagnosis not present

## 2020-09-07 DIAGNOSIS — Z20828 Contact with and (suspected) exposure to other viral communicable diseases: Secondary | ICD-10-CM | POA: Diagnosis not present

## 2020-09-14 DIAGNOSIS — Z20828 Contact with and (suspected) exposure to other viral communicable diseases: Secondary | ICD-10-CM | POA: Diagnosis not present

## 2020-09-14 DIAGNOSIS — Z1159 Encounter for screening for other viral diseases: Secondary | ICD-10-CM | POA: Diagnosis not present

## 2020-09-21 DIAGNOSIS — Z20828 Contact with and (suspected) exposure to other viral communicable diseases: Secondary | ICD-10-CM | POA: Diagnosis not present

## 2020-09-21 DIAGNOSIS — Z1159 Encounter for screening for other viral diseases: Secondary | ICD-10-CM | POA: Diagnosis not present

## 2020-09-28 DIAGNOSIS — Z1159 Encounter for screening for other viral diseases: Secondary | ICD-10-CM | POA: Diagnosis not present

## 2020-09-28 DIAGNOSIS — Z20828 Contact with and (suspected) exposure to other viral communicable diseases: Secondary | ICD-10-CM | POA: Diagnosis not present

## 2020-10-05 DIAGNOSIS — Z20828 Contact with and (suspected) exposure to other viral communicable diseases: Secondary | ICD-10-CM | POA: Diagnosis not present

## 2020-10-05 DIAGNOSIS — Z1159 Encounter for screening for other viral diseases: Secondary | ICD-10-CM | POA: Diagnosis not present

## 2020-10-12 DIAGNOSIS — Z1159 Encounter for screening for other viral diseases: Secondary | ICD-10-CM | POA: Diagnosis not present

## 2020-10-12 DIAGNOSIS — Z20828 Contact with and (suspected) exposure to other viral communicable diseases: Secondary | ICD-10-CM | POA: Diagnosis not present

## 2020-10-13 DIAGNOSIS — E785 Hyperlipidemia, unspecified: Secondary | ICD-10-CM | POA: Diagnosis not present

## 2020-10-13 DIAGNOSIS — N184 Chronic kidney disease, stage 4 (severe): Secondary | ICD-10-CM | POA: Diagnosis not present

## 2020-10-13 DIAGNOSIS — I129 Hypertensive chronic kidney disease with stage 1 through stage 4 chronic kidney disease, or unspecified chronic kidney disease: Secondary | ICD-10-CM | POA: Diagnosis not present

## 2020-10-13 DIAGNOSIS — R7301 Impaired fasting glucose: Secondary | ICD-10-CM | POA: Diagnosis not present

## 2020-10-13 DIAGNOSIS — M810 Age-related osteoporosis without current pathological fracture: Secondary | ICD-10-CM | POA: Diagnosis not present

## 2020-10-16 DIAGNOSIS — R82998 Other abnormal findings in urine: Secondary | ICD-10-CM | POA: Diagnosis not present

## 2020-10-16 DIAGNOSIS — I10 Essential (primary) hypertension: Secondary | ICD-10-CM | POA: Diagnosis not present

## 2020-10-16 DIAGNOSIS — D649 Anemia, unspecified: Secondary | ICD-10-CM | POA: Diagnosis not present

## 2020-10-19 DIAGNOSIS — Z20828 Contact with and (suspected) exposure to other viral communicable diseases: Secondary | ICD-10-CM | POA: Diagnosis not present

## 2020-10-19 DIAGNOSIS — Z1159 Encounter for screening for other viral diseases: Secondary | ICD-10-CM | POA: Diagnosis not present

## 2020-10-21 DIAGNOSIS — Z1339 Encounter for screening examination for other mental health and behavioral disorders: Secondary | ICD-10-CM | POA: Diagnosis not present

## 2020-10-21 DIAGNOSIS — D649 Anemia, unspecified: Secondary | ICD-10-CM | POA: Diagnosis not present

## 2020-10-21 DIAGNOSIS — I129 Hypertensive chronic kidney disease with stage 1 through stage 4 chronic kidney disease, or unspecified chronic kidney disease: Secondary | ICD-10-CM | POA: Diagnosis not present

## 2020-10-21 DIAGNOSIS — R7301 Impaired fasting glucose: Secondary | ICD-10-CM | POA: Diagnosis not present

## 2020-10-21 DIAGNOSIS — E785 Hyperlipidemia, unspecified: Secondary | ICD-10-CM | POA: Diagnosis not present

## 2020-10-21 DIAGNOSIS — Z1331 Encounter for screening for depression: Secondary | ICD-10-CM | POA: Diagnosis not present

## 2020-10-21 DIAGNOSIS — M810 Age-related osteoporosis without current pathological fracture: Secondary | ICD-10-CM | POA: Diagnosis not present

## 2020-10-21 DIAGNOSIS — N184 Chronic kidney disease, stage 4 (severe): Secondary | ICD-10-CM | POA: Diagnosis not present

## 2020-10-21 DIAGNOSIS — Z7189 Other specified counseling: Secondary | ICD-10-CM | POA: Diagnosis not present

## 2020-10-21 DIAGNOSIS — Z0000 Encounter for general adult medical examination without abnormal findings: Secondary | ICD-10-CM | POA: Diagnosis not present

## 2020-10-21 DIAGNOSIS — I6523 Occlusion and stenosis of bilateral carotid arteries: Secondary | ICD-10-CM | POA: Diagnosis not present

## 2021-01-26 DIAGNOSIS — Z23 Encounter for immunization: Secondary | ICD-10-CM | POA: Diagnosis not present

## 2021-01-26 DIAGNOSIS — M810 Age-related osteoporosis without current pathological fracture: Secondary | ICD-10-CM | POA: Diagnosis not present

## 2021-03-03 DIAGNOSIS — Z20828 Contact with and (suspected) exposure to other viral communicable diseases: Secondary | ICD-10-CM | POA: Diagnosis not present

## 2021-03-03 DIAGNOSIS — Z1159 Encounter for screening for other viral diseases: Secondary | ICD-10-CM | POA: Diagnosis not present

## 2021-03-05 ENCOUNTER — Emergency Department (HOSPITAL_COMMUNITY): Payer: PPO

## 2021-03-05 ENCOUNTER — Emergency Department (HOSPITAL_COMMUNITY): Payer: PPO | Admitting: Anesthesiology

## 2021-03-05 ENCOUNTER — Encounter (HOSPITAL_COMMUNITY): Payer: Self-pay | Admitting: *Deleted

## 2021-03-05 ENCOUNTER — Inpatient Hospital Stay (HOSPITAL_COMMUNITY)
Admission: EM | Admit: 2021-03-05 | Discharge: 2021-03-30 | DRG: 329 | Disposition: A | Discharge status: Skilled Nursing Facility | Payer: PPO | Source: Skilled Nursing Facility | Attending: General Surgery | Admitting: General Surgery

## 2021-03-05 ENCOUNTER — Encounter (HOSPITAL_COMMUNITY)
Admission: EM | Disposition: A | Discharge status: Skilled Nursing Facility | Payer: Self-pay | Source: Skilled Nursing Facility

## 2021-03-05 DIAGNOSIS — R34 Anuria and oliguria: Secondary | ICD-10-CM | POA: Diagnosis not present

## 2021-03-05 DIAGNOSIS — K3189 Other diseases of stomach and duodenum: Secondary | ICD-10-CM | POA: Diagnosis not present

## 2021-03-05 DIAGNOSIS — E440 Moderate protein-calorie malnutrition: Secondary | ICD-10-CM | POA: Diagnosis present

## 2021-03-05 DIAGNOSIS — J9600 Acute respiratory failure, unspecified whether with hypoxia or hypercapnia: Secondary | ICD-10-CM | POA: Diagnosis present

## 2021-03-05 DIAGNOSIS — T8140XA Infection following a procedure, unspecified, initial encounter: Secondary | ICD-10-CM

## 2021-03-05 DIAGNOSIS — R451 Restlessness and agitation: Secondary | ICD-10-CM | POA: Diagnosis not present

## 2021-03-05 DIAGNOSIS — I129 Hypertensive chronic kidney disease with stage 1 through stage 4 chronic kidney disease, or unspecified chronic kidney disease: Secondary | ICD-10-CM | POA: Diagnosis present

## 2021-03-05 DIAGNOSIS — Z981 Arthrodesis status: Secondary | ICD-10-CM | POA: Diagnosis not present

## 2021-03-05 DIAGNOSIS — Z0189 Encounter for other specified special examinations: Secondary | ICD-10-CM

## 2021-03-05 DIAGNOSIS — R739 Hyperglycemia, unspecified: Secondary | ICD-10-CM | POA: Diagnosis not present

## 2021-03-05 DIAGNOSIS — R198 Other specified symptoms and signs involving the digestive system and abdomen: Secondary | ICD-10-CM | POA: Diagnosis present

## 2021-03-05 DIAGNOSIS — M25531 Pain in right wrist: Secondary | ICD-10-CM | POA: Diagnosis not present

## 2021-03-05 DIAGNOSIS — J90 Pleural effusion, not elsewhere classified: Secondary | ICD-10-CM | POA: Diagnosis not present

## 2021-03-05 DIAGNOSIS — K261 Acute duodenal ulcer with perforation: Secondary | ICD-10-CM | POA: Diagnosis not present

## 2021-03-05 DIAGNOSIS — E871 Hypo-osmolality and hyponatremia: Secondary | ICD-10-CM | POA: Diagnosis not present

## 2021-03-05 DIAGNOSIS — D631 Anemia in chronic kidney disease: Secondary | ICD-10-CM | POA: Diagnosis present

## 2021-03-05 DIAGNOSIS — I10 Essential (primary) hypertension: Secondary | ICD-10-CM | POA: Diagnosis present

## 2021-03-05 DIAGNOSIS — M79643 Pain in unspecified hand: Secondary | ICD-10-CM

## 2021-03-05 DIAGNOSIS — R109 Unspecified abdominal pain: Secondary | ICD-10-CM | POA: Diagnosis not present

## 2021-03-05 DIAGNOSIS — M79641 Pain in right hand: Secondary | ICD-10-CM | POA: Diagnosis not present

## 2021-03-05 DIAGNOSIS — Z9889 Other specified postprocedural states: Secondary | ICD-10-CM | POA: Diagnosis not present

## 2021-03-05 DIAGNOSIS — I6529 Occlusion and stenosis of unspecified carotid artery: Secondary | ICD-10-CM | POA: Diagnosis present

## 2021-03-05 DIAGNOSIS — R54 Age-related physical debility: Secondary | ICD-10-CM | POA: Diagnosis present

## 2021-03-05 DIAGNOSIS — R339 Retention of urine, unspecified: Secondary | ICD-10-CM | POA: Diagnosis not present

## 2021-03-05 DIAGNOSIS — Z20822 Contact with and (suspected) exposure to covid-19: Secondary | ICD-10-CM | POA: Diagnosis present

## 2021-03-05 DIAGNOSIS — D62 Acute posthemorrhagic anemia: Secondary | ICD-10-CM | POA: Diagnosis not present

## 2021-03-05 DIAGNOSIS — N184 Chronic kidney disease, stage 4 (severe): Secondary | ICD-10-CM | POA: Diagnosis present

## 2021-03-05 DIAGNOSIS — R079 Chest pain, unspecified: Secondary | ICD-10-CM | POA: Diagnosis not present

## 2021-03-05 DIAGNOSIS — R062 Wheezing: Secondary | ICD-10-CM

## 2021-03-05 DIAGNOSIS — G4700 Insomnia, unspecified: Secondary | ICD-10-CM | POA: Diagnosis not present

## 2021-03-05 DIAGNOSIS — R531 Weakness: Secondary | ICD-10-CM | POA: Diagnosis not present

## 2021-03-05 DIAGNOSIS — G9341 Metabolic encephalopathy: Secondary | ICD-10-CM | POA: Diagnosis not present

## 2021-03-05 DIAGNOSIS — I959 Hypotension, unspecified: Secondary | ICD-10-CM | POA: Diagnosis not present

## 2021-03-05 DIAGNOSIS — Z4682 Encounter for fitting and adjustment of non-vascular catheter: Secondary | ICD-10-CM | POA: Diagnosis not present

## 2021-03-05 DIAGNOSIS — R112 Nausea with vomiting, unspecified: Secondary | ICD-10-CM | POA: Diagnosis not present

## 2021-03-05 DIAGNOSIS — J9811 Atelectasis: Secondary | ICD-10-CM | POA: Diagnosis not present

## 2021-03-05 DIAGNOSIS — R64 Cachexia: Secondary | ICD-10-CM | POA: Diagnosis present

## 2021-03-05 DIAGNOSIS — J9690 Respiratory failure, unspecified, unspecified whether with hypoxia or hypercapnia: Secondary | ICD-10-CM | POA: Diagnosis not present

## 2021-03-05 DIAGNOSIS — Z8249 Family history of ischemic heart disease and other diseases of the circulatory system: Secondary | ICD-10-CM | POA: Diagnosis not present

## 2021-03-05 DIAGNOSIS — K6389 Other specified diseases of intestine: Secondary | ICD-10-CM | POA: Diagnosis not present

## 2021-03-05 DIAGNOSIS — K650 Generalized (acute) peritonitis: Secondary | ICD-10-CM | POA: Diagnosis present

## 2021-03-05 DIAGNOSIS — I739 Peripheral vascular disease, unspecified: Secondary | ICD-10-CM | POA: Diagnosis not present

## 2021-03-05 DIAGNOSIS — E872 Acidosis, unspecified: Secondary | ICD-10-CM | POA: Diagnosis not present

## 2021-03-05 DIAGNOSIS — R7401 Elevation of levels of liver transaminase levels: Secondary | ICD-10-CM | POA: Diagnosis not present

## 2021-03-05 DIAGNOSIS — N189 Chronic kidney disease, unspecified: Secondary | ICD-10-CM | POA: Diagnosis not present

## 2021-03-05 DIAGNOSIS — F05 Delirium due to known physiological condition: Secondary | ICD-10-CM | POA: Diagnosis not present

## 2021-03-05 DIAGNOSIS — K828 Other specified diseases of gallbladder: Secondary | ICD-10-CM | POA: Diagnosis not present

## 2021-03-05 DIAGNOSIS — R918 Other nonspecific abnormal finding of lung field: Secondary | ICD-10-CM | POA: Diagnosis not present

## 2021-03-05 DIAGNOSIS — R0602 Shortness of breath: Secondary | ICD-10-CM | POA: Diagnosis not present

## 2021-03-05 DIAGNOSIS — K265 Chronic or unspecified duodenal ulcer with perforation: Secondary | ICD-10-CM | POA: Diagnosis present

## 2021-03-05 DIAGNOSIS — N179 Acute kidney failure, unspecified: Secondary | ICD-10-CM | POA: Diagnosis present

## 2021-03-05 DIAGNOSIS — K269 Duodenal ulcer, unspecified as acute or chronic, without hemorrhage or perforation: Secondary | ICD-10-CM | POA: Diagnosis not present

## 2021-03-05 DIAGNOSIS — L89151 Pressure ulcer of sacral region, stage 1: Secondary | ICD-10-CM | POA: Diagnosis present

## 2021-03-05 DIAGNOSIS — R188 Other ascites: Secondary | ICD-10-CM | POA: Diagnosis not present

## 2021-03-05 DIAGNOSIS — Z48815 Encounter for surgical aftercare following surgery on the digestive system: Secondary | ICD-10-CM | POA: Diagnosis not present

## 2021-03-05 DIAGNOSIS — J9601 Acute respiratory failure with hypoxia: Secondary | ICD-10-CM | POA: Diagnosis present

## 2021-03-05 DIAGNOSIS — E569 Vitamin deficiency, unspecified: Secondary | ICD-10-CM | POA: Diagnosis not present

## 2021-03-05 DIAGNOSIS — E785 Hyperlipidemia, unspecified: Secondary | ICD-10-CM | POA: Diagnosis not present

## 2021-03-05 DIAGNOSIS — R110 Nausea: Secondary | ICD-10-CM | POA: Diagnosis not present

## 2021-03-05 DIAGNOSIS — K8020 Calculus of gallbladder without cholecystitis without obstruction: Secondary | ICD-10-CM | POA: Diagnosis not present

## 2021-03-05 DIAGNOSIS — I517 Cardiomegaly: Secondary | ICD-10-CM | POA: Diagnosis not present

## 2021-03-05 DIAGNOSIS — R338 Other retention of urine: Secondary | ICD-10-CM | POA: Diagnosis not present

## 2021-03-05 DIAGNOSIS — R1084 Generalized abdominal pain: Secondary | ICD-10-CM | POA: Diagnosis not present

## 2021-03-05 DIAGNOSIS — E079 Disorder of thyroid, unspecified: Secondary | ICD-10-CM | POA: Diagnosis present

## 2021-03-05 DIAGNOSIS — K5939 Other megacolon: Secondary | ICD-10-CM | POA: Diagnosis not present

## 2021-03-05 DIAGNOSIS — I319 Disease of pericardium, unspecified: Secondary | ICD-10-CM | POA: Diagnosis not present

## 2021-03-05 DIAGNOSIS — Z6820 Body mass index (BMI) 20.0-20.9, adult: Secondary | ICD-10-CM

## 2021-03-05 DIAGNOSIS — Z7982 Long term (current) use of aspirin: Secondary | ICD-10-CM

## 2021-03-05 DIAGNOSIS — Z79899 Other long term (current) drug therapy: Secondary | ICD-10-CM

## 2021-03-05 DIAGNOSIS — Z823 Family history of stroke: Secondary | ICD-10-CM

## 2021-03-05 HISTORY — PX: LAPAROTOMY: SHX154

## 2021-03-05 LAB — CBC WITH DIFFERENTIAL/PLATELET
Abs Immature Granulocytes: 0.05 10*3/uL (ref 0.00–0.07)
Basophils Absolute: 0 10*3/uL (ref 0.0–0.1)
Basophils Relative: 0 %
Eosinophils Absolute: 0 10*3/uL (ref 0.0–0.5)
Eosinophils Relative: 0 %
HCT: 28.9 % — ABNORMAL LOW (ref 36.0–46.0)
Hemoglobin: 9.6 g/dL — ABNORMAL LOW (ref 12.0–15.0)
Immature Granulocytes: 0 %
Lymphocytes Relative: 3 %
Lymphs Abs: 0.5 10*3/uL — ABNORMAL LOW (ref 0.7–4.0)
MCH: 33 pg (ref 26.0–34.0)
MCHC: 33.2 g/dL (ref 30.0–36.0)
MCV: 99.3 fL (ref 80.0–100.0)
Monocytes Absolute: 0.5 10*3/uL (ref 0.1–1.0)
Monocytes Relative: 3 %
Neutro Abs: 15.2 10*3/uL — ABNORMAL HIGH (ref 1.7–7.7)
Neutrophils Relative %: 94 %
Platelets: 338 10*3/uL (ref 150–400)
RBC: 2.91 MIL/uL — ABNORMAL LOW (ref 3.87–5.11)
RDW: 12.5 % (ref 11.5–15.5)
WBC: 16.2 10*3/uL — ABNORMAL HIGH (ref 4.0–10.5)
nRBC: 0 % (ref 0.0–0.2)

## 2021-03-05 LAB — COMPREHENSIVE METABOLIC PANEL
ALT: 50 U/L — ABNORMAL HIGH (ref 0–44)
AST: 46 U/L — ABNORMAL HIGH (ref 15–41)
Albumin: 3 g/dL — ABNORMAL LOW (ref 3.5–5.0)
Alkaline Phosphatase: 65 U/L (ref 38–126)
Anion gap: 5 (ref 5–15)
BUN: 45 mg/dL — ABNORMAL HIGH (ref 8–23)
CO2: 22 mmol/L (ref 22–32)
Calcium: 8.4 mg/dL — ABNORMAL LOW (ref 8.9–10.3)
Chloride: 104 mmol/L (ref 98–111)
Creatinine, Ser: 2.08 mg/dL — ABNORMAL HIGH (ref 0.44–1.00)
GFR, Estimated: 23 mL/min — ABNORMAL LOW (ref >60)
Glucose, Bld: 172 mg/dL — ABNORMAL HIGH (ref 70–99)
Potassium: 4.6 mmol/L (ref 3.5–5.1)
Sodium: 131 mmol/L — ABNORMAL LOW (ref 135–145)
Total Bilirubin: 0.6 mg/dL (ref 0.3–1.2)
Total Protein: 5.8 g/dL — ABNORMAL LOW (ref 6.5–8.1)

## 2021-03-05 LAB — RESP PANEL BY RT-PCR (FLU A&B, COVID) ARPGX2
Influenza A by PCR: NEGATIVE
Influenza B by PCR: NEGATIVE
SARS Coronavirus 2 by RT PCR: NEGATIVE

## 2021-03-05 LAB — I-STAT CHEM 8, ED
BUN: 41 mg/dL — ABNORMAL HIGH (ref 8–23)
Calcium, Ion: 1.21 mmol/L (ref 1.15–1.40)
Chloride: 102 mmol/L (ref 98–111)
Creatinine, Ser: 1.9 mg/dL — ABNORMAL HIGH (ref 0.44–1.00)
Glucose, Bld: 166 mg/dL — ABNORMAL HIGH (ref 70–99)
HCT: 28 % — ABNORMAL LOW (ref 36.0–46.0)
Hemoglobin: 9.5 g/dL — ABNORMAL LOW (ref 12.0–15.0)
Potassium: 4.5 mmol/L (ref 3.5–5.1)
Sodium: 133 mmol/L — ABNORMAL LOW (ref 135–145)
TCO2: 22 mmol/L (ref 22–32)

## 2021-03-05 LAB — LACTIC ACID, PLASMA: Lactic Acid, Venous: 1.1 mmol/L (ref 0.5–1.9)

## 2021-03-05 LAB — LIPASE, BLOOD: Lipase: 108 U/L — ABNORMAL HIGH (ref 11–51)

## 2021-03-05 LAB — PREPARE RBC (CROSSMATCH)

## 2021-03-05 SURGERY — LAPAROTOMY, EXPLORATORY
Anesthesia: General | Site: Abdomen

## 2021-03-05 MED ORDER — ONDANSETRON HCL 4 MG/2ML IJ SOLN
4.0000 mg | Freq: Once | INTRAMUSCULAR | Status: AC
  Administered 2021-03-05: 4 mg via INTRAVENOUS
  Filled 2021-03-05: qty 2

## 2021-03-05 MED ORDER — DEXAMETHASONE SODIUM PHOSPHATE 10 MG/ML IJ SOLN
INTRAMUSCULAR | Status: AC
  Filled 2021-03-05: qty 1

## 2021-03-05 MED ORDER — ALBUMIN HUMAN 5 % IV SOLN: INTRAVENOUS | Status: DC | PRN

## 2021-03-05 MED ORDER — ONDANSETRON HCL 4 MG/2ML IJ SOLN
INTRAMUSCULAR | Status: AC
  Filled 2021-03-05: qty 2

## 2021-03-05 MED ORDER — HEMOSTATIC AGENTS (NO CHARGE) OPTIME
TOPICAL | Status: DC | PRN
  Administered 2021-03-05: 1 via TOPICAL

## 2021-03-05 MED ORDER — SODIUM CHLORIDE 0.9 % IR SOLN
Status: DC | PRN
  Administered 2021-03-05: 5000 mL

## 2021-03-05 MED ORDER — LACTATED RINGERS IV BOLUS
1000.0000 mL | Freq: Once | INTRAVENOUS | Status: AC
  Administered 2021-03-05: 1000 mL via INTRAVENOUS

## 2021-03-05 MED ORDER — SUCCINYLCHOLINE CHLORIDE 200 MG/10ML IV SOSY
PREFILLED_SYRINGE | INTRAVENOUS | Status: AC
  Filled 2021-03-05: qty 10

## 2021-03-05 MED ORDER — HYDROMORPHONE HCL 1 MG/ML IJ SOLN
1.0000 mg | Freq: Once | INTRAMUSCULAR | Status: AC
  Administered 2021-03-05: 1 mg via INTRAVENOUS
  Filled 2021-03-05: qty 1

## 2021-03-05 MED ORDER — PIPERACILLIN-TAZOBACTAM 3.375 G IVPB 30 MIN
3.3750 g | Freq: Once | INTRAVENOUS | Status: AC
  Administered 2021-03-05: 3.375 g via INTRAVENOUS
  Filled 2021-03-05: qty 50

## 2021-03-05 MED ORDER — PHENYLEPHRINE HCL (PRESSORS) 10 MG/ML IV SOLN
INTRAVENOUS | Status: AC
  Filled 2021-03-05: qty 1

## 2021-03-05 MED ORDER — ROCURONIUM BROMIDE 10 MG/ML (PF) SYRINGE
PREFILLED_SYRINGE | INTRAVENOUS | Status: AC
  Filled 2021-03-05: qty 10

## 2021-03-05 MED ORDER — LACTATED RINGERS IV SOLN: INTRAVENOUS | Status: DC | PRN

## 2021-03-05 MED ORDER — FENTANYL CITRATE (PF) 250 MCG/5ML IJ SOLN
INTRAMUSCULAR | Status: AC
  Filled 2021-03-05: qty 5

## 2021-03-05 MED ORDER — SUCCINYLCHOLINE CHLORIDE 200 MG/10ML IV SOSY
PREFILLED_SYRINGE | INTRAVENOUS | Status: DC | PRN
  Administered 2021-03-05: 120 mg via INTRAVENOUS

## 2021-03-05 MED ORDER — PHENYLEPHRINE 40 MCG/ML (10ML) SYRINGE FOR IV PUSH (FOR BLOOD PRESSURE SUPPORT)
PREFILLED_SYRINGE | INTRAVENOUS | Status: AC
  Filled 2021-03-05: qty 10

## 2021-03-05 MED ORDER — ROCURONIUM BROMIDE 10 MG/ML (PF) SYRINGE
PREFILLED_SYRINGE | INTRAVENOUS | Status: DC | PRN
  Administered 2021-03-05: 25 mg via INTRAVENOUS

## 2021-03-05 MED ORDER — HYDROMORPHONE HCL 1 MG/ML IJ SOLN
0.5000 mg | Freq: Once | INTRAMUSCULAR | Status: AC
  Administered 2021-03-05: 0.5 mg via INTRAVENOUS
  Filled 2021-03-05: qty 1

## 2021-03-05 MED ORDER — HYDROMORPHONE HCL 1 MG/ML IJ SOLN
0.5000 mg | Freq: Once | INTRAMUSCULAR | Status: AC
  Administered 2021-03-06: 0.5 mg via INTRAVENOUS
  Filled 2021-03-05: qty 1

## 2021-03-05 MED ORDER — PHENYLEPHRINE HCL-NACL 20-0.9 MG/250ML-% IV SOLN
INTRAVENOUS | Status: DC | PRN
  Administered 2021-03-05: 25 ug/min via INTRAVENOUS

## 2021-03-05 MED ORDER — LIDOCAINE HCL (PF) 2 % IJ SOLN
INTRAMUSCULAR | Status: AC
  Filled 2021-03-05: qty 5

## 2021-03-05 MED ORDER — LIDOCAINE 2% (20 MG/ML) 5 ML SYRINGE
INTRAMUSCULAR | Status: DC | PRN
  Administered 2021-03-05: 60 mg via INTRAVENOUS

## 2021-03-05 MED ORDER — PROPOFOL 10 MG/ML IV BOLUS
INTRAVENOUS | Status: AC
  Filled 2021-03-05: qty 20

## 2021-03-05 MED ORDER — ONDANSETRON HCL 4 MG/2ML IJ SOLN
INTRAMUSCULAR | Status: DC | PRN
  Administered 2021-03-05: 4 mg via INTRAVENOUS

## 2021-03-05 MED ORDER — ALBUMIN HUMAN 5 % IV SOLN
INTRAVENOUS | Status: AC
  Filled 2021-03-05: qty 250

## 2021-03-05 MED ORDER — FENTANYL CITRATE (PF) 100 MCG/2ML IJ SOLN
INTRAMUSCULAR | Status: DC | PRN
  Administered 2021-03-05: 50 ug via INTRAVENOUS
  Administered 2021-03-05 – 2021-03-06 (×2): 25 ug via INTRAVENOUS

## 2021-03-05 MED ORDER — PROPOFOL 10 MG/ML IV BOLUS
INTRAVENOUS | Status: DC | PRN
  Administered 2021-03-05: 80 mg via INTRAVENOUS

## 2021-03-05 SURGICAL SUPPLY — 42 items
APPLICATOR COTTON TIP 6 STRL (MISCELLANEOUS) ×1 IMPLANT
APPLICATOR COTTON TIP 6IN STRL (MISCELLANEOUS) ×2 IMPLANT
BAG COUNTER SPONGE SURGICOUNT (BAG) IMPLANT
BLADE EXTENDED COATED 6.5IN (ELECTRODE) IMPLANT
BLADE HEX COATED 2.75 (ELECTRODE) ×2 IMPLANT
COVER MAYO STAND STRL (DRAPES) IMPLANT
DRAIN CHANNEL 19F RND (DRAIN) ×1 IMPLANT
DRAPE LAPAROSCOPIC ABDOMINAL (DRAPES) ×2 IMPLANT
DRAPE WARM FLUID 44X44 (DRAPES) IMPLANT
DRSG PAD ABDOMINAL 8X10 ST (GAUZE/BANDAGES/DRESSINGS) ×1 IMPLANT
ELECT REM PT RETURN 15FT ADLT (MISCELLANEOUS) ×2 IMPLANT
EVACUATOR DRAINAGE 10X20 100CC (DRAIN) IMPLANT
EVACUATOR SILICONE 100CC (DRAIN) ×2
GAUZE SPONGE 4X4 12PLY STRL (GAUZE/BANDAGES/DRESSINGS) ×2 IMPLANT
GLOVE SURG ENC MOIS LTX SZ7.5 (GLOVE) ×6 IMPLANT
GLOVE SURG ENC MOIS LTX SZ8 (GLOVE) ×1 IMPLANT
GLOVE SURG LTX SZ8 (GLOVE) ×1 IMPLANT
GLOVE SURG ORTHO LTX SZ8 (GLOVE) ×2 IMPLANT
GLOVE SURG UNDER POLY LF SZ7 (GLOVE) ×2 IMPLANT
GOWN STRL REUS W/ TWL XL LVL3 (GOWN DISPOSABLE) ×1 IMPLANT
GOWN STRL REUS W/TWL LRG LVL3 (GOWN DISPOSABLE) ×2 IMPLANT
GOWN STRL REUS W/TWL XL LVL3 (GOWN DISPOSABLE) ×4 IMPLANT
HANDLE SUCTION POOLE (INSTRUMENTS) IMPLANT
HEMOSTAT SURGICEL 4X8 (HEMOSTASIS) ×1 IMPLANT
KIT BASIN OR (CUSTOM PROCEDURE TRAY) ×2 IMPLANT
KIT TURNOVER KIT A (KITS) ×1 IMPLANT
PACK GENERAL/GYN (CUSTOM PROCEDURE TRAY) ×2 IMPLANT
SPONGE T-LAP 18X18 ~~LOC~~+RFID (SPONGE) IMPLANT
STAPLER VISISTAT 35W (STAPLE) ×2 IMPLANT
SUCTION POOLE HANDLE (INSTRUMENTS)
SUT PDS AB 1 CTX 36 (SUTURE) IMPLANT
SUT PDS AB 1 TP1 96 (SUTURE) ×2 IMPLANT
SUT SILK 2 0 (SUTURE)
SUT SILK 2 0 SH CR/8 (SUTURE) ×2 IMPLANT
SUT SILK 2-0 18XBRD TIE 12 (SUTURE) IMPLANT
SUT SILK 3 0 (SUTURE)
SUT SILK 3 0 SH CR/8 (SUTURE) IMPLANT
SUT SILK 3-0 18XBRD TIE 12 (SUTURE) IMPLANT
TAPE CLOTH 4X10 WHT NS (GAUZE/BANDAGES/DRESSINGS) ×1 IMPLANT
TOWEL OR 17X26 10 PK STRL BLUE (TOWEL DISPOSABLE) ×4 IMPLANT
TRAY FOLEY MTR SLVR 14FR STAT (SET/KITS/TRAYS/PACK) ×1 IMPLANT
YANKAUER SUCT BULB TIP NO VENT (SUCTIONS) IMPLANT

## 2021-03-05 NOTE — ED Triage Notes (Signed)
Pt complaining of abdominal pain with intermittent n/v starting today. Pt also with complains of weakness.

## 2021-03-05 NOTE — Anesthesia Preprocedure Evaluation (Addendum)
Anesthesia Evaluation  Patient identified by MRN, date of birth, ID band Patient awake    Reviewed: Allergy & Precautions, NPO status , Patient's Chart, lab work & pertinent test results  Airway Mallampati: II   Neck ROM: Limited  Mouth opening: Limited Mouth Opening  Dental  (+) Poor Dentition, Loose, Edentulous Upper, Chipped,    Pulmonary neg pulmonary ROS,    Pulmonary exam normal breath sounds clear to auscultation       Cardiovascular hypertension, Pt. on medications and Pt. on home beta blockers  Rhythm:Regular Rate:Bradycardia  Sinus rhythm Nonspecific intraventricular conduction delay Confirmed by Davonna Belling [phone]) on 03/05/2021 8:08:33 PM  H/o left carotid endarterectomy 2019   Neuro/Psych negative neurological ROS  negative psych ROS   GI/Hepatic Neg liver ROS, Abdominal free air + nausea   Endo/Other  negative endocrine ROS  Renal/GU Renal Insufficiency and CRFRenal disease  negative genitourinary   Musculoskeletal negative musculoskeletal ROS (+)   Abdominal   Peds negative pediatric ROS (+)  Hematology  (+) anemia ,   Anesthesia Other Findings Frail, cachetic appearing  Reproductive/Obstetrics negative OB ROS                          Anesthesia Physical Anesthesia Plan  ASA: 3 and emergent  Anesthesia Plan: General   Post-op Pain Management:    Induction: Intravenous, Rapid sequence and Cricoid pressure planned  PONV Risk Score and Plan: 3 and Treatment may vary due to age or medical condition, Ondansetron and Dexamethasone  Airway Management Planned: Oral ETT  Additional Equipment:   Intra-op Plan:   Post-operative Plan: Extubation in OR and Possible Post-op intubation/ventilation  Informed Consent: I have reviewed the patients History and Physical, chart, labs and discussed the procedure including the risks, benefits and alternatives for the proposed  anesthesia with the patient or authorized representative who has indicated his/her understanding and acceptance.     Dental advisory given and Consent reviewed with POA  Plan Discussed with: CRNA, Anesthesiologist and Surgeon  Anesthesia Plan Comments: (Discussed with son Ellon Marasco. He understands that her dentition is poor and it is possible that a tooth might become dislodged. He understands that she is very ill and may need to stay intubated post-operatively. We will assess at the end of the case. We will stay in touch perioperatively and will make decisions together if there is any life-threatening cardiorespiratory event. Norton Blizzard, MD  )      Anesthesia Quick Evaluation

## 2021-03-05 NOTE — ED Provider Notes (Signed)
[place] DEPT Provider Note   CSN: [id number] Arrival date & time: 03/05/21  1921     History Chief Complaint  Patient presents with   Abdominal Pain    Kathleen Jimenez is a 87 y.o. female.   Abdominal Pain Associated symptoms: nausea   Associated symptoms: no chest pain, no shortness of breath and no vomiting   Patient presents with abdominal pain nausea without vomiting..  Started today.  Is feeling fine yesterday.  States she feels bad all over.  No fevers.  Patient's son is one of the local surgeons.  Has had previous appendectomy.  Still passing gas.  States her abdomen feels swollen.    Past Medical History:  Diagnosis Date   Anemia    Carotid stenosis, left    Chronic kidney disease    Hypertension     Patient Active Problem List   Diagnosis Date Noted   Carotid stenosis 05/19/2017   UTI due to Klebsiella species 12/30/2016   Chronic kidney disease    Closed intertrochanteric fracture of left hip ([place]) 12/27/2016   Acute blood loss anemia 12/27/2016   Hip pain, acute, left 12/26/2016   ARF (acute renal failure) ([place]) 12/26/2016   HTN (hypertension) 12/26/2016   Anemia 12/26/2016   Dehydration 12/26/2016    Past Surgical History:  Procedure Laterality Date   ENDARTERECTOMY Left 05/19/2017   Procedure: LEFT CAROTID ENDARTERECTOMY CAROTID;  Surgeon: [name], MD;  Location: [facility];  Service: Vascular;  Laterality: Left;   FEMUR IM NAIL Left 12/28/2016   Procedure: INTRAMEDULLARY (IM) NAIL INTERTROCHANTERIC LEFT;  Surgeon: Gaynelle Arabian, MD;  Location: [facility];  Service: Orthopedics;  Laterality: Left;   JOINT REPLACEMENT  approx 2012   R orif   LUMBAR FUSION  approx. 2003   PATCH ANGIOPLASTY Left 05/19/2017   Procedure: PATCH ANGIOPLASTY OF LEFT CAROTID ARTERY USING HEMASHIELD PLATIUM FINESSE PATCH;  Surgeon: [name], MD;  Location: [facility];  Service: Vascular;  Laterality: Left;   THYROID SURGERY  approx. 1960   nodule  removed     OB History   No obstetric history on file.     Family History  Problem Relation Age of Onset   Congestive Heart Failure Mother    Heart disease Mother    Stroke Father     Social History   Tobacco Use   Smoking status: Never   Smokeless tobacco: Never  Vaping Use   Vaping Use: Never used  Substance Use Topics   Alcohol use: No   Drug use: No    Home Medications Prior to Admission medications   Medication Sig Start Date End Date Taking? Authorizing Provider  Abaloparatide (TYMLOS) 3120 MCG/1.56ML SOPN Inject 80 mcg into the skin daily.    [provider]  amLODipine (NORVASC) 10 MG tablet Take 1 tablet (10 mg total) by mouth daily. 01/01/17   Thurnell Lose, MD  amLODipine (NORVASC) 5 MG tablet TAKE 1 TABLET BY MOUTH ONCE A DAY 12/05/19 12/04/20  Ginger Organ., MD  aspirin 325 MG EC tablet Take 325 mg by mouth daily.     [provider]  carvedilol (COREG) 6.25 MG tablet Take 1 tablet (6.25 mg total) by mouth 2 (two) times daily with a meal. 01/01/17   Thurnell Lose, MD  Cholecalciferol (VITAMIN D) 2000 units tablet Take 2,000 Units by mouth daily.    [provider]  hydrALAZINE (APRESOLINE) 25 MG tablet Take 1 tablet (25  mg total) by mouth every 8 (eight) hours. Patient taking differently: Take 25 mg by mouth 2 (two) times daily.  01/01/17   Thurnell Lose, MD    Allergies    Patient has no known allergies.  Review of Systems   Review of Systems  Constitutional:  Positive for appetite change.  HENT:  Negative for congestion.   Respiratory:  Negative for shortness of breath.   Cardiovascular:  Negative for chest pain.  Gastrointestinal:  Positive for abdominal pain and nausea. Negative for vomiting.  Genitourinary:  Negative for flank pain.  Musculoskeletal:  Negative for back pain.  Skin:  Negative for rash.  Neurological:  Negative for weakness.  Psychiatric/Behavioral:  Negative for confusion.    Physical  Exam Updated Vital Signs BP (!) 138/57    Pulse (!) 58    Temp 98.1 F (36.7 C) (Oral)    Resp 18    Ht 5\' 3"  (1.6 m)    Wt 52.2 kg    SpO2 96%    BMI 20.37 kg/m   Physical Exam Vitals and nursing note reviewed.  Constitutional:      Comments: Patient appears uncomfortable  HENT:     Head: Atraumatic.  Cardiovascular:     Rate and Rhythm: Normal rate and regular rhythm.  Pulmonary:     Effort: Pulmonary effort is normal.  Abdominal:     Tenderness: There is abdominal tenderness.     Hernia: No hernia is present.     Comments: Diffuse tenderness with guarding.  No hernia palpated.  Right lower quadrant scar from previous appendectomy.  Skin:    General: Skin is warm.     Capillary Refill: Capillary refill takes less than 2 seconds.  Neurological:     Mental Status: She is alert and oriented to person, place, and time.    ED Results / Procedures / Treatments   Labs (all labs ordered are listed, but only abnormal results are displayed) Labs Reviewed  COMPREHENSIVE METABOLIC PANEL - Abnormal; Notable for the following components:      Result Value   Sodium 131 (*)    Glucose, Bld 172 (*)    BUN 45 (*)    Creatinine, Ser 2.08 (*)    Calcium 8.4 (*)    Total Protein 5.8 (*)    Albumin 3.0 (*)    AST 46 (*)    ALT 50 (*)    GFR, Estimated 23 (*)    All other components within normal limits  LIPASE, BLOOD - Abnormal; Notable for the following components:   Lipase 108 (*)    All other components within normal limits  CBC WITH DIFFERENTIAL/PLATELET - Abnormal; Notable for the following components:   WBC 16.2 (*)    RBC 2.91 (*)    Hemoglobin 9.6 (*)    HCT 28.9 (*)    Neutro Abs 15.2 (*)    Lymphs Abs 0.5 (*)    All other components within normal limits  I-STAT CHEM 8, ED - Abnormal; Notable for the following components:   Sodium 133 (*)    BUN 41 (*)    Creatinine, Ser 1.90 (*)    Glucose, Bld 166 (*)    Hemoglobin 9.5 (*)    HCT 28.0 (*)    All other components  within normal limits  RESP PANEL BY RT-PCR (FLU A&B, COVID) ARPGX2  CULTURE, BLOOD (ROUTINE X 2)  CULTURE, BLOOD (ROUTINE X 2)  LACTIC ACID, PLASMA  LACTIC ACID, PLASMA  TYPE  AND SCREEN    EKG EKG Interpretation  Date/Time:  Friday March 05 2021 19:43:43 EST Ventricular Rate:  56 PR Interval:  162 QRS Duration: 147 QT Interval:  445 QTC Calculation: 430 R Axis:   44 Text Interpretation: Sinus rhythm Nonspecific intraventricular conduction delay Confirmed by Davonna Belling [phone]) on 03/05/2021 8:08:33 PM  Radiology DG Abdomen Acute W/Chest  Result Date: 03/05/2021 CLINICAL DATA:  Abdomen pain with nausea vomiting EXAM: DG ABDOMEN ACUTE WITH 1 VIEW CHEST COMPARISON:  12/26/2016 FINDINGS: Single-view chest demonstrates clear left lung field. Nodular opacity in the right upper lobe with vague right suprahilar opacity. Asymmetric density in the right hilus. Normal cardiac size with aortic atherosclerosis. Supine and upright views of the abdomen demonstrate a nonobstructed gas pattern with moderate stool. Surgical hardware in the lumbosacral spine. Upright view demonstrates air-fluid level over the liver, raising concern for free air. IMPRESSION: 1. Right apical nodularity, possibly due to lung nodule with additional vague airspace opacity in the right suprahilar lung and asymmetrically dense right hilus. Chest CT recommended for further evaluation. 2. Findings suspicious for air-fluid level over the liver, raising concern for free air. CT abdomen and pelvis is recommended for further evaluation. Critical Value/emergent results were called by telephone at the time of interpretation on 03/05/2021 at 8:44 pm to provider Davonna Belling , who verbally acknowledged these results. Electronically Signed   By: Donavan Foil M.D.   On: 03/05/2021 20:44   CT CHEST ABDOMEN PELVIS WO CONTRAST  Result Date: 03/05/2021 CLINICAL DATA:  Abdominal pain, with suspected lung nodule on chest x-ray  today. EXAM: CT CHEST, ABDOMEN AND PELVIS WITHOUT CONTRAST TECHNIQUE: Multidetector CT imaging of the chest, abdomen and pelvis was performed following the standard protocol without IV contrast. COMPARISON:  Abdomen series with PA chest performed today, portable chest 12/26/2016. FINDINGS: CT CHEST FINDINGS Cardiovascular: The aorta and coronary arteries are heavily calcified with calcification extending into the aortic branch arteries. There is a small anterior pericardial effusion. Borderline aneurysmal ascending aorta 3.9 cm, otherwise normal caliber aorta. Normal caliber central pulmonary arteries and veins noted with mild cardiomegaly. Mediastinum/Nodes: Heterogeneous mass with patchy calcifications replaces the right lobe of the thyroid gland and measures 4.0 x 3.7 x 5.3 cm. Left lobe is unremarkable. This displaces the trachea left of the midline without significant tracheal narrowing. There are calcified right hilar lymph nodes. No noncalcified thoracic or axillary adenopathy is seen without contrast. There is a paucity of body fat which may suggest cachexia. Small hiatal hernia. Lungs/Pleura: There are trace pleural effusions, with small calcified pleural plaquing in the posterior basal left chest with no other focal pleural abnormality or pneumothorax. There is a 5 mm subpleural noncalcified right middle lobe nodule laterally on series 6 axial 92. the lungs are slightly emphysematous without appreciable infiltrates. There is calcified material filling a vertically directed right upper lobe suprahilar subsegmental bronchus as well as small branching calcifications more laterally in the right upper lobe, both findings consistent with calcified endobronchial inspissated secretions. In the apicolateral right upper lobe just above this level, there are nodular and branching opacities compatible with bronchiolar mucous plugging. There is faint hazy interstitial change seen just above this in the peripheral right  upper lobe which could be due to chronic small airways disease or pneumonitis. In the right middle lobe there similar branching opacities laterally consistent with additional bronchiolar mucous plugging. This was not seen on the 2018 chest x-ray. Remainder of the lungs are clear. 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 There is no worrisome regional skeletal lesion. Mild elevation right hemidiaphragm. CT ABDOMEN PELVIS FINDINGS Hepatobiliary: There are few scattered calcified granulomas. 1.3 cm cyst of 8.7 Hounsfield units in the left lobe lateral segment with no other focal abnormality. The gallbladder is distended and contains at least 1 calcified stone proximally, measuring 1.8 cm. No biliary dilatation is seen. There is a mildly thickened gallbladder wall which could be reactive due to free fluid. Pancreas: Not well seen due to artifact related to spinal fusion hardware. No obvious mass or ductal dilatation. Spleen: Normal in size with scattered calcified granulomas. Adrenals/Urinary Tract: 3.6 cm cyst in the left upper pole measuring 1.3 Hounsfield units. Additional few bilateral small cysts, including a hyperdense 1 cm cyst in the inferior pole of the right kidney of 94 Hounsfield units. No stone is seen , no hydroureteronephrosis or other focal abnormality. Normal bladder thickness, with prolapse of the bladder base two 5 cm below the urogenital diaphragm protruding inferiorly to just below the level of the vagina. Stomach/Bowel: There is no small bowel obstruction. Fold thickening is seen in the stomach and small bowel, no thickening of the wall the colon is seen, with advanced sigmoid diverticulosis  noted. An appendix is not seen in this patient. The small bowel thickening could indicate nonspecific enteritis, congestive edema, or could be reactive from the presence of mild/moderate abdominopelvic free fluid. Vascular/Lymphatic: No adenopathy is seen. There is extensive aortoiliac/iliofemoral and visceral arterial calcific plaque. Maximum aortic diameter 2.3 cm Reproductive: The uterus is grossly intact but not well seen due to artifact from bilateral hip nailing hardware. No adnexal mass is evident. Other: There is moderate free air collecting in the upper abdomen around the liver and deep to the anterior wall to just below the level of the umbilicus. In the absence of recent laparoscopic procedure this would be consistent with a perforated hollow viscus although the site of perforation is not identified on this noncontrasted exam. No free air is seen however, in the pelvis which would be suspicious for a sigmoid perforation. There is mild body wall anasarca. Generalized mesenteric edema. Musculoskeletal: There is osteopenia, with posterior fusion hardware from L2-S1, evidence of at least mild loosening of the sacral bolts but no other hardware loosening suspected. There is posterior bone grafting at the levels of lumbar fusion. Grade 1 L5-S1 spondylolisthesis with severe acquired spinal canal stenosis at this level. There is osteopenia with degenerative changes of the spine and hips and old hip nailing hardware without apparent loosening. No worrisome bone lesion. IMPRESSION: 1. Moderate free air with mild-to-moderate free fluid in the abdomen. Findings consistent with perforated hollow viscus with the site of perforation not identified, but probably not in the pelvis. 2. Thickened small bowel segments potentially reactive due to the free fluid/leaked intestinal contents, versus other enteritis. 3. Generalized mesenteric edema/peritonitis. 4. Prolapses bladder protruding through the vagina. The ureteral  insertions are not well evaluated due to artifact from bilateral hip nailing but are suspected to be at the level of the pelvic floor. There is no upstream hydronephrosis. 5. Aortic and coronary artery atherosclerosis with mild cardiomegaly, small pericardial effusion. 6. Branching/nodular opacities in the right upper lobe laterally with calcified inspissated endobronchial material in the  right upper lobe in 2 locations. The noncalcified branching opacities are consistent with bronchiolar mucous plugging which is also seen to a lesser extent in a portion of the lateral right middle lobe. Additional findings of old granulomatous disease. Mild COPD. 7. 5.2 cm right thyroid mass with patchy calcifications displacing the trachea to the left. Ultrasound follow-up recommended when clinically feasible. 8. Small hiatal hernia.  No incarcerated hernias. 9. Generalized paucity of body fat which may be seen with cachexia, with mild body wall anasarca. 10. Cholelithiasis and mild gallbladder thickening, which could be from cholecystitis or reactive due to the adjacent fluid. 11. Constipation with sigmoid diverticulosis. 12. Discussed over the phone with Dr. Alvino Chapel at 9:04 p.m., 03/05/2021. Electronically Signed   By: Telford Nab M.D.   On: 03/05/2021 21:42    Procedures Procedures   Medications Ordered in ED Medications  HYDROmorphone (DILAUDID) injection 0.5 mg (0.5 mg Intravenous Not Given 03/05/21 2202)  ondansetron [facility]) injection 4 mg (4 mg Intravenous Given 03/05/21 1956)  HYDROmorphone (DILAUDID) injection 0.5 mg (0.5 mg Intravenous Given 03/05/21 1955)  lactated ringers bolus 1,000 mL (0 mLs Intravenous Stopped 03/05/21 2147)  piperacillin-tazobactam (ZOSYN) IVPB 3.375 g (0 g Intravenous Stopped 03/05/21 2203)  HYDROmorphone (DILAUDID) injection 1 mg (1 mg Intravenous Given 03/05/21 2119)  ondansetron (ZOFRAN) injection 4 mg (4 mg Intravenous Given 03/05/21 2216)  HYDROmorphone (DILAUDID)  injection 1 mg (1 mg Intravenous Given 03/05/21 2217)    ED Course  I have reviewed the triage vital signs and the nursing notes.  Pertinent labs & imaging results that were available during my care of the patient were reviewed by me and considered in my medical decision making (see chart for details).    MDM Rules/Calculators/A&P                         Patient abdominal pain.  Nausea vomiting.  Began yesterday.  Afebrile but very tender abdomen.  Acute abdominal series done and showed possible free air along with potential lung mass.  CT scan done emergently.  Noncontrast due to renal dysfunction.  Showed perforated viscus without a definite clear spot.  Potentially could be from stomach ulcer.  White count is elevated.  Had been given antibiotics.  Discussed with her son Dr. Harlow Asa and Dr. Marlou Starks from surgery.  We will be taking her to the OR.  CRITICAL CARE Performed by: Davonna Belling Total critical care time: 30 minutes Critical care time was exclusive of separately billable procedures and treating other patients. Critical care was necessary to treat or prevent imminent or life-threatening deterioration. Critical care was time spent personally by me on the following activities: development of treatment plan with patient and/or surrogate as well as nursing, discussions with consultants, evaluation of patient's response to treatment, examination of patient, obtaining history from patient or surrogate, ordering and performing treatments and interventions, ordering and review of laboratory studies, ordering and review of radiographic studies, pulse oximetry and re-evaluation of patient's condition.     Final Clinical Impression(s) / ED Diagnoses Final diagnoses:  Perforated abdominal viscus    Rx / DC Orders ED Discharge Orders     None        Davonna Belling, MD 03/05/21 2222

## 2021-03-05 NOTE — ED Notes (Signed)
Pt transferred to the OR at this time with the OR tech,

## 2021-03-05 NOTE — Anesthesia Procedure Notes (Signed)
Procedure Name: Intubation Date/Time: 03/05/2021 11:02 PM Performed by: Lissa Morales, CRNA Pre-anesthesia Checklist: Patient identified, Emergency Drugs available, Suction available and Patient being monitored Patient Re-evaluated:Patient Re-evaluated prior to induction Oxygen Delivery Method: Circle system utilized Preoxygenation: Pre-oxygenation with 100% oxygen Induction Type: IV induction, Rapid sequence and Cricoid Pressure applied Laryngoscope Size: Mac and 4 Grade View: Grade I Tube type: Oral Tube size: 7.5 mm Number of attempts: 1 Airway Equipment and Method: Stylet and Oral airway Placement Confirmation: ETT inserted through vocal cords under direct vision, positive ETCO2 and breath sounds checked- equal and bilateral Secured at: 21 cm Tube secured with: Tape Dental Injury: Teeth and Oropharynx as per pre-operative assessment  Comments: Very loose toth lower front jaw still intact. Atraumatic intubation

## 2021-03-05 NOTE — H&P (Signed)
Kathleen Jimenez is an 87 y.o. female.   Chief Complaint: abd pain HPI: The patient is an 87 year old white female who is the mother of one of our partners.  She is a resident at [facility].  She states that she started having abdominal pain yesterday.  The family states she has had some problems with sour taste in her mouth for several weeks.  She has not been eating much.  When the pain persisted she was brought to the emergency department where a CT scan was significant for free air consistent with bowel perforation.  The area of significance seems to be higher in the GI tract possibly near the stomach such as a perforated ulcer.  There does not appear to be a lot of inflammation down in the pelvis.  Past Medical History:  Diagnosis Date   Anemia    Carotid stenosis, left    Chronic kidney disease    Hypertension     Past Surgical History:  Procedure Laterality Date   ENDARTERECTOMY Left 05/19/2017   Procedure: LEFT CAROTID ENDARTERECTOMY CAROTID;  Surgeon: [name], MD;  Location: [facility];  Service: Vascular;  Laterality: Left;   FEMUR IM NAIL Left 12/28/2016   Procedure: INTRAMEDULLARY (IM) NAIL INTERTROCHANTERIC LEFT;  Surgeon: Gaynelle Arabian, MD;  Location: [facility];  Service: Orthopedics;  Laterality: Left;   JOINT REPLACEMENT  approx 2012   R orif   LUMBAR FUSION  approx. 2003   PATCH ANGIOPLASTY Left 05/19/2017   Procedure: PATCH ANGIOPLASTY OF LEFT CAROTID ARTERY USING HEMASHIELD PLATIUM FINESSE PATCH;  Surgeon: [name], MD;  Location: [facility];  Service: Vascular;  Laterality: Left;   THYROID SURGERY  approx. 1960   nodule removed    Family History  Problem Relation Age of Onset   Congestive Heart Failure Mother    Heart disease Mother    Stroke Father    Social History:  reports that she has never smoked. She has never used smokeless tobacco. She reports that she does not drink alcohol and does not use drugs.  Allergies: No Known Allergies  (Not in a hospital  admission)   Results for orders placed or performed during the hospital encounter of 03/05/21 (from the past 48 hour(s))  Comprehensive metabolic panel     Status: Abnormal   Collection Time: 03/05/21  7:49 PM  Result Value Ref Range   Sodium 131 (L) 135 - 145 mmol/L   Potassium 4.6 3.5 - 5.1 mmol/L   Chloride 104 98 - 111 mmol/L   CO2 22 22 - 32 mmol/L   Glucose, Bld 172 (H) 70 - 99 mg/dL    Comment: Glucose reference range applies only to samples taken after fasting for at least 8 hours.   BUN 45 (H) 8 - 23 mg/dL   Creatinine, Ser 2.08 (H) 0.44 - 1.00 mg/dL   Calcium 8.4 (L) 8.9 - 10.3 mg/dL   Total Protein 5.8 (L) 6.5 - 8.1 g/dL   Albumin 3.0 (L) 3.5 - 5.0 g/dL   AST 46 (H) 15 - 41 U/L   ALT 50 (H) 0 - 44 U/L   Alkaline Phosphatase 65 38 - 126 U/L   Total Bilirubin 0.6 0.3 - 1.2 mg/dL   GFR, Estimated 23 (L) >60 mL/min    Comment: (NOTE) Calculated using the CKD-EPI Creatinine Equation (2021)    Anion gap 5 5 - 15    Comment: Performed at [facility], [place] [address], [place],  [address]  Lipase, blood     Status: Abnormal   Collection Time: 03/05/21  7:49 PM  Result Value Ref Range   Lipase 108 (H) 11 - 51 U/L    Comment: Performed at [facility], [place] [address] [place], [place] [address]  CBC with Differential     Status: Abnormal   Collection Time: 03/05/21  7:49 PM  Result Value Ref Range   WBC 16.2 (H) 4.0 - 10.5 K/uL   RBC 2.91 (L) 3.87 - 5.11 MIL/uL   Hemoglobin 9.6 (L) 12.0 - 15.0 g/dL   HCT 28.9 (L) 36.0 - 46.0 %   MCV 99.3 80.0 - 100.0 fL   MCH 33.0 26.0 - 34.0 pg   MCHC 33.2 30.0 - 36.0 g/dL   RDW 12.5 11.5 - 15.5 %   Platelets 338 150 - 400 K/uL   nRBC 0.0 0.0 - 0.2 %   Neutrophils Relative % 94 %   Neutro Abs 15.2 (H) 1.7 - 7.7 K/uL   Lymphocytes Relative 3 %   Lymphs Abs 0.5 (L) 0.7 - 4.0 K/uL   Monocytes Relative 3 %   Monocytes Absolute 0.5 0.1 - 1.0 K/uL   Eosinophils Relative 0 %   Eosinophils  Absolute 0.0 0.0 - 0.5 K/uL   Basophils Relative 0 %   Basophils Absolute 0.0 0.0 - 0.1 K/uL   Immature Granulocytes 0 %   Abs Immature Granulocytes 0.05 0.00 - 0.07 K/uL    Comment: Performed at [facility], [place] [address] [place], [place] [address]  Lactic acid, plasma     Status: None   Collection Time: 03/05/21  7:49 PM  Result Value Ref Range   Lactic Acid, Venous 1.1 0.5 - 1.9 mmol/L    Comment: Performed at [facility], [place] [address], [place], [place] [address]  Resp Panel by RT-PCR (Flu A&B, Covid) Nasopharyngeal Swab     Status: None   Collection Time: 03/05/21  7:50 PM   Specimen: Nasopharyngeal Swab; Nasopharyngeal(NP) swabs in vial transport medium  Result Value Ref Range   SARS Coronavirus 2 by RT PCR NEGATIVE NEGATIVE    Comment: (NOTE) SARS-CoV-2 target nucleic acids are NOT DETECTED.  The SARS-CoV-2 RNA is generally detectable in upper respiratory specimens during the acute phase of infection. The lowest concentration of SARS-CoV-2 viral copies this assay can detect is 138 copies/mL. A negative result does not preclude SARS-Cov-2 infection and should not be used as the sole basis for treatment or other patient management decisions. A negative result may occur with  improper specimen collection/handling, submission of specimen other than nasopharyngeal swab, presence of viral mutation(s) within the areas targeted by this assay, and inadequate number of viral copies(<138 copies/mL). A negative result must be combined with clinical observations, patient history, and epidemiological information. The expected result is Negative.  Fact Sheet for Patients:  EntrepreneurPulse.com.au  Fact Sheet for Healthcare Providers:  IncredibleEmployment.be  This test is no t yet approved or cleared by the [place] FDA and  has been authorized for detection and/or diagnosis of SARS-CoV-2 by FDA under  an Emergency Use Authorization (EUA). This EUA will remain  in effect (meaning this test can be used) for the duration of the COVID-19 declaration under Section 564(b)(1) of the Act, 21 U.S.C.section 360bbb-3(b)(1), unless the authorization is terminated  or revoked sooner.       Influenza A by PCR NEGATIVE NEGATIVE   Influenza B by PCR NEGATIVE NEGATIVE    Comment: (NOTE)  The Xpert Xpress SARS-CoV-2/FLU/RSV plus assay is intended as an aid in the diagnosis of influenza from Nasopharyngeal swab specimens and should not be used as a sole basis for treatment. Nasal washings and aspirates are unacceptable for Xpert Xpress SARS-CoV-2/FLU/RSV testing.  Fact Sheet for Patients: EntrepreneurPulse.com.au  Fact Sheet for Healthcare Providers: IncredibleEmployment.be  This test is not yet approved or cleared by the [place] FDA and has been authorized for detection and/or diagnosis of SARS-CoV-2 by FDA under an Emergency Use Authorization (EUA). This EUA will remain in effect (meaning this test can be used) for the duration of the COVID-19 declaration under Section 564(b)(1) of the Act, 21 U.S.C. section 360bbb-3(b)(1), unless the authorization is terminated or revoked.  Performed at [facility], [place] [address], [place], [place] [address]   Ginger Carne 8, ED     Status: Abnormal   Collection Time: 03/05/21  8:12 PM  Result Value Ref Range   Sodium 133 (L) 135 - 145 mmol/L   Potassium 4.5 3.5 - 5.1 mmol/L   Chloride 102 98 - 111 mmol/L   BUN 41 (H) 8 - 23 mg/dL   Creatinine, Ser 1.90 (H) 0.44 - 1.00 mg/dL   Glucose, Bld 166 (H) 70 - 99 mg/dL    Comment: Glucose reference range applies only to samples taken after fasting for at least 8 hours.   Calcium, Ion 1.21 1.15 - 1.40 mmol/L   TCO2 22 22 - 32 mmol/L   Hemoglobin 9.5 (L) 12.0 - 15.0 g/dL   HCT 28.0 (L) 36.0 - 46.0 %   DG Abdomen Acute W/Chest  Result Date:  03/05/2021 CLINICAL DATA:  Abdomen pain with nausea vomiting EXAM: DG ABDOMEN ACUTE WITH 1 VIEW CHEST COMPARISON:  12/26/2016 FINDINGS: Single-view chest demonstrates clear left lung field. Nodular opacity in the right upper lobe with vague right suprahilar opacity. Asymmetric density in the right hilus. Normal cardiac size with aortic atherosclerosis. Supine and upright views of the abdomen demonstrate a nonobstructed gas pattern with moderate stool. Surgical hardware in the lumbosacral spine. Upright view demonstrates air-fluid level over the liver, raising concern for free air. IMPRESSION: 1. Right apical nodularity, possibly due to lung nodule with additional vague airspace opacity in the right suprahilar lung and asymmetrically dense right hilus. Chest CT recommended for further evaluation. 2. Findings suspicious for air-fluid level over the liver, raising concern for free air. CT abdomen and pelvis is recommended for further evaluation. Critical Value/emergent results were called by telephone at the time of interpretation on 03/05/2021 at 8:44 pm to provider Davonna Belling , who verbally acknowledged these results. Electronically Signed   By: Donavan Foil M.D.   On: 03/05/2021 20:44   CT CHEST ABDOMEN PELVIS WO CONTRAST  Result Date: 03/05/2021 CLINICAL DATA:  Abdominal pain, with suspected lung nodule on chest x-ray today. EXAM: CT CHEST, ABDOMEN AND PELVIS WITHOUT CONTRAST TECHNIQUE: Multidetector CT imaging of the chest, abdomen and pelvis was performed following the standard protocol without IV contrast. COMPARISON:  Abdomen series with PA chest performed today, portable chest 12/26/2016. FINDINGS: CT CHEST FINDINGS Cardiovascular: The aorta and coronary arteries are heavily calcified with calcification extending into the aortic branch arteries. There is a small anterior pericardial effusion. Borderline aneurysmal ascending aorta 3.9 cm, otherwise normal caliber aorta. Normal caliber central  pulmonary arteries and veins noted with mild cardiomegaly. Mediastinum/Nodes: Heterogeneous mass with patchy calcifications replaces the right lobe of the thyroid gland and measures 4.0 x 3.7 x 5.3 cm. Left lobe is  unremarkable. This displaces the trachea left of the midline without significant tracheal narrowing. There are calcified right hilar lymph nodes. No noncalcified thoracic or axillary adenopathy is seen without contrast. There is a paucity of body fat which may suggest cachexia. Small hiatal hernia. Lungs/Pleura: There are trace pleural effusions, with small calcified pleural plaquing in the posterior basal left chest with no other focal pleural abnormality or pneumothorax. There is a 5 mm subpleural noncalcified right middle lobe nodule laterally on series 6 axial 92. the lungs are slightly emphysematous without appreciable infiltrates. There is calcified material filling a vertically directed right upper lobe suprahilar subsegmental bronchus as well as small branching calcifications more laterally in the right upper lobe, both findings consistent with calcified endobronchial inspissated secretions. In the apicolateral right upper lobe just above this level, there are nodular and branching opacities compatible with bronchiolar mucous plugging. There is faint hazy interstitial change seen just above this in the peripheral right upper lobe which could be due to chronic small airways disease or pneumonitis. In the right middle lobe there similar branching opacities laterally consistent with additional bronchiolar mucous plugging. This was not seen on the 2018 chest x-ray. Remainder of the lungs are clear. 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 There is no worrisome regional skeletal lesion. Mild elevation right hemidiaphragm. CT ABDOMEN PELVIS FINDINGS Hepatobiliary: There are few scattered calcified granulomas. 1.3 cm cyst of 8.7 Hounsfield units in the left lobe lateral segment with no other focal abnormality. The gallbladder is distended and contains at least 1 calcified stone proximally, measuring 1.8 cm. No biliary dilatation is seen. There is a mildly thickened gallbladder wall which could be reactive due to free fluid. Pancreas: Not well seen due to artifact related to spinal fusion hardware. No obvious mass or ductal dilatation. Spleen: Normal in size with scattered calcified granulomas. Adrenals/Urinary Tract: 3.6 cm cyst in the left upper pole measuring 1.3 Hounsfield units. Additional few bilateral small cysts, including a hyperdense 1 cm cyst in the inferior pole of the right kidney of 94 Hounsfield units. No stone is seen , no hydroureteronephrosis or other focal abnormality. Normal bladder thickness, with prolapse of the bladder base two 5 cm below the urogenital diaphragm protruding inferiorly to just below the level of the vagina. Stomach/Bowel: There is no small bowel obstruction. Fold thickening is seen in the stomach and small bowel, no thickening of the wall the colon is seen, with advanced sigmoid diverticulosis noted. An appendix is not seen in this patient. The small bowel thickening could indicate nonspecific enteritis, congestive edema, or could be reactive from the presence of mild/moderate abdominopelvic free fluid. Vascular/Lymphatic: No adenopathy is seen. There is extensive aortoiliac/iliofemoral and visceral arterial calcific plaque. Maximum aortic diameter 2.3 cm Reproductive: The uterus is grossly intact but not well seen due to artifact from bilateral hip nailing hardware. No adnexal mass is evident. Other: There is moderate free air collecting in the upper abdomen around the  liver and deep to the anterior wall to just below the level of the umbilicus. In the absence of recent laparoscopic procedure this would be consistent with a perforated hollow viscus although the site of perforation is not identified on this noncontrasted exam. No free air is  seen however, in the pelvis which would be suspicious for a sigmoid perforation. There is mild body wall anasarca. Generalized mesenteric edema. Musculoskeletal: There is osteopenia, with posterior fusion hardware from L2-S1, evidence of at least mild loosening of the sacral bolts but no other hardware loosening suspected. There is posterior bone grafting at the levels of lumbar fusion. Grade 1 L5-S1 spondylolisthesis with severe acquired spinal canal stenosis at this level. There is osteopenia with degenerative changes of the spine and hips and old hip nailing hardware without apparent loosening. No worrisome bone lesion. IMPRESSION: 1. Moderate free air with mild-to-moderate free fluid in the abdomen. Findings consistent with perforated hollow viscus with the site of perforation not identified, but probably not in the pelvis. 2. Thickened small bowel segments potentially reactive due to the free fluid/leaked intestinal contents, versus other enteritis. 3. Generalized mesenteric edema/peritonitis. 4. Prolapses bladder protruding through the vagina. The ureteral insertions are not well evaluated due to artifact from bilateral hip nailing but are suspected to be at the level of the pelvic floor. There is no upstream hydronephrosis. 5. Aortic and coronary artery atherosclerosis with mild cardiomegaly, small pericardial effusion. 6. Branching/nodular opacities in the right upper lobe laterally with calcified inspissated endobronchial material in the right upper lobe in 2 locations. The noncalcified branching opacities are consistent with bronchiolar mucous plugging which is also seen to a lesser extent in a portion of the lateral right middle  lobe. Additional findings of old granulomatous disease. Mild COPD. 7. 5.2 cm right thyroid mass with patchy calcifications displacing the trachea to the left. Ultrasound follow-up recommended when clinically feasible. 8. Small hiatal hernia.  No incarcerated hernias. 9. Generalized paucity of body fat which may be seen with cachexia, with mild body wall anasarca. 10. Cholelithiasis and mild gallbladder thickening, which could be from cholecystitis or reactive due to the adjacent fluid. 11. Constipation with sigmoid diverticulosis. 12. Discussed over the phone with Dr. Alvino Chapel at 9:04 p.m., 03/05/2021. Electronically Signed   By: Telford Nab M.D.   On: 03/05/2021 21:42    Review of Systems  Constitutional: Negative.   HENT: Negative.    Eyes: Negative.   Respiratory: Negative.    Cardiovascular: Negative.   Gastrointestinal:  Positive for abdominal pain and nausea.  Endocrine: Negative.   Genitourinary: Negative.   Musculoskeletal: Negative.   Skin: Negative.   Allergic/Immunologic: Negative.   Neurological: Negative.   Hematological: Negative.   Psychiatric/Behavioral: Negative.     Blood pressure (!) 138/57, pulse (!) 58, temperature 98.1 F (36.7 C), temperature source Oral, resp. rate 18, height 5\' 3"  (1.6 m), weight 52.2 kg, SpO2 96 %. Physical Exam Constitutional:      General: She is in acute distress.     Appearance: She is ill-appearing.  HENT:     Head: Normocephalic and atraumatic.     Right Ear: External ear normal.     Left Ear: External ear normal.     Nose: Nose normal.     Mouth/Throat:     Mouth: Mucous membranes are moist.     Pharynx: Oropharynx is clear.  Eyes:     General: No scleral icterus.    Extraocular Movements: Extraocular movements intact.     Conjunctiva/sclera: Conjunctivae normal.     Pupils: Pupils are equal, round, and reactive to light.  Cardiovascular:     Rate and Rhythm: Normal rate and regular rhythm.  Pulmonary:     Effort: Pulmonary  effort is normal. No respiratory distress.  Breath sounds: Normal breath sounds.  Abdominal:     Comments: There is severe abd pain with peritonitis  Musculoskeletal:     Right lower leg: Edema present.     Left lower leg: Edema present.     Comments: Elderly and frail. Ambulates with walker  Skin:    General: Skin is warm and dry.     Coloration: Skin is not jaundiced.  Neurological:     General: No focal deficit present.     Mental Status: She is oriented to person, place, and time.  Psychiatric:        Behavior: Behavior normal.        Thought Content: Thought content normal.     Assessment/Plan The patient appears to have free air from a perforated bowel most likely of the stomach.  Because of the risk of overwhelming infection I would recommend that she undergo surgery tonight.  She will need an exploratory laparotomy with repair of the perforation.  I have discussed with her and her caregiver who happens to be Dr. Harlow Asa the risks and benefits of the surgery as well as some of the technical aspects and they understand and wish to proceed.  They have stated that if she has generalized ischemic bowel they would not want any heroic measures taken.  They understand that she may be on a ventilator temporarily after surgery.  She will need to go to the ICU postoperatively and we will consult the critical care team.  We will start her on broad-spectrum antibiotic therapy  Autumn Messing III, MD 03/05/2021, 10:18 PM

## 2021-03-06 ENCOUNTER — Inpatient Hospital Stay (HOSPITAL_COMMUNITY): Payer: PPO

## 2021-03-06 ENCOUNTER — Other Ambulatory Visit: Payer: Self-pay

## 2021-03-06 DIAGNOSIS — R451 Restlessness and agitation: Secondary | ICD-10-CM | POA: Diagnosis not present

## 2021-03-06 DIAGNOSIS — R54 Age-related physical debility: Secondary | ICD-10-CM | POA: Diagnosis present

## 2021-03-06 DIAGNOSIS — K828 Other specified diseases of gallbladder: Secondary | ICD-10-CM | POA: Diagnosis not present

## 2021-03-06 DIAGNOSIS — Z9889 Other specified postprocedural states: Secondary | ICD-10-CM | POA: Diagnosis not present

## 2021-03-06 DIAGNOSIS — L89151 Pressure ulcer of sacral region, stage 1: Secondary | ICD-10-CM | POA: Diagnosis present

## 2021-03-06 DIAGNOSIS — I129 Hypertensive chronic kidney disease with stage 1 through stage 4 chronic kidney disease, or unspecified chronic kidney disease: Secondary | ICD-10-CM | POA: Diagnosis present

## 2021-03-06 DIAGNOSIS — R7401 Elevation of levels of liver transaminase levels: Secondary | ICD-10-CM | POA: Diagnosis not present

## 2021-03-06 DIAGNOSIS — F05 Delirium due to known physiological condition: Secondary | ICD-10-CM | POA: Diagnosis not present

## 2021-03-06 DIAGNOSIS — K3189 Other diseases of stomach and duodenum: Secondary | ICD-10-CM | POA: Diagnosis not present

## 2021-03-06 DIAGNOSIS — I739 Peripheral vascular disease, unspecified: Secondary | ICD-10-CM | POA: Diagnosis not present

## 2021-03-06 DIAGNOSIS — R198 Other specified symptoms and signs involving the digestive system and abdomen: Secondary | ICD-10-CM | POA: Diagnosis present

## 2021-03-06 DIAGNOSIS — D631 Anemia in chronic kidney disease: Secondary | ICD-10-CM | POA: Diagnosis present

## 2021-03-06 DIAGNOSIS — R34 Anuria and oliguria: Secondary | ICD-10-CM | POA: Diagnosis not present

## 2021-03-06 DIAGNOSIS — I517 Cardiomegaly: Secondary | ICD-10-CM | POA: Diagnosis not present

## 2021-03-06 DIAGNOSIS — R062 Wheezing: Secondary | ICD-10-CM | POA: Diagnosis not present

## 2021-03-06 DIAGNOSIS — K650 Generalized (acute) peritonitis: Secondary | ICD-10-CM | POA: Diagnosis present

## 2021-03-06 DIAGNOSIS — J9600 Acute respiratory failure, unspecified whether with hypoxia or hypercapnia: Secondary | ICD-10-CM | POA: Diagnosis present

## 2021-03-06 DIAGNOSIS — D62 Acute posthemorrhagic anemia: Secondary | ICD-10-CM | POA: Diagnosis not present

## 2021-03-06 DIAGNOSIS — R079 Chest pain, unspecified: Secondary | ICD-10-CM | POA: Diagnosis not present

## 2021-03-06 DIAGNOSIS — E785 Hyperlipidemia, unspecified: Secondary | ICD-10-CM | POA: Diagnosis not present

## 2021-03-06 DIAGNOSIS — E569 Vitamin deficiency, unspecified: Secondary | ICD-10-CM | POA: Diagnosis not present

## 2021-03-06 DIAGNOSIS — N184 Chronic kidney disease, stage 4 (severe): Secondary | ICD-10-CM | POA: Diagnosis present

## 2021-03-06 DIAGNOSIS — N179 Acute kidney failure, unspecified: Secondary | ICD-10-CM | POA: Diagnosis present

## 2021-03-06 DIAGNOSIS — N189 Chronic kidney disease, unspecified: Secondary | ICD-10-CM | POA: Diagnosis not present

## 2021-03-06 DIAGNOSIS — K6389 Other specified diseases of intestine: Secondary | ICD-10-CM | POA: Diagnosis not present

## 2021-03-06 DIAGNOSIS — R0602 Shortness of breath: Secondary | ICD-10-CM | POA: Diagnosis not present

## 2021-03-06 DIAGNOSIS — Z20822 Contact with and (suspected) exposure to covid-19: Secondary | ICD-10-CM | POA: Diagnosis present

## 2021-03-06 DIAGNOSIS — Z981 Arthrodesis status: Secondary | ICD-10-CM | POA: Diagnosis not present

## 2021-03-06 DIAGNOSIS — Z8249 Family history of ischemic heart disease and other diseases of the circulatory system: Secondary | ICD-10-CM | POA: Diagnosis not present

## 2021-03-06 DIAGNOSIS — J9601 Acute respiratory failure with hypoxia: Secondary | ICD-10-CM | POA: Diagnosis present

## 2021-03-06 DIAGNOSIS — J9690 Respiratory failure, unspecified, unspecified whether with hypoxia or hypercapnia: Secondary | ICD-10-CM | POA: Diagnosis not present

## 2021-03-06 DIAGNOSIS — R64 Cachexia: Secondary | ICD-10-CM | POA: Diagnosis present

## 2021-03-06 DIAGNOSIS — E440 Moderate protein-calorie malnutrition: Secondary | ICD-10-CM | POA: Diagnosis present

## 2021-03-06 DIAGNOSIS — R339 Retention of urine, unspecified: Secondary | ICD-10-CM | POA: Diagnosis not present

## 2021-03-06 DIAGNOSIS — E872 Acidosis, unspecified: Secondary | ICD-10-CM | POA: Diagnosis not present

## 2021-03-06 DIAGNOSIS — M79641 Pain in right hand: Secondary | ICD-10-CM | POA: Diagnosis not present

## 2021-03-06 DIAGNOSIS — K261 Acute duodenal ulcer with perforation: Secondary | ICD-10-CM | POA: Diagnosis not present

## 2021-03-06 DIAGNOSIS — E871 Hypo-osmolality and hyponatremia: Secondary | ICD-10-CM | POA: Diagnosis not present

## 2021-03-06 DIAGNOSIS — G9341 Metabolic encephalopathy: Secondary | ICD-10-CM | POA: Diagnosis not present

## 2021-03-06 DIAGNOSIS — K269 Duodenal ulcer, unspecified as acute or chronic, without hemorrhage or perforation: Secondary | ICD-10-CM | POA: Diagnosis present

## 2021-03-06 DIAGNOSIS — I10 Essential (primary) hypertension: Secondary | ICD-10-CM | POA: Diagnosis not present

## 2021-03-06 DIAGNOSIS — K265 Chronic or unspecified duodenal ulcer with perforation: Principal | ICD-10-CM

## 2021-03-06 DIAGNOSIS — R338 Other retention of urine: Secondary | ICD-10-CM | POA: Diagnosis not present

## 2021-03-06 DIAGNOSIS — K8020 Calculus of gallbladder without cholecystitis without obstruction: Secondary | ICD-10-CM | POA: Diagnosis not present

## 2021-03-06 DIAGNOSIS — R531 Weakness: Secondary | ICD-10-CM | POA: Diagnosis not present

## 2021-03-06 DIAGNOSIS — J90 Pleural effusion, not elsewhere classified: Secondary | ICD-10-CM | POA: Diagnosis not present

## 2021-03-06 DIAGNOSIS — M25531 Pain in right wrist: Secondary | ICD-10-CM | POA: Diagnosis not present

## 2021-03-06 DIAGNOSIS — J9811 Atelectasis: Secondary | ICD-10-CM | POA: Diagnosis not present

## 2021-03-06 DIAGNOSIS — K5939 Other megacolon: Secondary | ICD-10-CM | POA: Diagnosis not present

## 2021-03-06 DIAGNOSIS — Z4682 Encounter for fitting and adjustment of non-vascular catheter: Secondary | ICD-10-CM | POA: Diagnosis not present

## 2021-03-06 DIAGNOSIS — I959 Hypotension, unspecified: Secondary | ICD-10-CM | POA: Diagnosis not present

## 2021-03-06 DIAGNOSIS — Z48815 Encounter for surgical aftercare following surgery on the digestive system: Secondary | ICD-10-CM | POA: Diagnosis not present

## 2021-03-06 LAB — BASIC METABOLIC PANEL
Anion gap: 5 (ref 5–15)
BUN: 45 mg/dL — ABNORMAL HIGH (ref 8–23)
CO2: 22 mmol/L (ref 22–32)
Calcium: 7.9 mg/dL — ABNORMAL LOW (ref 8.9–10.3)
Chloride: 104 mmol/L (ref 98–111)
Creatinine, Ser: 2.02 mg/dL — ABNORMAL HIGH (ref 0.44–1.00)
GFR, Estimated: 23 mL/min — ABNORMAL LOW (ref >60)
Glucose, Bld: 114 mg/dL — ABNORMAL HIGH (ref 70–99)
Potassium: 5.4 mmol/L — ABNORMAL HIGH (ref 3.5–5.1)
Sodium: 131 mmol/L — ABNORMAL LOW (ref 135–145)

## 2021-03-06 LAB — CBC
HCT: 27.2 % — ABNORMAL LOW (ref 36.0–46.0)
HCT: 25.2 % — ABNORMAL LOW (ref 36.0–46.0)
Hemoglobin: 8.9 g/dL — ABNORMAL LOW (ref 12.0–15.0)
Hemoglobin: 8.2 g/dL — ABNORMAL LOW (ref 12.0–15.0)
MCH: 32 pg (ref 26.0–34.0)
MCH: 32.2 pg (ref 26.0–34.0)
MCHC: 32.7 g/dL (ref 30.0–36.0)
MCHC: 32.5 g/dL (ref 30.0–36.0)
MCV: 97.8 fL (ref 80.0–100.0)
MCV: 98.8 fL (ref 80.0–100.0)
Platelets: 330 10*3/uL (ref 150–400)
Platelets: 249 10*3/uL (ref 150–400)
RBC: 2.78 MIL/uL — ABNORMAL LOW (ref 3.87–5.11)
RBC: 2.55 MIL/uL — ABNORMAL LOW (ref 3.87–5.11)
RDW: 12.5 % (ref 11.5–15.5)
RDW: 12.5 % (ref 11.5–15.5)
WBC: 14.2 10*3/uL — ABNORMAL HIGH (ref 4.0–10.5)
WBC: 14.5 10*3/uL — ABNORMAL HIGH (ref 4.0–10.5)
nRBC: 0 % (ref 0.0–0.2)
nRBC: 0 % (ref 0.0–0.2)

## 2021-03-06 LAB — COMPREHENSIVE METABOLIC PANEL
ALT: 50 U/L — ABNORMAL HIGH (ref 0–44)
AST: 56 U/L — ABNORMAL HIGH (ref 15–41)
Albumin: 2.8 g/dL — ABNORMAL LOW (ref 3.5–5.0)
Alkaline Phosphatase: 50 U/L (ref 38–126)
Anion gap: 6 (ref 5–15)
BUN: 42 mg/dL — ABNORMAL HIGH (ref 8–23)
CO2: 21 mmol/L — ABNORMAL LOW (ref 22–32)
Calcium: 7.8 mg/dL — ABNORMAL LOW (ref 8.9–10.3)
Chloride: 104 mmol/L (ref 98–111)
Creatinine, Ser: 1.95 mg/dL — ABNORMAL HIGH (ref 0.44–1.00)
GFR, Estimated: 24 mL/min — ABNORMAL LOW (ref >60)
Glucose, Bld: 132 mg/dL — ABNORMAL HIGH (ref 70–99)
Potassium: 4.9 mmol/L (ref 3.5–5.1)
Sodium: 131 mmol/L — ABNORMAL LOW (ref 135–145)
Total Bilirubin: 0.8 mg/dL (ref 0.3–1.2)
Total Protein: 5 g/dL — ABNORMAL LOW (ref 6.5–8.1)

## 2021-03-06 LAB — LACTIC ACID, PLASMA: Lactic Acid, Venous: 1.8 mmol/L (ref 0.5–1.9)

## 2021-03-06 LAB — MRSA NEXT GEN BY PCR, NASAL: MRSA by PCR Next Gen: NOT DETECTED

## 2021-03-06 MED ORDER — ENOXAPARIN SODIUM 30 MG/0.3ML IJ SOSY
30.0000 mg | PREFILLED_SYRINGE | INTRAMUSCULAR | Status: DC
  Administered 2021-03-07 – 2021-03-30 (×24): 30 mg via SUBCUTANEOUS
  Filled 2021-03-06 (×23): qty 0.3

## 2021-03-06 MED ORDER — PANTOPRAZOLE SODIUM 40 MG IV SOLR
40.0000 mg | Freq: Every day | INTRAVENOUS | Status: DC
  Administered 2021-03-06: 40 mg via INTRAVENOUS
  Filled 2021-03-06: qty 40

## 2021-03-06 MED ORDER — DIPHENHYDRAMINE HCL 50 MG/ML IJ SOLN
25.0000 mg | Freq: Once | INTRAMUSCULAR | Status: AC
  Administered 2021-03-06: 25 mg via INTRAVENOUS
  Filled 2021-03-06: qty 1

## 2021-03-06 MED ORDER — ONDANSETRON HCL 4 MG/2ML IJ SOLN: 4.0000 mg | Freq: Once | INTRAMUSCULAR | Status: DC | PRN

## 2021-03-06 MED ORDER — SODIUM CHLORIDE 0.9 % IV SOLN: INTRAVENOUS | Status: DC

## 2021-03-06 MED ORDER — DROPERIDOL 2.5 MG/ML IJ SOLN: 0.6250 mg | Freq: Once | INTRAMUSCULAR | Status: DC | PRN

## 2021-03-06 MED ORDER — DEXMEDETOMIDINE HCL IN NACL 200 MCG/50ML IV SOLN
0.4000 ug/kg/h | INTRAVENOUS | Status: DC
  Administered 2021-03-06: 0.4 ug/kg/h via INTRAVENOUS
  Administered 2021-03-07: 02:00:00 1.1 ug/kg/h via INTRAVENOUS
  Filled 2021-03-06 (×2): qty 50

## 2021-03-06 MED ORDER — ONDANSETRON HCL 4 MG/2ML IJ SOLN
4.0000 mg | Freq: Four times a day (QID) | INTRAMUSCULAR | Status: DC | PRN
  Administered 2021-03-06 – 2021-03-20 (×4): 4 mg via INTRAVENOUS
  Filled 2021-03-06 (×4): qty 2

## 2021-03-06 MED ORDER — HYDROMORPHONE HCL 1 MG/ML IJ SOLN
INTRAMUSCULAR | Status: DC | PRN
  Administered 2021-03-06 (×3): .5 mg via INTRAVENOUS

## 2021-03-06 MED ORDER — ONDANSETRON 4 MG PO TBDP: 4.0000 mg | ORAL_TABLET | Freq: Four times a day (QID) | ORAL | Status: DC | PRN

## 2021-03-06 MED ORDER — PIPERACILLIN-TAZOBACTAM IN DEX 2-0.25 GM/50ML IV SOLN
2.2500 g | Freq: Four times a day (QID) | INTRAVENOUS | Status: AC
  Administered 2021-03-06 – 2021-03-12 (×27): 2.25 g via INTRAVENOUS
  Filled 2021-03-06 (×29): qty 50

## 2021-03-06 MED ORDER — CHLORHEXIDINE GLUCONATE CLOTH 2 % EX PADS
6.0000 | MEDICATED_PAD | Freq: Every day | CUTANEOUS | Status: DC
  Administered 2021-03-06 – 2021-03-30 (×23): 6 via TOPICAL

## 2021-03-06 MED ORDER — FENTANYL CITRATE PF 50 MCG/ML IJ SOSY: 25.0000 ug | PREFILLED_SYRINGE | INTRAMUSCULAR | Status: DC | PRN

## 2021-03-06 MED ORDER — HYDROMORPHONE HCL 2 MG/ML IJ SOLN
INTRAMUSCULAR | Status: AC
  Filled 2021-03-06: qty 1

## 2021-03-06 MED ORDER — MORPHINE SULFATE (PF) 2 MG/ML IV SOLN
1.0000 mg | INTRAVENOUS | Status: DC | PRN
  Administered 2021-03-06: 4 mg via INTRAVENOUS
  Administered 2021-03-06 (×2): 2 mg via INTRAVENOUS
  Administered 2021-03-06: 4 mg via INTRAVENOUS
  Administered 2021-03-08: 10:00:00 2 mg via INTRAVENOUS
  Administered 2021-03-08: 15:00:00 4 mg via INTRAVENOUS
  Administered 2021-03-08: 22:00:00 2 mg via INTRAVENOUS
  Administered 2021-03-08 – 2021-03-09 (×2): 4 mg via INTRAVENOUS
  Filled 2021-03-06 (×2): qty 2
  Filled 2021-03-06: qty 1
  Filled 2021-03-06 (×3): qty 2
  Filled 2021-03-06 (×2): qty 1
  Filled 2021-03-06: qty 2

## 2021-03-06 MED ORDER — SUGAMMADEX SODIUM 200 MG/2ML IV SOLN
INTRAVENOUS | Status: DC | PRN
  Administered 2021-03-06: 100 mg via INTRAVENOUS

## 2021-03-06 MED ORDER — ORAL CARE MOUTH RINSE
15.0000 mL | Freq: Two times a day (BID) | OROMUCOSAL | Status: DC
  Administered 2021-03-06 – 2021-03-09 (×9): 15 mL via OROMUCOSAL

## 2021-03-06 NOTE — Anesthesia Postprocedure Evaluation (Signed)
Anesthesia Post Note  Patient: Kathleen Jimenez  Procedure(s) Performed: EXPLORATORY LAPAROTOMY and graham patch repair of perforated duodenal ulcer (Abdomen)     Patient location during evaluation: PACU Anesthesia Type: General Level of consciousness: awake Pain management: pain level controlled Vital Signs Assessment: post-procedure vital signs reviewed and stable Respiratory status: spontaneous breathing and respiratory function stable Cardiovascular status: stable Postop Assessment: no apparent nausea or vomiting Anesthetic complications: no   No notable events documented.  Last Vitals:  Vitals:   03/06/21 1952 03/06/21 2000  BP:  (!) 122/36  Pulse:  62  Resp:  16  Temp: 36.8 C   SpO2:  96%    Last Pain:  Vitals:   03/06/21 2038  TempSrc:   PainSc: [place]

## 2021-03-06 NOTE — Progress Notes (Signed)
Son of Mrs. Talerico (Dr. Lenard Forth) supplied a photo copy of her DNR form to be placed into chart. I notified Dr. Ander Slade as she is currently full code. Copy of the DNR will be placed in the front of her chart.

## 2021-03-06 NOTE — Transfer of Care (Signed)
Immediate Anesthesia Transfer of Care Note  Patient: Kathleen Jimenez  Procedure(s) Performed: EXPLORATORY LAPAROTOMY and graham patch repair of perforated duodenal ulcer (Abdomen)  Patient Location: PACU  Anesthesia Type:General  Level of Consciousness: awake, pateint uncooperative and confused  Airway & Oxygen Therapy: Patient Spontanous Breathing and Patient connected to face mask oxygen  Post-op Assessment: Report given to RN, Post -op Vital signs reviewed and stable and Patient moving all extremities X 4  Post vital signs: stable  Last Vitals:  Vitals Value Taken Time  BP 134/79 03/06/21 0030  Temp    Pulse 67 03/06/21 0041  Resp 8 03/06/21 0041  SpO2 93 % 03/06/21 0041  Vitals shown include unvalidated device data.  Last Pain:  Vitals:   03/06/21 0015  TempSrc:   PainSc: 0-No pain         Complications: No notable events documented.

## 2021-03-06 NOTE — Op Note (Signed)
03/05/2021 - 03/06/2021  12:05 AM  PATIENT:  Kathleen Jimenez  87 y.o. female  PRE-OPERATIVE DIAGNOSIS:  abdominal free air  POST-OPERATIVE DIAGNOSIS:  perforated duodenal ulcer  PROCEDURE:  Procedure(s): EXPLORATORY LAPAROTOMY and graham patch repair of perforated duodenal ulcer (N/A)  SURGEON:  Surgeon(s) and Role:    * Jovita Kussmaul, MD - Primary    * Donnie Mesa, MD - Assisting  PHYSICIAN ASSISTANT:   ASSISTANTS: Dr. Georgette Dover   ANESTHESIA:   general  EBL:  100 mL   BLOOD ADMINISTERED:none  DRAINS: (1) Blake drain(s) in the space anterior to duodenum    LOCAL MEDICATIONS USED:  NONE  SPECIMEN:  No Specimen  DISPOSITION OF SPECIMEN:  N/A  COUNTS:  YES  TOURNIQUET:  * No tourniquets in log *  DICTATION: .Dragon Dictation  After informed consent was obtained the patient was brought to the operating room and placed in the supine position on the operating table.  After adequate induction of general anesthesia the patient's abdomen was prepped with ChloraPrep, allowed to dry, and draped in usual sterile manner.  An appropriate timeout was performed.  An upper midline incision was made with a 10 blade knife.  The incision was carried through the skin and subcutaneous tissue sharply with the electrocautery until the linea alba was identified.  The linea alba was incised with the electrocautery.  The preperitoneal space was probed bluntly with a hemostat until the peritoneum was opened and access was gained to the abdominal cavity.  The rest of the incision was opened under direct vision.  There was a large amount of bilious contamination in the abdomen that was evacuated.  There was generalized peritonitis noted.  The abdomen was inspected in all 4 quadrants and the only significant abnormality that we noted was a large hole in the anterior wall of the first part of the duodenum.  The liver had tried to seal the perforation.  We were able to bluntly finger dissect the liver off of  the duodenum.  We did have a small area of bleeding on the anterior wall of the duodenum that was controlled with a single 2-0 silk stitch.  I then mobilized a tongue of omentum sharply with the electrocautery and divided it between hemostats and ligated the ends with 2-0 silk ties.  Several 2-0 silk stitches were then placed across the whole from superior to inferior.  The tongue of omentum was then placed into the hole and the silk stitches were cinched down and tied.  This formed a very nice Phillip Heal patch on the duodenum.  The abdomen is then irrigated with copious amounts of saline until the effluent was clear.  A small stab incision was made in the right upper quadrant.  A tonsil clamp was placed through this opening and used to bring a 19 [place] round Blake drain into the abdomen.  The drain was placed in the space between the duodenum and liver.  The drain was anchored to the skin with a 3-0 nylon stitch.  Next the anterior abdominal wall fascia was closed with 2 running #1 double-stranded looped PDS sutures.  The subcutaneous tissue was packed with a 4 x 4 gauze moistened with saline.  Sterile dressings were applied.  The drain was placed to bulb suction and there was a good seal.  The patient tolerated the procedure well.  At the end of the case all needle sponge and instrument counts were correct.  The patient was then awakened and taken to  recovery in stable condition.  PLAN OF CARE: Admit to inpatient   PATIENT DISPOSITION:  ICU - extubated and stable.   Delay start of Pharmacological VTE agent (>24hrs) due to surgical blood loss or risk of bleeding: no

## 2021-03-06 NOTE — Progress Notes (Signed)
NAME:  Kathleen Jimenez, MRN:  [id number], DOB:  1931/11/12, LOS: 1 ADMISSION DATE:  03/05/2021, CONSULTATION DATE:  03/05/21 REFERRING MD:  Surgery, CHIEF COMPLAINT:  post op perforated viscus   History of Present Illness:    Patient is a very frail 87 year old Caucasian female past medical history of hypertension carotid stenosis anemia presented with 1 day history of abdominal pain she had a CAT scan with perforated viscus was taken to the OR for gastric repair of perforated duodenal ulcer washout of the abdominal cavity intra-abdominal drain Patient was extubated in PACU no hemodynamic support he was given antibiotic she has chronic renal insufficiency .  Pertinent  Medical History   Past Medical History:  Diagnosis Date   Anemia    Carotid stenosis, left    Chronic kidney disease    Hypertension    Significant Hospital Events: Including procedures, antibiotic start and stop dates in addition to other pertinent events   12/16  s/p exploratory laparotomy for perforated duodenal ulcer  Interim History / Subjective:  Denies any significant complaints at present Feels she may tolerate something orally  Objective   Blood pressure (!) 141/48, pulse (!) 57, temperature (!) 97.5 F (36.4 C), temperature source Oral, resp. rate (!) 9, height 5\' 3"  (1.6 m), weight 48.1 kg, SpO2 100 %.        Intake/Output Summary (Last 24 hours) at 03/06/2021 1057 Last data filed at 03/06/2021 0800 Gross per 24 hour  Intake 1976.27 ml  Output 550 ml  Net 1426.27 ml   Filed Weights   03/05/21 1942 03/06/21 0131  Weight: 52.2 kg 48.1 kg    Examination: General: Frail, denies significant pain, admits to some abdominal discomfort HENT: Moist oral mucosa Lungs: Clear breath sounds Cardiovascular: S1-S2 appreciated Abdomen: Postop changes Extremities: No clubbing, no edema Neuro: Awake alert and oriented x3 GU: [facility] Problem list     Assessment & Plan:  Perforated  duodenal ulcer s/p exploratory laparotomy  Stable hemodynamics  Continue Zosyn Continue PPI  Pain management  Continue IV fluids  Stage IV chronic kidney disease -Avoid nephrotoxic's -Maintain renal perfusion  Leukocytosis -Related to stress relating to surgery and perforated viscus viscus -Monitor  Best Practice (right click and "Reselect all SmartList Selections" daily)   Diet/type: NPO DVT prophylaxis: other GI prophylaxis: PPI Lines: N/A Foley:  N/A Code Status:  full code Last date of multidisciplinary goals of care discussion [per primary]  Labs   CBC: Recent Labs  Lab 03/05/21 1949 03/05/21 2012 03/06/21 0149 03/06/21 0801  WBC 16.2*  --  14.2* 14.5*  NEUTROABS 15.2*  --   --   --   HGB 9.6* 9.5* 8.9* 8.2*  HCT 28.9* 28.0* 27.2* 25.2*  MCV 99.3  --  97.8 98.8  PLT 338  --  330 258    Basic Metabolic Panel: Recent Labs  Lab 03/05/21 1949 03/05/21 2012 03/06/21 0149 03/06/21 0801  NA 131* 133* 131* 131*  K 4.6 4.5 4.9 5.4*  CL 104 102 104 104  CO2 22  --  21* 22  GLUCOSE 172* 166* 132* 114*  BUN 45* 41* 42* 45*  CREATININE 2.08* 1.90* 1.95* 2.02*  CALCIUM 8.4*  --  7.8* 7.9*   GFR: Estimated Creatinine Clearance: 14.9 mL/min (A) (by C-G formula based on SCr of 2.02 mg/dL (H)). Recent Labs  Lab 03/05/21 1949 03/06/21 0149 03/06/21 0801  WBC 16.2* 14.2* 14.5*  LATICACIDVEN 1.1 1.8  --     Liver  Function Tests: Recent Labs  Lab 03/05/21 1949 03/06/21 0149  AST 46* 56*  ALT 50* 50*  ALKPHOS 65 50  BILITOT 0.6 0.8  PROT 5.8* 5.0*  ALBUMIN 3.0* 2.8*   Recent Labs  Lab 03/05/21 1949  LIPASE 108*   No results for input(s): AMMONIA in the last 168 hours.  ABG    Component Value Date/Time   TCO2 22 03/05/2021 2012     Coagulation Profile: No results for input(s): INR, PROTIME in the last 168 hours.  Cardiac Enzymes: No results for input(s): CKTOTAL, CKMB, CKMBINDEX, TROPONINI in the last 168 hours.  HbA1C: No results  found for: HGBA1C  CBG: No results for input(s): GLUCAP in the last 168 hours.  Review of Systems:   Abdominal discomfort  Past Medical History:  She,  has a past medical history of Anemia, Carotid stenosis, left, Chronic kidney disease, and Hypertension.   Surgical History:   Past Surgical History:  Procedure Laterality Date   ENDARTERECTOMY Left 05/19/2017   Procedure: LEFT CAROTID ENDARTERECTOMY CAROTID;  Surgeon: [name], MD;  Location: [place];  Service: Vascular;  Laterality: Left;   FEMUR IM NAIL Left 12/28/2016   Procedure: INTRAMEDULLARY (IM) NAIL INTERTROCHANTERIC LEFT;  Surgeon: Gaynelle Arabian, MD;  Location: [facility];  Service: Orthopedics;  Laterality: Left;   JOINT REPLACEMENT  approx 2012   R orif   LUMBAR FUSION  approx. 2003   PATCH ANGIOPLASTY Left 05/19/2017   Procedure: PATCH ANGIOPLASTY OF LEFT CAROTID ARTERY USING HEMASHIELD PLATIUM FINESSE PATCH;  Surgeon: [name], MD;  Location: [facility];  Service: Vascular;  Laterality: Left;   THYROID SURGERY  approx. 1960   nodule removed     Social History:   reports that she has never smoked. She has never used smokeless tobacco. She reports that she does not drink alcohol and does not use drugs.   Family History:  Her family history includes Congestive Heart Failure in her mother; Heart disease in her mother; Stroke in her father.   Allergies No Known Allergies    Sherrilyn Rist, MD [place] [facility] Pager: See Shea Evans

## 2021-03-06 NOTE — TOC Progression Note (Signed)
Transition of Care [facility]) - Progression Note    Patient Details  Name: Kathleen Jimenez MRN: [id number] Date of Birth: 1932-02-07  [facility] CM/SW Contact  Purcell Mouton, RN Phone Number: 03/06/2021, 3:21 PM  Clinical Narrative:    TOC will continue to follow for discharge needs.    Expected Discharge Plan: [place] Barriers to Discharge: No Barriers Identified  Expected Discharge Plan and Services Expected Discharge Plan: [place]                                               Social Determinants of Health (SDOH) Interventions    Readmission Risk Interventions No flowsheet data found.

## 2021-03-06 NOTE — Progress Notes (Signed)
Pharmacy Antibiotic Note  Kathleen Jimenez is a 87 y.o. female admitted on 03/05/2021 with  intra-abdominal infection .  Pharmacy has been consulted for Zosyn dosing. Scr 1.9- this appears to be patient's baseline.  Plan: Zosyn 2.25gm IV q6h Monitor renal function and cx data   Height: 5\' 3"  (160 cm) Weight: 48.1 kg (106 lb 0.7 oz) IBW/kg (Calculated) : 52.4  Temp (24hrs), Avg:98 F (36.7 C), Min:97.8 F (36.6 C), Max:98.2 F (36.8 C)  Recent Labs  Lab 03/05/21 1949 03/05/21 2012  WBC 16.2*  --   CREATININE 2.08* 1.90*  LATICACIDVEN 1.1  --     Estimated Creatinine Clearance: 15.8 mL/min (A) (by C-G formula based on SCr of 1.9 mg/dL (H)).    No Known Allergies  Antimicrobials this admission: 12/17 Zosyn >>   Dose adjustments this admission:  Microbiology results: 12/17 BCx:   12/17 MRSA PCR:   Thank you for allowing pharmacy to be a part of this patients care.  Netta Cedars PharmD 03/06/2021 2:09 AM

## 2021-03-06 NOTE — Progress Notes (Signed)
eLink Physician-Brief Progress Note Patient Name: Kathleen Jimenez DOB: 09-05-1931 MRN: [id number]   Date of Service  03/06/2021  HPI/Events of Note  Agitated and trying to get OOB  eICU Interventions  Added precedex     Intervention Category Intermediate Interventions: Change in mental status - evaluation and management  Tilden Dome 03/06/2021, 11:33 PM

## 2021-03-06 NOTE — Progress Notes (Signed)
eLink Physician-Brief Progress Note Patient Name: Kathleen Jimenez DOB: 1931/05/01 MRN: [id number]   Date of Service  03/06/2021  HPI/Events of Note    eICU Interventions  NG placement verified by XR abd     Intervention Category Intermediate Interventions: Diagnostic test evaluation  Tirth Cothron V. Briggitte Boline 03/06/2021, 5:54 AM

## 2021-03-06 NOTE — Progress Notes (Signed)
Patient removed her NGT tube and said its bothering her and would like to sleep. Said to patient I need to place it back but she insist that she wanted to sleep first. Surgery & Elink was notified and will continue to monitor patient.

## 2021-03-06 NOTE — Progress Notes (Signed)
eLink Physician-Brief Progress Note Patient Name: CHARMINE BOCKRATH DOB: 1931-08-13 MRN: [id number]   Date of Service  03/06/2021  HPI/Events of Note  Perf gastric ulcer Extubated in PACU AKI  eICU Interventions  No resp distress, labs ok     Intervention Category Evaluation Type: New Patient Evaluation  Aura Bibby V. Kristianna Saperstein 03/06/2021, 1:40 AM

## 2021-03-06 NOTE — Progress Notes (Signed)
RN spoke with Dr Marlou Starks and MD ordered to reinsert NGT. NGT reinserted and waiting for Xray verification.

## 2021-03-06 NOTE — Progress Notes (Signed)
1 Day Post-Op   Subjective/Chief Complaint: Feels better. Less pain. Just wants to be left alone to sleep   Objective: Vital signs in last 24 hours: Temp:  [97.8 F (36.6 C)-98.2 F (36.8 C)] 98.2 F (36.8 C) (12/17 0400) Pulse Rate:  [52-84] 84 (12/17 0600) Resp:  [8-20] 17 (12/17 0600) BP: (121-157)/(39-128) 136/50 (12/17 0600) SpO2:  [87 %-99 %] 99 % (12/17 0600) Weight:  [48.1 kg-52.2 kg] 48.1 kg (12/17 0131) Last BM Date: 03/05/21 (as per patient)  Intake/Output from previous day: 12/16 0701 - 12/17 0700 In: 1976.3 [I.V.:1677.7; IV Piggyback:298.6] Out: 450 [Urine:270; Drains:80; Blood:100] Intake/Output this shift: No intake/output data recorded.  General appearance: alert and cachectic Resp: clear to auscultation bilaterally Cardio: regular rate and rhythm GI: appropriately tender. Did not remove dressing  Lab Results:  Recent Labs    03/05/21 1949 03/05/21 2012 03/06/21 0149  WBC 16.2*  --  14.2*  HGB 9.6* 9.5* 8.9*  HCT 28.9* 28.0* 27.2*  PLT 338  --  330   BMET Recent Labs    03/05/21 1949 03/05/21 2012 03/06/21 0149  NA 131* 133* 131*  K 4.6 4.5 4.9  CL 104 102 104  CO2 22  --  21*  GLUCOSE 172* 166* 132*  BUN 45* 41* 42*  CREATININE 2.08* 1.90* 1.95*  CALCIUM 8.4*  --  7.8*   PT/INR No results for input(s): LABPROT, INR in the last 72 hours. ABG No results for input(s): PHART, HCO3 in the last 72 hours.  Invalid input(s): PCO2, PO2  Studies/Results: DG Abd 1 View  Result Date: 03/06/2021 CLINICAL DATA:  87 year old female status post nasogastric tube placement. EXAM: ABDOMEN - 1 VIEW COMPARISON:  Abdominal radiograph 03/05/2021. FINDINGS: Nasogastric tube noted coiled in the stomach, with tip in the proximal aspect of the stomach. Several borderline dilated loops of small bowel are noted in the right lower quadrant of the abdomen measuring up to 2.9 cm in diameter. Some gas and stool is also noted throughout the colon. No gross  pneumoperitoneum confidently identified on this single view examination. Rod and screw fixation hardware throughout the lumbosacral spine. IMPRESSION: 1. Nasogastric tube is present with tip coiled in the proximal stomach. 2. Nonspecific, nonobstructive bowel gas pattern, as above. No pneumoperitoneum. Electronically Signed   By: Vinnie Langton M.D.   On: 03/06/2021 05:29   DG CHEST PORT 1 VIEW  Result Date: 03/06/2021 CLINICAL DATA:  Acute respiratory failure EXAM: PORTABLE CHEST 1 VIEW COMPARISON:  12/26/2016 FINDINGS: Interval development of a tiny left pleural effusion. Lungs are clear. No pneumothorax. Asymmetric enlargement of the right hilum is accentuated by mild rotation on this examination and likely reflects stable enlargement of the central pulmonary arteries better appreciated on CT examination of 03/05/2021. Pulmonary vascularity is normal. Nasogastric tube extends into the upper abdomen beyond the margin of the examination. IMPRESSION: Nasogastric tube in expected position. Interval development of a tiny left pleural effusion. Electronically Signed   By: Fidela Salisbury M.D.   On: 03/06/2021 01:38   DG Abdomen Acute W/Chest  Result Date: 03/05/2021 CLINICAL DATA:  Abdomen pain with nausea vomiting EXAM: DG ABDOMEN ACUTE WITH 1 VIEW CHEST COMPARISON:  12/26/2016 FINDINGS: Single-view chest demonstrates clear left lung field. Nodular opacity in the right upper lobe with vague right suprahilar opacity. Asymmetric density in the right hilus. Normal cardiac size with aortic atherosclerosis. Supine and upright views of the abdomen demonstrate a nonobstructed gas pattern with moderate stool. Surgical hardware in the lumbosacral spine. Upright  view demonstrates air-fluid level over the liver, raising concern for free air. IMPRESSION: 1. Right apical nodularity, possibly due to lung nodule with additional vague airspace opacity in the right suprahilar lung and asymmetrically dense right hilus. Chest  CT recommended for further evaluation. 2. Findings suspicious for air-fluid level over the liver, raising concern for free air. CT abdomen and pelvis is recommended for further evaluation. Critical Value/emergent results were called by telephone at the time of interpretation on 03/05/2021 at 8:44 pm to provider Davonna Belling , who verbally acknowledged these results. Electronically Signed   By: Donavan Foil M.D.   On: 03/05/2021 20:44   CT CHEST ABDOMEN PELVIS WO CONTRAST  Result Date: 03/05/2021 CLINICAL DATA:  Abdominal pain, with suspected lung nodule on chest x-ray today. EXAM: CT CHEST, ABDOMEN AND PELVIS WITHOUT CONTRAST TECHNIQUE: Multidetector CT imaging of the chest, abdomen and pelvis was performed following the standard protocol without IV contrast. COMPARISON:  Abdomen series with PA chest performed today, portable chest 12/26/2016. FINDINGS: CT CHEST FINDINGS Cardiovascular: The aorta and coronary arteries are heavily calcified with calcification extending into the aortic branch arteries. There is a small anterior pericardial effusion. Borderline aneurysmal ascending aorta 3.9 cm, otherwise normal caliber aorta. Normal caliber central pulmonary arteries and veins noted with mild cardiomegaly. Mediastinum/Nodes: Heterogeneous mass with patchy calcifications replaces the right lobe of the thyroid gland and measures 4.0 x 3.7 x 5.3 cm. Left lobe is unremarkable. This displaces the trachea left of the midline without significant tracheal narrowing. There are calcified right hilar lymph nodes. No noncalcified thoracic or axillary adenopathy is seen without contrast. There is a paucity of body fat which may suggest cachexia. Small hiatal hernia. Lungs/Pleura: There are trace pleural effusions, with small calcified pleural plaquing in the posterior basal left chest with no other focal pleural abnormality or pneumothorax. There is a 5 mm subpleural noncalcified right middle lobe nodule laterally on  series 6 axial 92. the lungs are slightly emphysematous without appreciable infiltrates. There is calcified material filling a vertically directed right upper lobe suprahilar subsegmental bronchus as well as small branching calcifications more laterally in the right upper lobe, both findings consistent with calcified endobronchial inspissated secretions. In the apicolateral right upper lobe just above this level, there are nodular and branching opacities compatible with bronchiolar mucous plugging. There is faint hazy interstitial change seen just above this in the peripheral right upper lobe which could be due to chronic small airways disease or pneumonitis. In the right middle lobe there similar branching opacities laterally consistent with additional bronchiolar mucous plugging. This was not seen on the 2018 chest x-ray. Remainder of the lungs are clear. 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 There is no worrisome regional skeletal lesion. Mild elevation right hemidiaphragm. CT ABDOMEN PELVIS FINDINGS Hepatobiliary: There are few scattered calcified granulomas. 1.3 cm cyst of 8.7 Hounsfield units in the left lobe lateral segment with no other focal abnormality. The gallbladder is distended and contains at least 1 calcified stone proximally, measuring 1.8 cm. No biliary dilatation is seen. There is a mildly thickened gallbladder wall which could be reactive due to free fluid. Pancreas: Not well seen due to artifact related to spinal fusion hardware. No obvious mass or ductal dilatation.  Spleen: Normal in size with scattered calcified granulomas. Adrenals/Urinary Tract: 3.6 cm cyst in the left upper pole  measuring 1.3 Hounsfield units. Additional few bilateral small cysts, including a hyperdense 1 cm cyst in the inferior pole of the right kidney of 94 Hounsfield units. No stone is seen , no hydroureteronephrosis or other focal abnormality. Normal bladder thickness, with prolapse of the bladder base two 5 cm below the urogenital diaphragm protruding inferiorly to just below the level of the vagina. Stomach/Bowel: There is no small bowel obstruction. Fold thickening is seen in the stomach and small bowel, no thickening of the wall the colon is seen, with advanced sigmoid diverticulosis noted. An appendix is not seen in this patient. The small bowel thickening could indicate nonspecific enteritis, congestive edema, or could be reactive from the presence of mild/moderate abdominopelvic free fluid. Vascular/Lymphatic: No adenopathy is seen. There is extensive aortoiliac/iliofemoral and visceral arterial calcific plaque. Maximum aortic diameter 2.3 cm Reproductive: The uterus is grossly intact but not well seen due to artifact from bilateral hip nailing hardware. No adnexal mass is evident. Other: There is moderate free air collecting in the upper abdomen around the liver and deep to the anterior wall to just below the level of the umbilicus. In the absence of recent laparoscopic procedure this would be consistent with a perforated hollow viscus although the site of perforation is not identified on this noncontrasted exam. No free air is seen however, in the pelvis which would be suspicious for a sigmoid perforation. There is mild body wall anasarca. Generalized mesenteric edema. Musculoskeletal: There is osteopenia, with posterior fusion hardware from L2-S1, evidence of at least mild loosening of the sacral bolts but no other hardware loosening suspected. There is posterior bone grafting at the levels of lumbar fusion. Grade 1 L5-S1 spondylolisthesis with severe acquired spinal canal stenosis at this level. There is  osteopenia with degenerative changes of the spine and hips and old hip nailing hardware without apparent loosening. No worrisome bone lesion. IMPRESSION: 1. Moderate free air with mild-to-moderate free fluid in the abdomen. Findings consistent with perforated hollow viscus with the site of perforation not identified, but probably not in the pelvis. 2. Thickened small bowel segments potentially reactive due to the free fluid/leaked intestinal contents, versus other enteritis. 3. Generalized mesenteric edema/peritonitis. 4. Prolapses bladder protruding through the vagina. The ureteral insertions are not well evaluated due to artifact from bilateral hip nailing but are suspected to be at the level of the pelvic floor. There is no upstream hydronephrosis. 5. Aortic and coronary artery atherosclerosis with mild cardiomegaly, small pericardial effusion. 6. Branching/nodular opacities in the right upper lobe laterally with calcified inspissated endobronchial material in the right upper lobe in 2 locations. The noncalcified branching opacities are consistent with bronchiolar mucous plugging which is also seen to a lesser extent in a portion of the lateral right middle lobe. Additional findings of old granulomatous disease. Mild COPD. 7. 5.2 cm right thyroid mass with patchy calcifications displacing the trachea to the left. Ultrasound follow-up recommended when clinically feasible. 8. Small hiatal hernia.  No incarcerated hernias. 9. Generalized paucity of body fat which may be seen with cachexia, with mild body wall anasarca. 10. Cholelithiasis and mild gallbladder thickening, which could be from cholecystitis or reactive due to the adjacent fluid. 11. Constipation with sigmoid diverticulosis. 12. Discussed over the phone with Dr. Alvino Chapel at 9:04 p.m., 03/05/2021. Electronically Signed   By: Telford Nab M.D.   On: 03/05/2021 21:42    Anti-infectives:  Anti-infectives (From admission, onward)    Start     Dose/Rate  Route Frequency Ordered Stop   03/06/21 0400  piperacillin-tazobactam (ZOSYN) IVPB 2.25 g        2.25 g 100 mL/hr over 30 Minutes Intravenous Every 6 hours 03/06/21 0216     03/05/21 2115  piperacillin-tazobactam (ZOSYN) IVPB 3.375 g        3.375 g 100 mL/hr over 30 Minutes Intravenous  Once 03/05/21 2101 03/05/21 2203       Assessment/Plan: s/p Procedure(s): EXPLORATORY LAPAROTOMY and graham patch repair of perforated duodenal ulcer (N/A) Continue ng and bowel rest POD 1 from graham patch repair of perforated DU Continue IV zosyn Monitor in icu  LOS: 1 day    Autumn Messing III 03/06/2021

## 2021-03-06 NOTE — Consult Note (Signed)
NAME:  Kathleen Jimenez, MRN:  [id number], DOB:  1931-11-17, LOS: 1 ADMISSION DATE:  03/05/2021, CONSULTATION DATE: March 06, 2021 REFERRING MD: Surgery, CHIEF COMPLAINT: Perforated viscus postop  History of Present Illness:    Patient is a very frail 87 year old Caucasian female past medical history of hypertension carotid stenosis anemia presented with 1 day history of abdominal pain she had a CAT scan with perforated viscus was taken to the OR for gastric repair of perforated duodenal ulcer washout of the abdominal cavity intra-abdominal drain Patient was extubated in PACU no hemodynamic support he was given antibiotic she has chronic renal insufficiency .  Objective   Blood pressure 123/60, pulse 70, temperature 98.2 F (36.8 C), resp. rate 16, height 5\' 3"  (1.6 m), weight 52.2 kg, SpO2 (!) 87 %.        Intake/Output Summary (Last 24 hours) at 03/06/2021 0118 Last data filed at 03/06/2021 0023 Gross per 24 hour  Intake 1650 ml  Output 250 ml  Net 1400 ml   Filed Weights   03/05/21 1942  Weight: 52.2 kg    Examination: General: Appropriate postop exam awake frail mild acute pain Neuro:  WNL , AOX3 , EOMI , CN II-XII intact , UL , LL strength is symmetrical and 5/5 HEENT:  atraumatic , no jaundice , dry mucous membranes  Cardiovascular:  Irregular irregular , ESM 2/6 in the aortic area  Lungs:  CTA bilateral , no wheezing or crackles  Abdomen: Abdominal scar with no evidence of bleeding dressing is in place musculoskeletal:  WNL , normal pulses  Skin:  No rash      Assessment & Plan:  Perforated gastric ulcer status postrepair intra-abdominal drain washout of intra-abdominal material extubated in the OR  --Continue with antibiotic  --Slow IV fluid hydration  --Delay Lovenox  -- PPI ordered  --Antibiotics were ordered by surgery.    Labs   CBC: Recent Labs  Lab 03/05/21 1949 03/05/21 2012  WBC 16.2*  --   NEUTROABS 15.2*  --   HGB 9.6* 9.5*  HCT 28.9*  28.0*  MCV 99.3  --   PLT 338  --     Basic Metabolic Panel: Recent Labs  Lab 03/05/21 1949 03/05/21 2012  NA 131* 133*  K 4.6 4.5  CL 104 102  CO2 22  --   GLUCOSE 172* 166*  BUN 45* 41*  CREATININE 2.08* 1.90*  CALCIUM 8.4*  --    GFR: Estimated Creatinine Clearance: 17.2 mL/min (A) (by C-G formula based on SCr of 1.9 mg/dL (H)). Recent Labs  Lab 03/05/21 1949  WBC 16.2*  LATICACIDVEN 1.1    Liver Function Tests: Recent Labs  Lab 03/05/21 1949  AST 46*  ALT 50*  ALKPHOS 65  BILITOT 0.6  PROT 5.8*  ALBUMIN 3.0*   Recent Labs  Lab 03/05/21 1949  LIPASE 108*   No results for input(s): AMMONIA in the last 168 hours.  ABG    Component Value Date/Time   TCO2 22 03/05/2021 2012     Coagulation Profile: No results for input(s): INR, PROTIME in the last 168 hours.  Cardiac Enzymes: No results for input(s): CKTOTAL, CKMB, CKMBINDEX, TROPONINI in the last 168 hours.  HbA1C: No results found for: HGBA1C  CBG: No results for input(s): GLUCAP in the last 168 hours.  Review of Systems:   Unable to obtain due to patient condition  Past Medical History:  She,  has a past medical history of Anemia, Carotid stenosis, left, Chronic  kidney disease, and Hypertension.   Surgical History:   Past Surgical History:  Procedure Laterality Date   ENDARTERECTOMY Left 05/19/2017   Procedure: LEFT CAROTID ENDARTERECTOMY CAROTID;  Surgeon: [name], MD;  Location: [facility];  Service: Vascular;  Laterality: Left;   FEMUR IM NAIL Left 12/28/2016   Procedure: INTRAMEDULLARY (IM) NAIL INTERTROCHANTERIC LEFT;  Surgeon: Gaynelle Arabian, MD;  Location: [facility];  Service: Orthopedics;  Laterality: Left;   JOINT REPLACEMENT  approx 2012   R orif   LUMBAR FUSION  approx. 2003   PATCH ANGIOPLASTY Left 05/19/2017   Procedure: PATCH ANGIOPLASTY OF LEFT CAROTID ARTERY USING HEMASHIELD PLATIUM FINESSE PATCH;  Surgeon: [name], MD;  Location: [facility];  Service: Vascular;   Laterality: Left;   THYROID SURGERY  approx. 1960   nodule removed     Social History:   reports that she has never smoked. She has never used smokeless tobacco. She reports that she does not drink alcohol and does not use drugs.   Family History:  Her family history includes Congestive Heart Failure in her mother; Heart disease in her mother; Stroke in her father.   Allergies No Known Allergies   Home Medications  Prior to Admission medications   Medication Sig Start Date End Date Taking? Authorizing Provider  acetaminophen (TYLENOL) 500 MG tablet Take 500 mg by mouth every 6 (six) hours as needed for moderate pain.   Yes [provider]  amLODipine (NORVASC) 5 MG tablet Take 5 mg by mouth daily.   Yes [provider]  aspirin EC 81 MG tablet Take 81 mg by mouth daily. Swallow whole.   Yes [provider]  calcium carbonate (TUMS - DOSED IN MG ELEMENTAL CALCIUM) 500 MG chewable tablet Chew 500 mg by mouth daily.   Yes [provider]  carvedilol (COREG) 6.25 MG tablet Take 1 tablet (6.25 mg total) by mouth 2 (two) times daily with a meal. 01/01/17  Yes Thurnell Lose, MD  Cholecalciferol (VITAMIN D) 2000 units tablet Take 2,000 Units by mouth daily.   Yes [provider]  hydrALAZINE (APRESOLINE) 25 MG tablet Take 1 tablet (25 mg total) by mouth every 8 (eight) hours. Patient taking differently: Take 25 mg by mouth 2 (two) times daily. 01/01/17  Yes Thurnell Lose, MD  Multiple Vitamin (MULTIVITAMIN WITH MINERALS) TABS tablet Take 1 tablet by mouth daily. Centrum   Yes [provider]  rosuvastatin (CRESTOR) 10 MG tablet Take 10 mg by mouth daily.   Yes [provider]  amLODipine (NORVASC) 10 MG tablet Take 1 tablet (10 mg total) by mouth daily. Patient not taking: Reported on 03/05/2021 01/01/17   Thurnell Lose, MD  amLODipine (NORVASC) 5 MG tablet TAKE 1 TABLET BY MOUTH ONCE A DAY Patient not taking: Reported  on 03/05/2021 12/05/19 12/04/20  Ginger Organ., MD

## 2021-03-07 ENCOUNTER — Encounter (HOSPITAL_COMMUNITY): Payer: Self-pay | Admitting: General Surgery

## 2021-03-07 DIAGNOSIS — K265 Chronic or unspecified duodenal ulcer with perforation: Secondary | ICD-10-CM | POA: Diagnosis not present

## 2021-03-07 LAB — BASIC METABOLIC PANEL
Anion gap: 8 (ref 5–15)
BUN: 45 mg/dL — ABNORMAL HIGH (ref 8–23)
CO2: 19 mmol/L — ABNORMAL LOW (ref 22–32)
Calcium: 7.8 mg/dL — ABNORMAL LOW (ref 8.9–10.3)
Chloride: 107 mmol/L (ref 98–111)
Creatinine, Ser: 2.31 mg/dL — ABNORMAL HIGH (ref 0.44–1.00)
GFR, Estimated: 20 mL/min — ABNORMAL LOW (ref >60)
Glucose, Bld: 72 mg/dL (ref 70–99)
Potassium: 4.8 mmol/L (ref 3.5–5.1)
Sodium: 134 mmol/L — ABNORMAL LOW (ref 135–145)

## 2021-03-07 LAB — CBC
HCT: 22.2 % — ABNORMAL LOW (ref 36.0–46.0)
Hemoglobin: 6.8 g/dL — CL (ref 12.0–15.0)
MCH: 32.1 pg (ref 26.0–34.0)
MCHC: 30.6 g/dL (ref 30.0–36.0)
MCV: 104.7 fL — ABNORMAL HIGH (ref 80.0–100.0)
Platelets: 191 10*3/uL (ref 150–400)
RBC: 2.12 MIL/uL — ABNORMAL LOW (ref 3.87–5.11)
RDW: 12.7 % (ref 11.5–15.5)
WBC: 15.2 10*3/uL — ABNORMAL HIGH (ref 4.0–10.5)
nRBC: 0 % (ref 0.0–0.2)

## 2021-03-07 LAB — HEMOGLOBIN AND HEMATOCRIT, BLOOD
HCT: 27.3 % — ABNORMAL LOW (ref 36.0–46.0)
Hemoglobin: 9 g/dL — ABNORMAL LOW (ref 12.0–15.0)

## 2021-03-07 LAB — MAGNESIUM: Magnesium: 2 mg/dL (ref 1.7–2.4)

## 2021-03-07 LAB — PREPARE RBC (CROSSMATCH)

## 2021-03-07 MED ORDER — LIP MEDEX EX OINT
TOPICAL_OINTMENT | CUTANEOUS | Status: DC | PRN
  Administered 2021-03-07: 75 via TOPICAL
  Filled 2021-03-07 (×2): qty 7

## 2021-03-07 MED ORDER — PANTOPRAZOLE 80MG IVPB - SIMPLE MED
80.0000 mg | Freq: Every day | INTRAVENOUS | Status: DC
  Administered 2021-03-07 – 2021-03-09 (×3): 80 mg via INTRAVENOUS
  Filled 2021-03-07 (×3): qty 80

## 2021-03-07 MED ORDER — SODIUM CHLORIDE 0.9% IV SOLUTION: Freq: Once | INTRAVENOUS | Status: AC

## 2021-03-07 MED ORDER — SODIUM CHLORIDE 0.9% IV SOLUTION: Freq: Once | INTRAVENOUS | Status: DC

## 2021-03-07 MED ORDER — LACTATED RINGERS IV BOLUS
250.0000 mL | Freq: Once | INTRAVENOUS | Status: AC
  Administered 2021-03-07: 08:00:00 250 mL via INTRAVENOUS

## 2021-03-07 NOTE — Progress Notes (Signed)
Patient awake, refusing care and agitated. Refused to be touched, bath and dressing change.

## 2021-03-07 NOTE — Progress Notes (Signed)
NAME:  Kathleen Jimenez, MRN:  [id number], DOB:  05-Jan-1932, LOS: 2 ADMISSION DATE:  03/05/2021, CONSULTATION DATE:  03/05/21 REFERRING MD:  Surgery, CHIEF COMPLAINT:  post op perforated viscus   History of Present Illness:    Patient is a very frail 87 year old Caucasian female past medical history of hypertension carotid stenosis anemia presented with 1 day history of abdominal pain she had a CAT scan with perforated viscus was taken to the OR for gastric repair of perforated duodenal ulcer washout of the abdominal cavity intra-abdominal drain Patient was extubated in PACU no hemodynamic support he was given antibiotic she has chronic renal insufficiency .  Pertinent  Medical History   Past Medical History:  Diagnosis Date   Anemia    Carotid stenosis, left    Chronic kidney disease    Hypertension    Significant Hospital Events: Including procedures, antibiotic start and stop dates in addition to other pertinent events   12/16  s/p exploratory laparotomy for perforated duodenal ulcer  Interim History / Subjective:  Sleepy this morning Agitated overnight and required Precedex  Objective   Blood pressure (!) 86/39, pulse 66, temperature 97.9 F (36.6 C), temperature source Axillary, resp. rate 15, height 5\' 3"  (1.6 m), weight 48.1 kg, SpO2 91 %.        Intake/Output Summary (Last 24 hours) at 03/07/2021 0847 Last data filed at 03/07/2021 0640 Gross per 24 hour  Intake 1976.59 ml  Output 1005 ml  Net 971.59 ml   Filed Weights   03/05/21 1942 03/06/21 0131  Weight: 52.2 kg 48.1 kg    Examination: General: Frail, arousable HENT: Dry oral mucosa Lungs: Clear breath sounds Cardiovascular: S1-S2 appreciated Abdomen: Postop changes Extremities: No clubbing, no edema Neuro: Awake alert and oriented x3 GU: Fair  H&H drop noted  Resolved Hospital Problem list     Assessment & Plan:   Perforated duodenal ulcer s/p exploratory laparotomy -On Zosyn -On  PPI  Hypotension Anemia -Transfuse 1 unit packed red blood cells  Pain management  IV fluids  Stage IV chronic kidney disease -Avoid nephrotoxic medications -Maintain renal perfusion  Leukocytosis related to recent surgical stress and perforated viscus  Wean off Precedex  Best Practice (right click and "Reselect all SmartList Selections" daily)   Diet/type: NPO DVT prophylaxis: other GI prophylaxis: PPI Lines: N/A Foley:  N/A Code Status:  full code Last date of multidisciplinary goals of care discussion [per primary]  Labs   CBC: Recent Labs  Lab 03/05/21 1949 03/05/21 2012 03/06/21 0149 03/06/21 0801  WBC 16.2*  --  14.2* 14.5*  NEUTROABS 15.2*  --   --   --   HGB 9.6* 9.5* 8.9* 8.2*  HCT 28.9* 28.0* 27.2* 25.2*  MCV 99.3  --  97.8 98.8  PLT 338  --  330 169    Basic Metabolic Panel: Recent Labs  Lab 03/05/21 1949 03/05/21 2012 03/06/21 0149 03/06/21 0801  NA 131* 133* 131* 131*  K 4.6 4.5 4.9 5.4*  CL 104 102 104 104  CO2 22  --  21* 22  GLUCOSE 172* 166* 132* 114*  BUN 45* 41* 42* 45*  CREATININE 2.08* 1.90* 1.95* 2.02*  CALCIUM 8.4*  --  7.8* 7.9*   GFR: Estimated Creatinine Clearance: 14.9 mL/min (A) (by C-G formula based on SCr of 2.02 mg/dL (H)). Recent Labs  Lab 03/05/21 1949 03/06/21 0149 03/06/21 0801  WBC 16.2* 14.2* 14.5*  LATICACIDVEN 1.1 1.8  --     Liver Function Tests:  Recent Labs  Lab 03/05/21 1949 03/06/21 0149  AST 46* 56*  ALT 50* 50*  ALKPHOS 65 50  BILITOT 0.6 0.8  PROT 5.8* 5.0*  ALBUMIN 3.0* 2.8*   Recent Labs  Lab 03/05/21 1949  LIPASE 108*   No results for input(s): AMMONIA in the last 168 hours.  ABG    Component Value Date/Time   TCO2 22 03/05/2021 2012     Coagulation Profile: No results for input(s): INR, PROTIME in the last 168 hours.  Cardiac Enzymes: No results for input(s): CKTOTAL, CKMB, CKMBINDEX, TROPONINI in the last 168 hours.  HbA1C: No results found for: HGBA1C  CBG: No  results for input(s): GLUCAP in the last 168 hours.  Review of Systems:   Abdominal discomfort  Past Medical History:  She,  has a past medical history of Anemia, Carotid stenosis, left, Chronic kidney disease, and Hypertension.   Surgical History:   Past Surgical History:  Procedure Laterality Date   ENDARTERECTOMY Left 05/19/2017   Procedure: LEFT CAROTID ENDARTERECTOMY CAROTID;  Surgeon: [name], MD;  Location: [facility];  Service: Vascular;  Laterality: Left;   FEMUR IM NAIL Left 12/28/2016   Procedure: INTRAMEDULLARY (IM) NAIL INTERTROCHANTERIC LEFT;  Surgeon: Gaynelle Arabian, MD;  Location: [facility];  Service: Orthopedics;  Laterality: Left;   JOINT REPLACEMENT  approx 2012   R orif   LAPAROTOMY N/A 03/05/2021   Procedure: EXPLORATORY LAPAROTOMY and graham patch repair of perforated duodenal ulcer;  Surgeon: Jovita Kussmaul, MD;  Location: [facility];  Service: General;  Laterality: N/A;   LUMBAR FUSION  approx. 2003   PATCH ANGIOPLASTY Left 05/19/2017   Procedure: PATCH ANGIOPLASTY OF LEFT CAROTID ARTERY USING HEMASHIELD PLATIUM FINESSE PATCH;  Surgeon: [name], MD;  Location: [facility];  Service: Vascular;  Laterality: Left;   THYROID SURGERY  approx. 1960   nodule removed     Social History:   reports that she has never smoked. She has never used smokeless tobacco. She reports that she does not drink alcohol and does not use drugs.   Family History:  Her family history includes Congestive Heart Failure in her mother; Heart disease in her mother; Stroke in her father.   Allergies No Known Allergies   Sherrilyn Rist, MD [facility] Pager: See Shea Evans

## 2021-03-07 NOTE — Progress Notes (Signed)
Patient awake, agitated and refusing to be care. Refused to have a bath, change dressing and morning lab. She was screaming to leave her alone and don't touch her. Will try again in later time.

## 2021-03-07 NOTE — Progress Notes (Signed)
Code status confirmed with son Dr Harlow Asa, patient remains a full code for now.

## 2021-03-07 NOTE — Progress Notes (Signed)
Upon waking up, patient was extremely anxious, agitated, confused, argued to get out of bed and taking off her IV,NGT, monitors. Called Elink MD and ordered to start Precedex gtt. Will continue to monitor patient and titrate Precedex accordingly.

## 2021-03-07 NOTE — Progress Notes (Signed)
2 Days Post-Op   Subjective/Chief Complaint: Agitated during the night. Started on precedex   Objective: Vital signs in last 24 hours: Temp:  [97.5 F (36.4 C)-98.4 F (36.9 C)] 97.7 F (36.5 C) (12/18 0400) Pulse Rate:  [39-113] 66 (12/18 0630) Resp:  [9-28] 15 (12/18 0630) BP: (73-148)/(29-84) 86/39 (12/18 0630) SpO2:  [91 %-100 %] 91 % (12/18 0630) Last BM Date: 03/05/21  Intake/Output from previous day: 12/17 0701 - 12/18 0700 In: 1976.6 [I.V.:1781.3; IV Piggyback:195.3] Out: 1105 [Urine:410; Emesis/NG output:125; Drains:570] Intake/Output this shift: No intake/output data recorded.  General appearance: sedated Resp: clear to auscultation bilaterally Cardio: regular rate and rhythm GI: soft, less tender. Drain output serosanguinous  Lab Results:  Recent Labs    03/06/21 0149 03/06/21 0801  WBC 14.2* 14.5*  HGB 8.9* 8.2*  HCT 27.2* 25.2*  PLT 330 249   BMET Recent Labs    03/06/21 0149 03/06/21 0801  NA 131* 131*  K 4.9 5.4*  CL 104 104  CO2 21* 22  GLUCOSE 132* 114*  BUN 42* 45*  CREATININE 1.95* 2.02*  CALCIUM 7.8* 7.9*   PT/INR No results for input(s): LABPROT, INR in the last 72 hours. ABG No results for input(s): PHART, HCO3 in the last 72 hours.  Invalid input(s): PCO2, PO2  Studies/Results: DG Abd 1 View  Result Date: 03/06/2021 CLINICAL DATA:  87 year old female status post nasogastric tube placement. EXAM: ABDOMEN - 1 VIEW COMPARISON:  Abdominal radiograph 03/05/2021. FINDINGS: Nasogastric tube noted coiled in the stomach, with tip in the proximal aspect of the stomach. Several borderline dilated loops of small bowel are noted in the right lower quadrant of the abdomen measuring up to 2.9 cm in diameter. Some gas and stool is also noted throughout the colon. No gross pneumoperitoneum confidently identified on this single view examination. Rod and screw fixation hardware throughout the lumbosacral spine. IMPRESSION: 1. Nasogastric tube is  present with tip coiled in the proximal stomach. 2. Nonspecific, nonobstructive bowel gas pattern, as above. No pneumoperitoneum. Electronically Signed   By: Vinnie Langton M.D.   On: 03/06/2021 05:29   DG CHEST PORT 1 VIEW  Result Date: 03/06/2021 CLINICAL DATA:  Acute respiratory failure EXAM: PORTABLE CHEST 1 VIEW COMPARISON:  12/26/2016 FINDINGS: Interval development of a tiny left pleural effusion. Lungs are clear. No pneumothorax. Asymmetric enlargement of the right hilum is accentuated by mild rotation on this examination and likely reflects stable enlargement of the central pulmonary arteries better appreciated on CT examination of 03/05/2021. Pulmonary vascularity is normal. Nasogastric tube extends into the upper abdomen beyond the margin of the examination. IMPRESSION: Nasogastric tube in expected position. Interval development of a tiny left pleural effusion. Electronically Signed   By: Fidela Salisbury M.D.   On: 03/06/2021 01:38   DG Abdomen Acute W/Chest  Result Date: 03/05/2021 CLINICAL DATA:  Abdomen pain with nausea vomiting EXAM: DG ABDOMEN ACUTE WITH 1 VIEW CHEST COMPARISON:  12/26/2016 FINDINGS: Single-view chest demonstrates clear left lung field. Nodular opacity in the right upper lobe with vague right suprahilar opacity. Asymmetric density in the right hilus. Normal cardiac size with aortic atherosclerosis. Supine and upright views of the abdomen demonstrate a nonobstructed gas pattern with moderate stool. Surgical hardware in the lumbosacral spine. Upright view demonstrates air-fluid level over the liver, raising concern for free air. IMPRESSION: 1. Right apical nodularity, possibly due to lung nodule with additional vague airspace opacity in the right suprahilar lung and asymmetrically dense right hilus. Chest CT recommended for further  evaluation. 2. Findings suspicious for air-fluid level over the liver, raising concern for free air. CT abdomen and pelvis is recommended for  further evaluation. Critical Value/emergent results were called by telephone at the time of interpretation on 03/05/2021 at 8:44 pm to provider Davonna Belling , who verbally acknowledged these results. Electronically Signed   By: Donavan Foil M.D.   On: 03/05/2021 20:44   CT CHEST ABDOMEN PELVIS WO CONTRAST  Result Date: 03/05/2021 CLINICAL DATA:  Abdominal pain, with suspected lung nodule on chest x-ray today. EXAM: CT CHEST, ABDOMEN AND PELVIS WITHOUT CONTRAST TECHNIQUE: Multidetector CT imaging of the chest, abdomen and pelvis was performed following the standard protocol without IV contrast. COMPARISON:  Abdomen series with PA chest performed today, portable chest 12/26/2016. FINDINGS: CT CHEST FINDINGS Cardiovascular: The aorta and coronary arteries are heavily calcified with calcification extending into the aortic branch arteries. There is a small anterior pericardial effusion. Borderline aneurysmal ascending aorta 3.9 cm, otherwise normal caliber aorta. Normal caliber central pulmonary arteries and veins noted with mild cardiomegaly. Mediastinum/Nodes: Heterogeneous mass with patchy calcifications replaces the right lobe of the thyroid gland and measures 4.0 x 3.7 x 5.3 cm. Left lobe is unremarkable. This displaces the trachea left of the midline without significant tracheal narrowing. There are calcified right hilar lymph nodes. No noncalcified thoracic or axillary adenopathy is seen without contrast. There is a paucity of body fat which may suggest cachexia. Small hiatal hernia. Lungs/Pleura: There are trace pleural effusions, with small calcified pleural plaquing in the posterior basal left chest with no other focal pleural abnormality or pneumothorax. There is a 5 mm subpleural noncalcified right middle lobe nodule laterally on series 6 axial 92. the lungs are slightly emphysematous without appreciable infiltrates. There is calcified material filling a vertically directed right upper lobe  suprahilar subsegmental bronchus as well as small branching calcifications more laterally in the right upper lobe, both findings consistent with calcified endobronchial inspissated secretions. In the apicolateral right upper lobe just above this level, there are nodular and branching opacities compatible with bronchiolar mucous plugging. There is faint hazy interstitial change seen just above this in the peripheral right upper lobe which could be due to chronic small airways disease or pneumonitis. In the right middle lobe there similar branching opacities laterally consistent with additional bronchiolar mucous plugging. This was not seen on the 2018 chest x-ray. Remainder of the lungs are clear. 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 There is no worrisome regional skeletal lesion. Mild elevation right hemidiaphragm. CT ABDOMEN PELVIS FINDINGS Hepatobiliary: There are few scattered calcified granulomas. 1.3 cm cyst of 8.7 Hounsfield units in the left lobe lateral segment with no other focal abnormality. The gallbladder is distended and contains at least 1 calcified stone proximally, measuring 1.8 cm. No biliary dilatation is seen. There is a mildly thickened gallbladder wall which could be reactive due to free fluid. Pancreas: Not well seen due to artifact related to spinal fusion hardware. No obvious mass or ductal dilatation. Spleen: Normal in size with scattered calcified granulomas. Adrenals/Urinary Tract: 3.6 cm cyst in the left upper pole measuring 1.3 Hounsfield units. Additional few bilateral small cysts, including a hyperdense 1 cm cyst in the inferior pole of the right kidney of  94 Hounsfield  units. No stone is seen , no hydroureteronephrosis or other focal abnormality. Normal bladder thickness, with prolapse of the bladder base two 5 cm below the urogenital diaphragm protruding inferiorly to just below the level of the vagina. Stomach/Bowel: There is no small bowel obstruction. Fold thickening is seen in the stomach and small bowel, no thickening of the wall the colon is seen, with advanced sigmoid diverticulosis noted. An appendix is not seen in this patient. The small bowel thickening could indicate nonspecific enteritis, congestive edema, or could be reactive from the presence of mild/moderate abdominopelvic free fluid. Vascular/Lymphatic: No adenopathy is seen. There is extensive aortoiliac/iliofemoral and visceral arterial calcific plaque. Maximum aortic diameter 2.3 cm Reproductive: The uterus is grossly intact but not well seen due to artifact from bilateral hip nailing hardware. No adnexal mass is evident. Other: There is moderate free air collecting in the upper abdomen around the liver and deep to the anterior wall to just below the level of the umbilicus. In the absence of recent laparoscopic procedure this would be consistent with a perforated hollow viscus although the site of perforation is not identified on this noncontrasted exam. No free air is seen however, in the pelvis which would be suspicious for a sigmoid perforation. There is mild body wall anasarca. Generalized mesenteric edema. Musculoskeletal: There is osteopenia, with posterior fusion hardware from L2-S1, evidence of at least mild loosening of the sacral bolts but no other hardware loosening suspected. There is posterior bone grafting at the levels of lumbar fusion. Grade 1 L5-S1 spondylolisthesis with severe acquired spinal canal stenosis at this level. There is osteopenia with degenerative changes of the spine and hips and old hip nailing hardware without apparent loosening. No worrisome bone lesion. IMPRESSION: 1. Moderate  free air with mild-to-moderate free fluid in the abdomen. Findings consistent with perforated hollow viscus with the site of perforation not identified, but probably not in the pelvis. 2. Thickened small bowel segments potentially reactive due to the free fluid/leaked intestinal contents, versus other enteritis. 3. Generalized mesenteric edema/peritonitis. 4. Prolapses bladder protruding through the vagina. The ureteral insertions are not well evaluated due to artifact from bilateral hip nailing but are suspected to be at the level of the pelvic floor. There is no upstream hydronephrosis. 5. Aortic and coronary artery atherosclerosis with mild cardiomegaly, small pericardial effusion. 6. Branching/nodular opacities in the right upper lobe laterally with calcified inspissated endobronchial material in the right upper lobe in 2 locations. The noncalcified branching opacities are consistent with bronchiolar mucous plugging which is also seen to a lesser extent in a portion of the lateral right middle lobe. Additional findings of old granulomatous disease. Mild COPD. 7. 5.2 cm right thyroid mass with patchy calcifications displacing the trachea to the left. Ultrasound follow-up recommended when clinically feasible. 8. Small hiatal hernia.  No incarcerated hernias. 9. Generalized paucity of body fat which may be seen with cachexia, with mild body wall anasarca. 10. Cholelithiasis and mild gallbladder thickening, which could be from cholecystitis or reactive due to the adjacent fluid. 11. Constipation with sigmoid diverticulosis. 12. Discussed over the phone with Dr. Alvino Chapel at 9:04 p.m., 03/05/2021. Electronically Signed   By: Telford Nab M.D.   On: 03/05/2021 21:42    Anti-infectives: Anti-infectives (From admission, onward)    Start     Dose/Rate Route Frequency Ordered Stop   03/06/21 0400  piperacillin-tazobactam (ZOSYN) IVPB 2.25 g        2.25 g 100 mL/hr over 30 Minutes Intravenous  Every 6 hours  03/06/21 0216     03/05/21 2115  piperacillin-tazobactam (ZOSYN) IVPB 3.375 g        3.375 g 100 mL/hr over 30 Minutes Intravenous  Once 03/05/21 2101 03/05/21 2203       Assessment/Plan: s/p Procedure(s): EXPLORATORY LAPAROTOMY and graham patch repair of perforated duodenal ulcer (N/A) Continue ng and bowel rest Continue IV zosyn for duodenal perf Continue PPI Appreciate CCM assistance Continue to monitor in icu  LOS: 2 days    Autumn Messing III 03/07/2021

## 2021-03-08 DIAGNOSIS — K265 Chronic or unspecified duodenal ulcer with perforation: Secondary | ICD-10-CM | POA: Diagnosis not present

## 2021-03-08 LAB — GLUCOSE, CAPILLARY
Glucose-Capillary: 163 mg/dL — ABNORMAL HIGH (ref 70–99)
Glucose-Capillary: 42 mg/dL — CL (ref 70–99)
Glucose-Capillary: 128 mg/dL — ABNORMAL HIGH (ref 70–99)
Glucose-Capillary: 126 mg/dL — ABNORMAL HIGH (ref 70–99)

## 2021-03-08 LAB — CBC
HCT: 26.4 % — ABNORMAL LOW (ref 36.0–46.0)
Hemoglobin: 8.7 g/dL — ABNORMAL LOW (ref 12.0–15.0)
MCH: 32.7 pg (ref 26.0–34.0)
MCHC: 33 g/dL (ref 30.0–36.0)
MCV: 99.2 fL (ref 80.0–100.0)
Platelets: 220 10*3/uL (ref 150–400)
RBC: 2.66 MIL/uL — ABNORMAL LOW (ref 3.87–5.11)
RDW: 13.3 % (ref 11.5–15.5)
WBC: 17.6 10*3/uL — ABNORMAL HIGH (ref 4.0–10.5)
nRBC: 0 % (ref 0.0–0.2)

## 2021-03-08 LAB — BASIC METABOLIC PANEL
Anion gap: 11 (ref 5–15)
BUN: 47 mg/dL — ABNORMAL HIGH (ref 8–23)
CO2: 15 mmol/L — ABNORMAL LOW (ref 22–32)
Calcium: 8 mg/dL — ABNORMAL LOW (ref 8.9–10.3)
Chloride: 112 mmol/L — ABNORMAL HIGH (ref 98–111)
Creatinine, Ser: 2.49 mg/dL — ABNORMAL HIGH (ref 0.44–1.00)
GFR, Estimated: 18 mL/min — ABNORMAL LOW (ref >60)
Glucose, Bld: 51 mg/dL — ABNORMAL LOW (ref 70–99)
Potassium: 4.5 mmol/L (ref 3.5–5.1)
Sodium: 138 mmol/L (ref 135–145)

## 2021-03-08 LAB — PREALBUMIN: Prealbumin: 8.5 mg/dL — ABNORMAL LOW (ref 18–38)

## 2021-03-08 MED ORDER — CHLORHEXIDINE GLUCONATE 0.12 % MT SOLN
15.0000 mL | Freq: Two times a day (BID) | OROMUCOSAL | Status: DC
  Administered 2021-03-08 – 2021-03-10 (×4): 15 mL via OROMUCOSAL
  Filled 2021-03-08 (×3): qty 15

## 2021-03-08 MED ORDER — DEXTROSE 50 % IV SOLN
INTRAVENOUS | Status: AC
  Filled 2021-03-08: qty 50

## 2021-03-08 MED ORDER — DEXTROSE-NACL 5-0.45 % IV SOLN: INTRAVENOUS | Status: AC

## 2021-03-08 MED ORDER — KCL IN DEXTROSE-NACL 20-5-0.45 MEQ/L-%-% IV SOLN: INTRAVENOUS | Status: DC

## 2021-03-08 MED ORDER — DEXTROSE 50 % IV SOLN
25.0000 g | INTRAVENOUS | Status: AC
  Administered 2021-03-08: 10:00:00 25 g via INTRAVENOUS

## 2021-03-08 MED ORDER — SODIUM CHLORIDE 0.9 % IV BOLUS
500.0000 mL | Freq: Once | INTRAVENOUS | Status: AC
  Administered 2021-03-08: 23:00:00 500 mL via INTRAVENOUS

## 2021-03-08 NOTE — Progress Notes (Signed)
° °  NAME:  Kathleen Jimenez, MRN:  [id number], DOB:  22-Mar-1931, LOS: 3 ADMISSION DATE:  03/05/2021, CONSULTATION DATE:  03/05/21 REFERRING MD:  Surgery, CHIEF COMPLAINT:  post op perforated viscus   History of Present Illness:  87 year old F who presented to [facility] on 12/16 with 1 day hx of abdominal pain.  She has a past medical history of hypertension, carotid stenosis, chronic renal insuffiencey & anemia. CT scan of the abdomen revealed a perforated viscus and was taken to the OR for gastric repair of perforated duodenal ulcer with washout and placement of intra-abdominal drain.  Treated with ABX.  She was extubated in the PACU and admitted to SDU.  PCCM consulted for medical management.   Pertinent  Medical History  CKD  HTN  Left Carotid Stenosis  Anemia   Significant Hospital Events: Including procedures, antibiotic start and stop dates in addition to other pertinent events   12/16  s/p exploratory laparotomy for perforated duodenal ulcer 12/18 Agitated overnight, required precedex infusion  12/19 Off precedex, appears to be at baseline mental status  Interim History / Subjective:  Tmax 99.3  Pt denies pain, SOB  Off precedex   Objective   Blood pressure (!) 131/55, pulse 74, temperature 99.3 F (37.4 C), temperature source Axillary, resp. rate 14, height 5\' 3"  (1.6 m), weight 48.1 kg, SpO2 91 %.        Intake/Output Summary (Last 24 hours) at 03/08/2021 0805 Last data filed at 03/08/2021 0630 Gross per 24 hour  Intake 1759.36 ml  Output 725 ml  Net 1034.36 ml   Filed Weights   03/05/21 1942 03/06/21 0131  Weight: 52.2 kg 48.1 kg    Examination: General: frail elderly adult female lying in bed in NAD HEENT: MM pink/moist, fair dentition, anicteric  Neuro: awake, alert, speech clear, generalized weakness  CV: s1s2 RRR, SEM 2/6  PULM: non-labored at rest, lungs bilaterally clear  GI: soft, bsx4 active  Extremities: warm/dry, no edema  Skin: no rashes or  lesions   Resolved Hospital Problem list   Hypotension  Assessment & Plan:   Abdominal Pain in the setting of Perforated Duodenal Ulcer s/p Ex-Lap  Perforated duodenal ulcer s/p exploratory laparotomy -post operative, wound management per CCS -pain control with morphine PRN  -NPO -PPI  -continue zosyn IV  -early PT efforts   Leukocytosis secondary to Abdominal Perforation  -follow abdominal exam  Anemia Transfused 1 unit packed red blood cells 12/18. Baseline ~9-9.5 -follow CBC  -transfuse for Hgb <7% or active bleeding   Stage IV Chronic Kidney Disease At Risk Electrolyte Disturbance Baseline cr 1.9-2.4  -Trend BMP / urinary output -Replace electrolytes as indicated -Avoid nephrotoxic agents, ensure adequate renal perfusion  Hx HTN, HLD -hold home norvasc, coreg, hydralazine, crestor   Moderate Protein Calorie Malnutrition (POA) Prealbumin 8.5  -defer enteral feeding timing to CCS -may need parenteral feeding until can take PO's   Best Practice (right click and "Reselect all SmartList Selections" daily)  Diet/type: NPO DVT prophylaxis: other GI prophylaxis: PPI Lines: N/A Foley:  N/A Code Status:  full code Last date of multidisciplinary goals of care discussion: per primary       Noe Gens, MSN, APRN, NP-C, AGACNP-BC [facility] 03/08/2021, 8:05 AM   Please see [email or web address] for pager details.   From 7A-7P if no response, please call [phone] After hours, please call ELink [phone]

## 2021-03-08 NOTE — Progress Notes (Signed)
eLink Physician-Brief Progress Note Patient Name: Kathleen Jimenez DOB: 07-07-1931 MRN: [id number]   Date of Service  03/08/2021  HPI/Events of Note  Oliguria - Bladder scan with residual = 0. No CVL or CVP.   eICU Interventions  Plan: Bolus with 0.9 NaCl 500 mL IV over 1 hour now.      Intervention Category Major Interventions: Other:  Lysle Dingwall 03/08/2021, 10:41 PM

## 2021-03-08 NOTE — Evaluation (Signed)
Physical Therapy Evaluation Patient Details Name: Kathleen Jimenez MRN: [id number] DOB: 1931-05-08 Today's Date: 03/08/2021  History of Present Illness  87 yo female  s/p exploratory laparotomy for perforated duodenal ulcer on 12/16. PMH : IM nail, lumbar fusion, CKD  Clinical Impression  Pt admitted with above diagnosis.  PT resides at [facility], reports independence, amb with rollator.  Pt cooperative with PT today despite not feeling well overall, limited by fatigue. Pt was able to stand and take a few steps along EOB with overall min assist of 2. Recommend SNF vs HHPT depending on progress and pt/family wishes. Continue to follow   Pt currently with functional limitations due to the deficits listed below (see PT Problem List). Pt will benefit from skilled PT to increase their independence and safety with mobility to allow discharge to the venue listed below.          Recommendations for follow up therapy are one component of a multi-disciplinary discharge planning process, led by the attending physician.  Recommendations may be updated based on patient status, additional functional criteria and insurance authorization.  Follow Up Recommendations Skilled nursing-short term rehab (<3 hours/day) (vs HHPT at [facility])    Assistance Recommended at Discharge Frequent or constant Supervision/Assistance  Functional Status Assessment Patient has had a recent decline in their functional status and demonstrates the ability to make significant improvements in function in a reasonable and predictable amount of time.  Equipment Recommendations  None recommended by PT    Recommendations for Other Services       Precautions / Restrictions Precautions Precautions: Fall Restrictions Weight Bearing Restrictions: No      Mobility  Bed Mobility Overal bed mobility: Needs Assistance Bed Mobility: Rolling;Sidelying to Sit;Sit to Supine Rolling: Min assist;+2 for safety/equipment Sidelying to  sit: +2 for safety/equipment;+2 for physical assistance;Min assist   Sit to supine: Min assist;+2 for safety/equipment;+2 for physical assistance   General bed mobility comments: assist to elevate trunk and scoot to EOB. assist to lift LEs and control descent of trunk to supine    Transfers Overall transfer level: Needs assistance Equipment used: 2 person hand held assist Transfers: Sit to/from Stand Sit to Stand: Min assist;+2 physical assistance;+2 safety/equipment           General transfer comment: assist to rise and steady, lateral steps along EOB. fatigues easily    Ambulation/Gait               General Gait Details: pt felt too fatigued today  Stairs            Wheelchair Mobility    Modified Rankin (Stroke Patients Only)       Balance Overall balance assessment: Needs assistance;History of Falls Sitting-balance support: Feet supported;No upper extremity supported Sitting balance-Leahy Scale: Fair     Standing balance support: Reliant on assistive device for balance;During functional activity Standing balance-Leahy Scale: Poor Standing balance comment: reliant on bil HHA                             Pertinent Vitals/Pain Pain Assessment: Faces Faces Pain Scale: Hurts a little bit Pain Location: all over Pain Descriptors / Indicators: Discomfort;Sore;Grimacing Pain Intervention(s): Limited activity within patient's tolerance;Monitored during session;Repositioned    Home Living Family/patient expects to be discharged to:: Unsure                 Home Equipment: Rollator (4 wheels) Additional Comments: pt resides at [facility].  Prior Function Prior Level of Function : Independent/Modified Independent             Mobility Comments: amb with rollator; amb to DR routinely for meals ADLs Comments: reports independence     Hand Dominance        Extremity/Trunk Assessment   Upper Extremity Assessment Upper  Extremity Assessment: Generalized weakness    Lower Extremity Assessment Lower Extremity Assessment: Generalized weakness       Communication   Communication: No difficulties  Cognition Arousal/Alertness: Awake/alert Behavior During Therapy: WFL for tasks assessed/performed Overall Cognitive Status: Within Functional Limits for tasks assessed                                 General Comments: pt is pleasant and cooperative despite reporting fatigue, generally not feeling well and wanting to "nap"        General Comments      Exercises     Assessment/Plan    PT Assessment Patient needs continued PT services  PT Problem List Decreased strength;Decreased mobility;Decreased activity tolerance;Decreased balance;Decreased knowledge of use of DME       PT Treatment Interventions DME instruction;Therapeutic activities;Gait training;Functional mobility training;Therapeutic exercise;Patient/family education;Balance training    PT Goals (Current goals can be found in the Care Plan section)  Acute Rehab PT Goals Patient Stated Goal: to feel better PT Goal Formulation: With patient Time For Goal Achievement: 03/22/21 Potential to Achieve Goals: Good    Frequency Min 3X/week   Barriers to discharge        Co-evaluation               AM-PAC PT "6 Clicks" Mobility  Outcome Measure Help needed turning from your back to your side while in a flat bed without using bedrails?: A Little Help needed moving from lying on your back to sitting on the side of a flat bed without using bedrails?: A Little Help needed moving to and from a bed to a chair (including a wheelchair)?: A Lot Help needed standing up from a chair using your arms (e.g., wheelchair or bedside chair)?: A Lot Help needed to walk in hospital room?: Total Help needed climbing 3-5 steps with a railing? : Total 6 Click Score: 12    End of Session   Activity Tolerance: Patient limited by  fatigue Patient left: in bed;with call bell/phone within reach;with bed alarm set;with nursing/sitter in room Nurse Communication: Mobility status PT Visit Diagnosis: Other abnormalities of gait and mobility (R26.89);Muscle weakness (generalized) (M62.81);Difficulty in walking, not elsewhere classified (R26.2)    Time: 0315-9458 PT Time Calculation (min) (ACUTE ONLY): 17 min   Charges:   PT Evaluation $PT Eval Low Complexity: [place], PT  Acute Rehab Dept ([place]) [phone] Pager [phone]  03/08/2021   [facility] 03/08/2021, 12:05 PM

## 2021-03-08 NOTE — Progress Notes (Signed)
3 Days Post-Op   Subjective/Chief Complaint: Looks much better today.  States that she is sore, but not in bad pain.  Denies nausea   Objective: Vital signs in last 24 hours: Temp:  [98.1 F (36.7 C)-99.8 F (37.7 C)] 99.8 F (37.7 C) (12/19 0800) Pulse Rate:  [25-81] 74 (12/19 0600) Resp:  [11-19] 14 (12/19 0600) BP: (106-152)/(36-67) 131/55 (12/19 0600) SpO2:  [91 %-100 %] 91 % (12/19 0600) Last BM Date: 03/05/21  Intake/Output from previous day: 12/18 0701 - 12/19 0700 In: 1759.4 [I.V.:1179.2; Blood:300; IV Piggyback:280.1] Out: 765 [Urine:485; Emesis/NG output:25; Drains:255] Intake/Output this shift: No intake/output data recorded.  Gen: A&O, NAD Resp: breathing comfortably CV Irregular Abd - soft, sl distended, drain serosang Ext: no edema.     Lab Results:  Recent Labs    03/07/21 0802 03/07/21 1621 03/08/21 0856  WBC 15.2*  --  17.6*  HGB 6.8* 9.0* 8.7*  HCT 22.2* 27.3* 26.4*  PLT 191  --  220   BMET Recent Labs    03/06/21 0801 03/07/21 0802  NA 131* 134*  K 5.4* 4.8  CL 104 107  CO2 22 19*  GLUCOSE 114* 72  BUN 45* 45*  CREATININE 2.02* 2.31*  CALCIUM 7.9* 7.8*   PT/INR No results for input(s): LABPROT, INR in the last 72 hours. ABG No results for input(s): PHART, HCO3 in the last 72 hours.  Invalid input(s): PCO2, PO2  Studies/Results: No results found.  Anti-infectives: Anti-infectives (From admission, onward)    Start     Dose/Rate Route Frequency Ordered Stop   03/06/21 0400  piperacillin-tazobactam (ZOSYN) IVPB 2.25 g        2.25 g 100 mL/hr over 30 Minutes Intravenous Every 6 hours 03/06/21 0216     03/05/21 2115  piperacillin-tazobactam (ZOSYN) IVPB 3.375 g        3.375 g 100 mL/hr over 30 Minutes Intravenous  Once 03/05/21 2101 03/05/21 2203       Assessment/Plan: s/p Procedure(s): EXPLORATORY LAPAROTOMY and graham patch repair of perforated duodenal ulcer (N/A) Continue ng and bowel rest Likely upper gi tomorrow  or Wednesday depending on vitals/labs/mental status  Continue IV zosyn for duodenal perf Continue PPI Appreciate CCM assistance, discussed with CCM NP Continue to monitor in icu PT consult pending: she was too agitated yesterday to participate.   LOS: 3 days    [name] 03/08/2021

## 2021-03-09 ENCOUNTER — Inpatient Hospital Stay: Payer: Self-pay

## 2021-03-09 DIAGNOSIS — K265 Chronic or unspecified duodenal ulcer with perforation: Secondary | ICD-10-CM | POA: Diagnosis not present

## 2021-03-09 LAB — GLUCOSE, CAPILLARY
Glucose-Capillary: 126 mg/dL — ABNORMAL HIGH (ref 70–99)
Glucose-Capillary: 133 mg/dL — ABNORMAL HIGH (ref 70–99)
Glucose-Capillary: 144 mg/dL — ABNORMAL HIGH (ref 70–99)
Glucose-Capillary: 149 mg/dL — ABNORMAL HIGH (ref 70–99)
Glucose-Capillary: 158 mg/dL — ABNORMAL HIGH (ref 70–99)
Glucose-Capillary: 179 mg/dL — ABNORMAL HIGH (ref 70–99)

## 2021-03-09 LAB — BASIC METABOLIC PANEL
Anion gap: 7 (ref 5–15)
BUN: 41 mg/dL — ABNORMAL HIGH (ref 8–23)
CO2: 18 mmol/L — ABNORMAL LOW (ref 22–32)
Calcium: 7.9 mg/dL — ABNORMAL LOW (ref 8.9–10.3)
Chloride: 115 mmol/L — ABNORMAL HIGH (ref 98–111)
Creatinine, Ser: 2.54 mg/dL — ABNORMAL HIGH (ref 0.44–1.00)
GFR, Estimated: 18 mL/min — ABNORMAL LOW (ref >60)
Glucose, Bld: 152 mg/dL — ABNORMAL HIGH (ref 70–99)
Potassium: 3.7 mmol/L (ref 3.5–5.1)
Sodium: 140 mmol/L (ref 135–145)

## 2021-03-09 LAB — TYPE AND SCREEN
ABO/RH(D): A POS
Antibody Screen: NEGATIVE
Unit division: 0
Unit division: 0

## 2021-03-09 LAB — CBC
HCT: 25.8 % — ABNORMAL LOW (ref 36.0–46.0)
Hemoglobin: 8.2 g/dL — ABNORMAL LOW (ref 12.0–15.0)
MCH: 32.2 pg (ref 26.0–34.0)
MCHC: 31.8 g/dL (ref 30.0–36.0)
MCV: 101.2 fL — ABNORMAL HIGH (ref 80.0–100.0)
Platelets: 198 10*3/uL (ref 150–400)
RBC: 2.55 MIL/uL — ABNORMAL LOW (ref 3.87–5.11)
RDW: 13.2 % (ref 11.5–15.5)
WBC: 14.3 10*3/uL — ABNORMAL HIGH (ref 4.0–10.5)
nRBC: 0 % (ref 0.0–0.2)

## 2021-03-09 LAB — BPAM RBC
Blood Product Expiration Date: 202301152359
Blood Product Expiration Date: 202301152359
ISSUE DATE / TIME: 202212181028
Unit Type and Rh: 6200
Unit Type and Rh: 6200

## 2021-03-09 LAB — H. PYLORI ANTIBODY, IGG: H Pylori IgG: 0.07 Index Value (ref 0.00–0.79)

## 2021-03-09 LAB — PHOSPHORUS: Phosphorus: 3.8 mg/dL (ref 2.5–4.6)

## 2021-03-09 LAB — MAGNESIUM: Magnesium: 2.1 mg/dL (ref 1.7–2.4)

## 2021-03-09 MED ORDER — SODIUM CHLORIDE 0.9% FLUSH
10.0000 mL | INTRAVENOUS | Status: DC | PRN
  Administered 2021-03-12 – 2021-03-26 (×3): 10 mL
  Administered 2021-03-30: 20 mL

## 2021-03-09 MED ORDER — METOPROLOL TARTRATE 5 MG/5ML IV SOLN
2.5000 mg | INTRAVENOUS | Status: DC | PRN
Start: 2021-03-09 — End: 2021-03-30
  Administered 2021-03-10: 14:00:00 2.5 mg via INTRAVENOUS
  Administered 2021-03-10: 18:00:00 5 mg via INTRAVENOUS
  Administered 2021-03-11: 12:00:00 2.5 mg via INTRAVENOUS
  Administered 2021-03-12: 06:00:00 5 mg via INTRAVENOUS
  Filled 2021-03-09 (×4): qty 5

## 2021-03-09 MED ORDER — ALBUMIN HUMAN 25 % IV SOLN
25.0000 g | Freq: Four times a day (QID) | INTRAVENOUS | Status: AC
  Administered 2021-03-09 – 2021-03-11 (×8): 25 g via INTRAVENOUS
  Filled 2021-03-09 (×7): qty 100

## 2021-03-09 MED ORDER — DEXTROSE-NACL 5-0.45 % IV SOLN: INTRAVENOUS | Status: AC

## 2021-03-09 MED ORDER — TRAVASOL 10 % IV SOLN
INTRAVENOUS | Status: DC
  Filled 2021-03-09: qty 360

## 2021-03-09 MED ORDER — ACETAMINOPHEN 10 MG/ML IV SOLN
1000.0000 mg | Freq: Four times a day (QID) | INTRAVENOUS | Status: AC
  Administered 2021-03-09 – 2021-03-10 (×3): 1000 mg via INTRAVENOUS
  Filled 2021-03-09 (×3): qty 100

## 2021-03-09 MED ORDER — SODIUM CHLORIDE 0.9% FLUSH
10.0000 mL | Freq: Two times a day (BID) | INTRAVENOUS | Status: DC
  Administered 2021-03-09 – 2021-03-10 (×3): 10 mL
  Administered 2021-03-11: 09:00:00 20 mL
  Administered 2021-03-12 – 2021-03-25 (×6): 10 mL

## 2021-03-09 MED ORDER — FENTANYL CITRATE (PF) 100 MCG/2ML IJ SOLN
25.0000 ug | INTRAMUSCULAR | Status: DC | PRN
  Administered 2021-03-10 (×3): 50 ug via INTRAVENOUS
  Filled 2021-03-09 (×4): qty 2

## 2021-03-09 NOTE — Progress Notes (Addendum)
NAME:  Kathleen Jimenez, MRN:  [id number], DOB:  28-Jul-1931, LOS: 4 ADMISSION DATE:  03/05/2021, CONSULTATION DATE:  03/05/21 REFERRING MD:  Surgery, CHIEF COMPLAINT:  post op perforated viscus   History of Present Illness:  87 year old F who presented to [facility] on 12/16 with 1 day hx of abdominal pain.  She has a past medical history of hypertension, carotid stenosis, chronic renal insuffiencey & anemia. CT scan of the abdomen revealed a perforated viscus and was taken to the OR for gastric repair of perforated duodenal ulcer with washout and placement of intra-abdominal drain.  Treated with ABX.  She was extubated in the PACU and admitted to SDU.  PCCM consulted for medical management.   Pertinent  Medical History  CKD  HTN  Left Carotid Stenosis  Anemia   Significant Hospital Events: Including procedures, antibiotic start and stop dates in addition to other pertinent events   12/16  s/p exploratory laparotomy for perforated duodenal ulcer 12/18 Agitated overnight, required precedex infusion  12/19 Off precedex, appears to be at baseline mental status, worked with PT   Interim History / Subjective:  Pt denies pain, SOB.  States she wants to sleep / get some rest.  Oliguria overnight > given normal saline bolus 500 ml  Afebrile  I/O 150 ml UOP, 400 ml out of drain, +2.2L in last 24h  Objective   Blood pressure (!) 148/44, pulse (!) 57, temperature 98.1 F (36.7 C), temperature source Oral, resp. rate 12, height 5\' 3"  (1.6 m), weight 48.1 kg, SpO2 94 %.        Intake/Output Summary (Last 24 hours) at 03/09/2021 6270 Last data filed at 03/09/2021 0600 Gross per 24 hour  Intake 2897.13 ml  Output 625 ml  Net 2272.13 ml   Filed Weights   03/05/21 1942 03/06/21 0131  Weight: 52.2 kg 48.1 kg    Examination: General: frail elderly adult female lying in bed in NAD HEENT: MM pink/moist, NGT in place, poor dentition, anicteric  Neuro: Awake, alert, oriented to self, pleasant in  conversation, unsure of why she is here, generalized weakness  CV: s1s2 RRR, SEM PULM: non-labored at rest, lungs bilaterally clear anterior, bibasilar crackles  GI: soft, abdominal dressing intact, drain in place with thin clear yellow-pink drainage  Extremities: warm/dry, trace dependent edema, arthritic hands Skin: no rashes or lesions  Resolved Hospital Problem list   Hypotension  Assessment & Plan:   Abdominal Pain in the setting of Perforated Duodenal Ulcer s/p Ex-Lap  Perforated duodenal ulcer s/p exploratory laparotomy 12/17 per Dr. Marlou Starks.   -wound mgmt and post operative care per CCS -PRN pain control with morphine  -NPO  -PPI  -continue zosyn IV  -follow up H.pylori antibody  -albumin boluses per CCS  Leukocytosis secondary to Abdominal Perforation  -follow clinical exam, WBC (improving)  Anemia Transfused 1 unit packed red blood cells 12/18. Baseline ~9-9.5 -trend H/H  -transfuse for Hgb <7% or active bleeding   Stage IV Chronic Kidney Disease At Risk Electrolyte Disturbance Baseline cr 1.9-2.4  -D5 1/2NS at 65ml/hr -Trend BMP / urinary output -Replace electrolytes as indicated -Avoid nephrotoxic agents, ensure adequate renal perfusion  Hx HTN, HLD -hold home PO agents > norvasc, hydralazine, coreg, crestor  -PRN Lopressor for SBP >160 sustained  Moderate Protein Calorie Malnutrition (POA) Prealbumin 8.5.  Decreased intake prior to admit x2 weeks.  -place PICC line, TPN per CCS -defer timing of enteral feeding efforts to CCS  Deconditioning of Critical Illness  -PT efforts  Best Practice (right click and "Reselect all SmartList Selections" daily)  Diet/type: NPO DVT prophylaxis: other GI prophylaxis: PPI Lines: N/A Foley:  N/A Code Status:  full code Last date of multidisciplinary goals of care discussion: per primary       Noe Gens, MSN, APRN, NP-C, AGACNP-BC [facility] 03/09/2021, 7:14 AM   Please see [email or web address]  for pager details.   From 7A-7P if no response, please call [phone] After hours, please call ELink [phone]

## 2021-03-09 NOTE — Progress Notes (Signed)
Initial Nutrition Assessment  DOCUMENTATION CODES:   Non-severe (moderate) malnutrition in context of chronic illness  INTERVENTION:  - TPN initiation and advancement per Pharmacist. - diet advancement as medically feasible.   Monitor magnesium, potassium, and phosphorus BID for at least 3 days, MD to replete as needed, as pt is at risk for refeeding syndrome given moderate malnutrition, patient unable to recall the last time she ate.   NUTRITION DIAGNOSIS:   Moderate Malnutrition related to chronic illness as evidenced by moderate fat depletion, severe muscle depletion, mild muscle depletion.  GOAL:   Patient will meet greater than or equal to 90% of their needs  MONITOR:   Diet advancement, Labs, Weight trends, Skin, Other (Comment) (TPN regimen)  REASON FOR ASSESSMENT:   Consult New TPN/TNA  ASSESSMENT:   87 year-old female with medical history of HTN, anemia, CKD, and L carotid stenosis. She presented to the ED due to abdominal pain which began the day PTA and reported sour taste in her mouth for several weeks. In the ED she had a CT which was consistent with free air and bowel perforation.  Patient is POD #3 ex lap with graham patch repair d/t perforated duodenal ulcer.   Patient discussed in rounds this AM. Pending PICC placement for initiation of TPN; order in place for TPN to start at 40 ml/hr today at 1800. Able to talk with Pharmacist prior to visit to patient's room.   She has been NPO since admission. NGT placed on 12/17 at ~0330 and currently with <100 ml in canister.  Patient laying in bed with no visitors present at the time of RD visit. Patient reports mild pain but denies any sharp pain. She is unable to recall the last time she ate. Patient somewhat drowsy during visit, does request RD to aid in adjusting blankets and pillows to make her more comfortable; assisted with this.   No additional nutrition-related information able to be obtained at this time.    Weight today is 111 lb and weight on 12/17 was 106 lb and weight had been stable since 04/25/17.   Surgeon's note from today states plan for upper GI 12/21 and possible NGT removal and CLD order following dependent on results.    Labs reviewed; CBGs: 126, 144, 149, 158 mg/dl, Cl: 115 mmol/l, BUN: 41 mg/dl, creatinine: 2.54 mg/dl, Ca: 7.9 mg/dl, GFR: 18 ml/min.   Medications reviewed; 80 mg IV protonix/day.  IVF; D5-1/2 NS @ 75 ml/hr (306 kcal/24 hrs) to decrease to 45 ml/hr at 1800 tonight (184 kcal/24 hrs).     NUTRITION - FOCUSED PHYSICAL EXAM:  Flowsheet Row Most Recent Value  Orbital Region Moderate depletion  Upper Arm Region Severe depletion  Thoracic and Lumbar Region Unable to assess  Buccal Region Moderate depletion  Temple Region Moderate depletion  Clavicle Bone Region Severe depletion  Clavicle and Acromion Bone Region Severe depletion  Scapular Bone Region Severe depletion  Dorsal Hand Moderate depletion  Patellar Region Mild depletion  Anterior Thigh Region No depletion  Posterior Calf Region No depletion  Edema (RD Assessment) Mild  [BLE]  Hair Reviewed  Eyes Reviewed  Mouth Reviewed  Skin Reviewed  Nails Reviewed       Diet Order:   Diet Order             Diet NPO time specified  Diet effective now                   EDUCATION NEEDS:   Not appropriate  for education at this time  Skin:  Skin Assessment: Skin Integrity Issues: Skin Integrity Issues:: Stage I, Incisions Stage I: sacrum Incisions: abdomen (12/17)  Last BM:  12/16  Height:   Ht Readings from Last 1 Encounters:  03/06/21 5\' 3"  (1.6 m)    Weight:   Wt Readings from Last 1 Encounters:  03/09/21 50.3 kg     Estimated Nutritional Needs:  Kcal:  1500-1700 kcal Protein:  75-90 grams Fluid:  >/= 1.7 L/day     Jarome Matin, MS, RD, LDN, CNSC Inpatient Clinical Dietitian RD pager # available in AMION  After hours/weekend pager # available in [facility]

## 2021-03-09 NOTE — Progress Notes (Signed)
4 Days Post-Op   Subjective/Chief Complaint: Still just complains of being tired.  No significant overnight events.     Objective: Vital signs in last 24 hours: Temp:  [98.1 F (36.7 C)-98.6 F (37 C)] 98.1 F (36.7 C) (12/20 0400) Pulse Rate:  [55-82] 57 (12/20 0700) Resp:  [8-20] 12 (12/20 0700) BP: (126-148)/(20-63) 148/44 (12/20 0700) SpO2:  [86 %-96 %] 94 % (12/20 0700) Last BM Date: 03/05/21  Intake/Output from previous day: 12/19 0701 - 12/20 0700 In: 2897.1 [I.V.:1987.4; NG/GT:60; IV Piggyback:849.8] Out: 625 [Urine:150; Emesis/NG output:75; Drains:400] Intake/Output this shift: No intake/output data recorded.  Gen: A&O, NAD Resp: breathing comfortably CV Irregular Abd - soft, sl distended, unchanged, drain serosang Ext: no edema.     Lab Results:  Recent Labs    03/08/21 0856 03/09/21 0257  WBC 17.6* 14.3*  HGB 8.7* 8.2*  HCT 26.4* 25.8*  PLT 220 198   BMET Recent Labs    03/08/21 0856 03/09/21 0257  NA 138 140  K 4.5 3.7  CL 112* 115*  CO2 15* 18*  GLUCOSE 51* 152*  BUN 47* 41*  CREATININE 2.49* 2.54*  CALCIUM 8.0* 7.9*   PT/INR No results for input(s): LABPROT, INR in the last 72 hours. ABG No results for input(s): PHART, HCO3 in the last 72 hours.  Invalid input(s): PCO2, PO2  Studies/Results: [place] EKG SITE RITE  Result Date: 03/09/2021 If Site Rite image not attached, placement could not be confirmed due to current cardiac rhythm.   Anti-infectives: Anti-infectives (From admission, onward)    Start     Dose/Rate Route Frequency Ordered Stop   03/06/21 0400  piperacillin-tazobactam (ZOSYN) IVPB 2.25 g        2.25 g 100 mL/hr over 30 Minutes Intravenous Every 6 hours 03/06/21 0216     03/05/21 2115  piperacillin-tazobactam (ZOSYN) IVPB 3.375 g        3.375 g 100 mL/hr over 30 Minutes Intravenous  Once 03/05/21 2101 03/05/21 2203       Assessment/Plan: s/p Procedure(s): EXPLORATORY LAPAROTOMY and graham patch repair of  perforated duodenal ulcer (N/A) 03/05/21  Dr. Marlou Starks  Continue ng and bowel rest Upper GI Wednesday and hopefully d/c NGT and start clears  Continue IV zosyn for duodenal perf. Still with leukocytosis, but this is improving.  Continue PPI Appreciate CCM assistance, discussed with CCM NP Continue to monitor, stepdown status  PT consult- currently SNF vs short term rehab recommended.  Pt still complains of fatigue which is limiting her activity.    Will start albumin boluses x 48 hours given oliguria. Will also start TPN as it will be awhile before her PO intake is improved.  Also, she had decreased po intake over the last 1-2 weeks.   Discussed with son.   LOS: 4 days    [name] 03/09/2021

## 2021-03-09 NOTE — Progress Notes (Signed)
PHARMACY - TOTAL PARENTERAL NUTRITION CONSULT NOTE   Indication:  Intolerance to enteral feeding  Patient Measurements: Height: 5\' 3"  (160 cm) Weight: 48.1 kg (106 lb 0.7 oz) IBW/kg (Calculated) : 52.4 TPN AdjBW (KG): 48.1 Body mass index is 18.78 kg/m. Usual Weight:   Assessment: 87 yo female with PMH hypertension, carotid stenosis, chronic renal insuffiencey & anemia admitted on 12/16 with abdominal pain.  CT revealed perforated viscus and was taken to the OR for gastric repair of perforated duodenal ulcer with washout and placement of intra-abdominal drain.  Pharmacy is consulted to start TPN on 12/20.  Glucose / Insulin: No hx DM.  CBGs low on 12/19, improved with D5 containing IVF. Latest CBG 149. Electrolytes: K at low end (3.7). Cl elevated (115), CO2 low (18). Others WNL including CorrCa (8.86) Renal: Hx CKD, SCr rising, oliguric. BUN elevated (41) Hepatic: AST/ALT 56/50 (12/17), Tbili and AlkPhos WNL. Alb 2.8 - Albumin 25g x 8 doses ordered 12/20-12/21 Intake / Output: Net I/O +2422, UOP 144ml/24h, NG 64ml/24hr, drain 463ml/24hr MIVF: D5/12NS at 75 ml/hr GI Imaging: - 12/16 CT abd Moderate free air with mild-to-moderate free fluid in the abdomen. Findings consistent with perforated hollow viscus.  GI Surgeries / Procedures:  - 12/17 exploratory laparotomy and graham patch repair for perforated duodenal ulcer  Central access: plan PICC 12/20 TPN start date: plan 12/20  Nutritional Goals: Goal TPN rate is ___ mL/hr (provides ___ g of protein and ___ kcals per day)  RD Assessment: pending   Current Nutrition:  NPO  Plan:  Start TPN at 3mL/hr at 1800 This will provide 36g protein, 727kcal per 24hr Electrolytes in TPN:  Na 70mEq/L,  K 43mEq/L,  Ca 6mEq/L,  Mg 44mEq/L,  Phos 63mmol/L.  Cl:Ac max acetate Add standard MVI and trace elements to TPN CBG q4h, initiate SSI if needed  Reduce MIVF to 45 mL/hr at 1800 Monitor TPN labs on Mon/Thurs, CMET/Mg/Phos in  AM  Peggyann Juba, PharmD, BCPS Pharmacy: [phone] 03/09/2021,9:19 AM

## 2021-03-09 NOTE — Evaluation (Signed)
Occupational Therapy Evaluation Patient Details Name: Kathleen Jimenez MRN: [id number] DOB: 1931/08/07 Today's Date: 03/09/2021   History of Present Illness 87 yo female  s/p exploratory laparotomy for perforated duodenal ulcer on 12/16. PMH : IM nail, lumbar fusion, CKD   Clinical Impression   Patient is currently requiring assistance with ADLs including total assist with toileting, and with LE dressing, maximum assist with bathing, and Min guard to supervision assist with seated grooming and UE dressing.  Current level of function is below patient's typical reported baseline, however pt does have memory deficits and no family present.  During this evaluation, patient was limited by mild post-op pain of 2/10, bladder incontinence in standing, generalized weakness, impaired activity tolerance, and memory deficits, all of which has the potential to impact patient's safety and independence during functional mobility, as well as performance for ADLs.  Patient lives at [facility].   Patient demonstrates good rehab potential, and should benefit from continued skilled occupational therapy services while in acute care to maximize safety, independence and quality of life at home.  Continued occupational therapy services in a less intense rehab setting prior to return home is recommended based on how pt progresses in therapy and on supervision and assistance that will be available to her upon discharge.  ?     Recommendations for follow up therapy are one component of a multi-disciplinary discharge planning process, led by the attending physician.  Recommendations may be updated based on patient status, additional functional criteria and insurance authorization.   Follow Up Recommendations  Skilled nursing-short term rehab (<3 hours/day) (vs HH OT if pt has 24 hour caregiver support.)    Assistance Recommended at Discharge Frequent or constant Supervision/Assistance  Functional Status Assessment   Patient has had a recent decline in their functional status and demonstrates the ability to make significant improvements in function in a reasonable and predictable amount of time.  Equipment Recommendations   (TBD)    Recommendations for Other Services       Precautions / Restrictions Precautions Precautions: Fall Restrictions Weight Bearing Restrictions: No      Mobility Bed Mobility Overal bed mobility: Needs Assistance Bed Mobility: Rolling;Sidelying to Sit;Sit to Supine Rolling: Min assist;+2 for safety/equipment Sidelying to sit: Mod assist;HOB elevated   Sit to supine: Mod assist;+2 for physical assistance   General bed mobility comments: Assist with elevating trunk after pt given step by step cues for sequencing. Increased effort and time for pt to push self up, therefore Mod As provided. When returning to supine pt had trunk support from CNA and LEs lifted onto bed with Mod as.    Transfers Overall transfer level: Needs assistance Equipment used: 2 person hand held assist Transfers: Sit to/from Stand Sit to Stand: Mod assist           General transfer comment: Pt stood x 2 reps from EOB to RW with Mod As as pt with difficulty keeping feet in place.  Pt able to stand staically with BUE RW support for ~5 min during bed change. Pt given opportunities to sit and rest as needed, but stated that she preferred standing.  Consistently Min Guard assist with standing, very stooped over.      Balance Overall balance assessment: Needs assistance;History of Falls Sitting-balance support: Feet supported;No upper extremity supported Sitting balance-Leahy Scale: Fair     Standing balance support: Reliant on assistive device for balance;During functional activity;Bilateral upper extremity supported Standing balance-Leahy Scale: Poor Standing balance comment: reliant on bil  HHA             High level balance activites: Side stepping High Level Balance Comments: Pt took  about 4 latearl steps to LT to move up in bed with OT negiatiting RW for pt and pt given 1-step cues for sequence with Min As for balance.           ADL either performed or assessed with clinical judgement   ADL Overall ADL's : Needs assistance/impaired   Eating/Feeding Details (indicate cue type and reason): NGT Grooming: Wash/dry face;Minimal assistance;Sitting;Cueing for sequencing Grooming Details (indicate cue type and reason): Min As for lines. Cues to initiate. Upper Body Bathing: Minimal assistance;Cueing for sequencing;Sitting   Lower Body Bathing: Total assistance;+2 for physical assistance;Sit to/from stand Lower Body Bathing Details (indicate cue type and reason): Pt found to have wet pad from shifted pure wick. Pt stood with BUE support on RW and OT assisting for standing for safety while CNA provided Total Assist to bathe peri areas and LEs. Upper Body Dressing : Minimal assistance;Sitting   Lower Body Dressing: Total assistance;Sit to/from stand Lower Body Dressing Details (indicate cue type and reason): To don socks.   Toilet Transfer Details (indicate cue type and reason): Please see Mobility section. Toileting- Clothing Manipulation and Hygiene: Total assistance;Sit to/from stand Toileting - Clothing Manipulation Details (indicate cue type and reason): Pt reports that she is normally continent of bladder, but having uncontorlled void of bladder x 2 with standing. Total Assist for peri care and clothing management. Pt unaware of voiding bladder.     Functional mobility during ADLs: Moderate assistance;Rolling walker (2 wheels);+2 for safety/equipment       Vision   Vision Assessment?: No apparent visual deficits Additional Comments: Pt able to read OT's badge but not clock on wall.  Pt reports that she can see clock, but giving wrong time. May be more cognition-related.     Perception     Praxis      Pertinent Vitals/Pain Pain Score: 2  Pain Location: Ex Lap  site Pain Descriptors / Indicators: Sore Pain Intervention(s): Limited activity within patient's tolerance;Monitored during session     Hand Dominance Right   Extremity/Trunk Assessment Upper Extremity Assessment Upper Extremity Assessment: Generalized weakness;RUE deficits/detail;LUE deficits/detail RUE Deficits / Details: edematous arm from upper arm to finger tips with arthritic appearing deformities at joints. RUE Coordination: decreased fine motor;decreased gross motor LUE Deficits / Details: edematous arm from upper arm to finger tips with arthritic appearing deformities at joints. LUE Coordination: decreased fine motor;decreased gross motor   Lower Extremity Assessment Lower Extremity Assessment: Generalized weakness   Cervical / Trunk Assessment Cervical / Trunk Assessment: Kyphotic;Other exceptions Cervical / Trunk Exceptions: Bent forward in standing with only fleeting ability to raise head x 2 reps.   Communication Communication Communication: No difficulties   Cognition Arousal/Alertness: Awake/alert Behavior During Therapy: WFL for tasks assessed/performed Overall Cognitive Status: Within Functional Limits for tasks assessed                                 General Comments: A&Ox4 with cues to give more specific situational information which pt was able to provide. Pt does have short term meory loss, repeating self, asking same questions re: time of day and day of the week. Was not seen in morning due to pt request with 2 attempts, "I'd like to sleep a little more." In afternoon, pt repeated this but  agreed to OT Evaluation with encouragement.     General Comments       Exercises     Shoulder Instructions      Home Living Family/patient expects to be discharged to:: Unsure                             Home Equipment: Rollator (4 wheels)   Additional Comments: pt resides at [facility].      Prior Functioning/Environment Prior  Level of Function : Independent/Modified Independent             Mobility Comments: amb with rollator; amb to DR routinely for meals ADLs Comments: reports independence with all BADLs.        OT Problem List: Increased edema;Decreased activity tolerance;Decreased safety awareness;Decreased range of motion;Decreased coordination;Decreased strength;Pain;Decreased knowledge of precautions;Decreased knowledge of use of DME or AE;Impaired balance (sitting and/or standing)      OT Treatment/Interventions:      OT Goals(Current goals can be found in the care plan section) Acute Rehab OT Goals Patient Stated Goal: "whatever you want to do." OT Goal Formulation: Patient unable to participate in goal setting Potential to Achieve Goals: Good ADL Goals Pt Will Perform Lower Body Bathing: with adaptive equipment;sitting/lateral leans;with supervision Pt Will Perform Lower Body Dressing: with supervision;with set-up;with adaptive equipment;sitting/lateral leans;sit to/from stand Pt Will Transfer to Toilet: with supervision;ambulating Pt Will Perform Toileting - Clothing Manipulation and hygiene: with min guard assist;sitting/lateral leans;sit to/from stand;with adaptive equipment Pt/caregiver will Perform Home Exercise Program: Both right and left upper extremity;Increased strength;With Supervision;With minimal assist (and manage BUE edema)  OT Frequency:     Barriers to D/C:            Co-evaluation              AM-PAC OT "6 Clicks" Daily Activity     Outcome Measure Help from another person eating meals?: Total (NGT) Help from another person taking care of personal grooming?: A Little Help from another person toileting, which includes using toliet, bedpan, or urinal?: Total Help from another person bathing (including washing, rinsing, drying)?: A Lot Help from another person to put on and taking off regular upper body clothing?: A Little Help from another person to put on and  taking off regular lower body clothing?: Total 6 Click Score: 11   End of Session Equipment Utilized During Treatment: Gait belt;Rolling walker (2 wheels) Nurse Communication: Other (comment) (Rn and CNA into room to assist with finding RW and to assist with bed linen change.)  Activity Tolerance: Patient tolerated treatment well Patient left: in bed;with nursing/sitter in room  OT Visit Diagnosis: Unsteadiness on feet (R26.81);Other symptoms and signs involving cognitive function;History of falling (Z91.81);Muscle weakness (generalized) (M62.81);Pain                Time: 1325-1400 OT Time Calculation (min): 35 min Charges:  OT General Charges $OT Visit: 1 Visit OT Evaluation $OT Eval Moderate Complexity: 1 Mod OT Treatments $Therapeutic Activity: 8-22 mins  Anderson Malta, OT Acute Rehab Services Office: [phone] 03/09/2021  Julien Girt 03/09/2021, 2:21 PM

## 2021-03-09 NOTE — Discharge Instructions (Signed)
[facility] °[phone] ° °OPEN ABDOMINAL SURGERY: POST OP INSTRUCTIONS ° °Always review your discharge instruction sheet given to you by the facility where your surgery was performed. ° °IF YOU HAVE DISABILITY OR FAMILY LEAVE FORMS, YOU MUST BRING THEM TO THE OFFICE FOR PROCESSING.  PLEASE DO NOT GIVE THEM TO YOUR DOCTOR. ° °A prescription for pain medication may be given to you upon discharge.  Take your pain medication as prescribed, if needed.  If narcotic pain medicine is not needed, then you may take acetaminophen (Tylenol) or ibuprofen (Advil) as needed. °Take your usually prescribed medications unless otherwise directed. °If you need a refill on your pain medication, please contact your pharmacy. They will contact our office to request authorization.  Prescriptions will not be filled after 5pm or on week-ends. °You should follow a light diet the first few days after arrival home, such as soup and crackers, pudding, etc.unless your doctor has advised otherwise. A high-fiber, low fat diet can be resumed as tolerated.   Be sure to include lots of fluids daily. Most patients will experience some swelling and bruising on the chest and neck area.  Ice packs will help.  Swelling and bruising can take several days to resolve °Most patients will experience some swelling and bruising in the area of the incision. Ice pack will help. Swelling and bruising can take several days to resolve..  °It is common to experience some constipation if taking pain medication after surgery.  Increasing fluid intake and taking a stool softener will usually help or prevent this problem from occurring.  A mild laxative (Milk of Magnesia or Miralax) should be taken according to package directions if there are no bowel movements after 48 hours. ° You may have steri-strips (small skin tapes) in place directly over the incision.  These strips should be left on the skin for 7-10 days.  If your surgeon used skin  glue on the incision, you may shower in 24 hours.  The glue will flake off over the next 2-3 weeks.  Any sutures or staples will be removed at the office during your follow-up visit. You may find that a light gauze bandage over your incision may keep your staples from being rubbed or pulled. You may shower and replace the bandage daily. °ACTIVITIES:  You may resume regular (light) daily activities beginning the next day--such as daily self-care, walking, climbing stairs--gradually increasing activities as tolerated.  You may have sexual intercourse when it is comfortable.  Refrain from any heavy lifting or straining until approved by your doctor. °You may drive when you no longer are taking prescription pain medication, you can comfortably wear a seatbelt, and you can safely maneuver your car and apply brakes ° °You should see your doctor in the office for a follow-up appointment approximately two weeks after your surgery.  Make sure that you call for this appointment within a day or two after you arrive home to insure a convenient appointment time. ° °WHEN TO CALL YOUR DOCTOR: °Fever over 101.0 °Inability to urinate °Nausea and/or vomiting °Extreme swelling or bruising °Continued bleeding from incision. °Increased pain, redness, or drainage from the incision. °Difficulty swallowing or breathing °Muscle cramping or spasms. °Numbness or tingling in hands or feet or around lips. ° °The clinic staff is available to answer your questions during regular business hours.  Please don’t hesitate to call and ask to speak to one of the nurses if you have concerns. ° °For   further questions, please visit [email or web address]   WOUND CARE: - dressing to be changed twice daily - supplies: sterile saline, kerlix/guaze, scissors, ABD pads, tape  - remove dressing and all packing carefully, moistening with sterile saline as needed to avoid packing/internal dressing sticking to the wound. - clean edges of skin around  the wound with water/gauze, making sure there is no tape debris or leakage left on skin that could cause skin irritation or breakdown. - dampen clean kerlix/gauze with sterile saline and pack wound from wound base to skin level, making sure to take note of any possible areas of wound tracking, tunneling and packing appropriately. Wound can be packed loosely. Trim kerlix/gauze to size if a whole roll/piece is not required. - cover wound with a dry ABD pad and secure with tape.  - write the date/time on the dry dressing/tape to better track when the last dressing change occurred. - apply any skin protectant/powder recommended by clinician to protect skin/skin folds. - change dressing as needed if leakage occurs, wound gets contaminated, or patient requests to shower. - patient may shower daily with wound open and following the shower the wound should be dried and a clean dressing placed.

## 2021-03-09 NOTE — Progress Notes (Signed)
Peripherally Inserted Central Catheter Placement  The IV Nurse has discussed with the patient and/or persons authorized to consent for the patient, the purpose of this procedure and the potential benefits and risks involved with this procedure.  The benefits include less needle sticks, lab draws from the catheter, and the patient may be discharged home with the catheter. Risks include, but not limited to, infection, bleeding, blood clot (thrombus formation), and puncture of an artery; nerve damage and irregular heartbeat and possibility to perform a PICC exchange if needed/ordered by physician.  Alternatives to this procedure were also discussed.  Bard Power PICC patient education guide, fact sheet on infection prevention and patient information card has been provided to patient /or left at bedside.    PICC Placement Documentation  PICC Triple Lumen 53/29/92 PICC Left Basilic 37 cm 0 cm (Active)  Indication for Insertion or Continuance of Line Administration of hyperosmolar/irritating solutions (i.e. TPN, Vancomycin, etc.) 03/09/21 1538  Exposed Catheter (cm) 0 cm 03/09/21 1538  Site Assessment Clean;Dry;Intact 03/09/21 1538  Lumen #1 Status Flushed;Saline locked;Blood return noted 03/09/21 1538  Lumen #2 Status Flushed;Saline locked;Blood return noted 03/09/21 1538  Lumen #3 Status Flushed;Saline locked;Blood return noted 03/09/21 1538  Dressing Type Transparent;Securing device 03/09/21 1538  Dressing Status Clean;Dry;Intact 03/09/21 1538  Antimicrobial disc in place? Yes 03/09/21 1538  Safety Lock Not Applicable 42/68/34 1962  Dressing Intervention Other (Comment) 03/09/21 1538  Dressing Change Due 03/16/21 03/09/21 1538    Consent signed per patient's son, Armandina Gemma MD, via telephone consent. Verified by 2 PICC RNs. RUE very swollen. LFA is also swollen. PICC placed on LUA.   Enos Fling 03/09/2021, 3:39 PM

## 2021-03-09 NOTE — Progress Notes (Signed)
Pharmacy Antibiotic Note  Kathleen Jimenez is a 87 y.o. female admitted on 03/05/2021 with  intra-abdominal infection .  Ex lap and graham patch repair of perforated duodenal ulcer. Pharmacy has been consulted for Zosyn dosing.  Day #5 Zosyn, POD#4 - Afebrile - WBC 14.3, improved - SCr 2.54, increased (baseline ~2.0) - Cultures neg to date  Plan: Continue Zosyn 2.25gm IV q6h Monitor renal function and cx data   Height: 5\' 3"  (160 cm) Weight: 48.1 kg (106 lb 0.7 oz) IBW/kg (Calculated) : 52.4  Temp (24hrs), Avg:98.5 F (36.9 C), Min:98.1 F (36.7 C), Max:99.8 F (37.7 C)  Recent Labs  Lab 03/05/21 1949 03/05/21 2012 03/06/21 0149 03/06/21 0801 03/07/21 0802 03/08/21 0856 03/09/21 0257  WBC 16.2*  --  14.2* 14.5* 15.2* 17.6* 14.3*  CREATININE 2.08*   < > 1.95* 2.02* 2.31* 2.49* 2.54*  LATICACIDVEN 1.1  --  1.8  --   --   --   --    < > = values in this interval not displayed.     Estimated Creatinine Clearance: 11.8 mL/min (A) (by C-G formula based on SCr of 2.54 mg/dL (H)).    No Known Allergies  Antimicrobials this admission: 12/17 Zosyn >>   Dose adjustments this admission:  Microbiology results: 12/17 BCx:  ngtd 12/17 MRSA PCR: neg  Thank you for allowing pharmacy to be a part of this patients care.  Peggyann Juba, PharmD, BCPS Pharmacy: [phone] 03/09/2021 7:42 AM

## 2021-03-10 ENCOUNTER — Inpatient Hospital Stay (HOSPITAL_COMMUNITY): Payer: PPO

## 2021-03-10 DIAGNOSIS — E440 Moderate protein-calorie malnutrition: Secondary | ICD-10-CM | POA: Diagnosis present

## 2021-03-10 DIAGNOSIS — K265 Chronic or unspecified duodenal ulcer with perforation: Secondary | ICD-10-CM | POA: Diagnosis not present

## 2021-03-10 LAB — GLUCOSE, CAPILLARY
Glucose-Capillary: 191 mg/dL — ABNORMAL HIGH (ref 70–99)
Glucose-Capillary: 179 mg/dL — ABNORMAL HIGH (ref 70–99)
Glucose-Capillary: 180 mg/dL — ABNORMAL HIGH (ref 70–99)
Glucose-Capillary: 141 mg/dL — ABNORMAL HIGH (ref 70–99)
Glucose-Capillary: 128 mg/dL — ABNORMAL HIGH (ref 70–99)
Glucose-Capillary: 124 mg/dL — ABNORMAL HIGH (ref 70–99)
Glucose-Capillary: 139 mg/dL — ABNORMAL HIGH (ref 70–99)

## 2021-03-10 LAB — COMPREHENSIVE METABOLIC PANEL
ALT: 22 U/L (ref 0–44)
AST: 17 U/L (ref 15–41)
Albumin: 2.9 g/dL — ABNORMAL LOW (ref 3.5–5.0)
Alkaline Phosphatase: 38 U/L (ref 38–126)
Anion gap: 7 (ref 5–15)
BUN: 38 mg/dL — ABNORMAL HIGH (ref 8–23)
CO2: 19 mmol/L — ABNORMAL LOW (ref 22–32)
Calcium: 8 mg/dL — ABNORMAL LOW (ref 8.9–10.3)
Chloride: 111 mmol/L (ref 98–111)
Creatinine, Ser: 2.19 mg/dL — ABNORMAL HIGH (ref 0.44–1.00)
GFR, Estimated: 21 mL/min — ABNORMAL LOW (ref >60)
Glucose, Bld: 203 mg/dL — ABNORMAL HIGH (ref 70–99)
Potassium: 3.6 mmol/L (ref 3.5–5.1)
Sodium: 137 mmol/L (ref 135–145)
Total Bilirubin: 0.8 mg/dL (ref 0.3–1.2)
Total Protein: 4.9 g/dL — ABNORMAL LOW (ref 6.5–8.1)

## 2021-03-10 LAB — CBC
HCT: 24.5 % — ABNORMAL LOW (ref 36.0–46.0)
Hemoglobin: 7.6 g/dL — ABNORMAL LOW (ref 12.0–15.0)
MCH: 31.8 pg (ref 26.0–34.0)
MCHC: 31 g/dL (ref 30.0–36.0)
MCV: 102.5 fL — ABNORMAL HIGH (ref 80.0–100.0)
Platelets: 154 10*3/uL (ref 150–400)
RBC: 2.39 MIL/uL — ABNORMAL LOW (ref 3.87–5.11)
RDW: 13.1 % (ref 11.5–15.5)
WBC: 9.1 10*3/uL (ref 4.0–10.5)
nRBC: 0 % (ref 0.0–0.2)

## 2021-03-10 LAB — PHOSPHORUS: Phosphorus: 2.9 mg/dL (ref 2.5–4.6)

## 2021-03-10 LAB — MAGNESIUM: Magnesium: 2 mg/dL (ref 1.7–2.4)

## 2021-03-10 LAB — TRIGLYCERIDES: Triglycerides: 43 mg/dL (ref <150)

## 2021-03-10 MED ORDER — PANTOPRAZOLE SODIUM 40 MG IV SOLR
40.0000 mg | Freq: Two times a day (BID) | INTRAVENOUS | Status: DC
  Administered 2021-03-10 – 2021-03-13 (×7): 40 mg via INTRAVENOUS
  Filled 2021-03-10 (×7): qty 40

## 2021-03-10 MED ORDER — TRAVASOL 10 % IV SOLN
INTRAVENOUS | Status: AC
  Filled 2021-03-10: qty 360

## 2021-03-10 MED ORDER — DEXTROSE 10 % IV SOLN: INTRAVENOUS | Status: AC

## 2021-03-10 MED ORDER — INSULIN ASPART 100 UNIT/ML IJ SOLN
0.0000 [IU] | INTRAMUSCULAR | Status: DC
  Administered 2021-03-10 (×3): 1 [IU] via SUBCUTANEOUS
  Administered 2021-03-10: 08:00:00 2 [IU] via SUBCUTANEOUS
  Administered 2021-03-11 – 2021-03-13 (×13): 1 [IU] via SUBCUTANEOUS

## 2021-03-10 MED ORDER — ACETAMINOPHEN 10 MG/ML IV SOLN
1000.0000 mg | Freq: Four times a day (QID) | INTRAVENOUS | Status: AC
  Administered 2021-03-10 – 2021-03-11 (×4): 1000 mg via INTRAVENOUS
  Filled 2021-03-10 (×4): qty 100

## 2021-03-10 MED ORDER — SODIUM ACETATE 2 MEQ/ML IV SOLN
INTRAVENOUS | Status: DC
Start: 2021-03-10 — End: 2021-03-10

## 2021-03-10 MED ORDER — TRAVASOL 10 % IV SOLN
INTRAVENOUS | Status: DC
Start: 2021-03-10 — End: 2021-03-10

## 2021-03-10 MED ORDER — CHLORHEXIDINE GLUCONATE 0.12 % MT SOLN
15.0000 mL | Freq: Two times a day (BID) | OROMUCOSAL | Status: DC
  Administered 2021-03-10 – 2021-03-30 (×30): 15 mL via OROMUCOSAL
  Filled 2021-03-10 (×32): qty 15

## 2021-03-10 MED ORDER — SODIUM CHLORIDE 0.45 % IV SOLN: INTRAVENOUS | Status: DC

## 2021-03-10 MED ORDER — TRAVASOL 10 % IV SOLN
INTRAVENOUS | Status: AC
  Filled 2021-03-10: qty 600

## 2021-03-10 MED ORDER — ORAL CARE MOUTH RINSE
15.0000 mL | Freq: Two times a day (BID) | OROMUCOSAL | Status: DC
  Administered 2021-03-11 – 2021-03-28 (×12): 15 mL via OROMUCOSAL

## 2021-03-10 NOTE — Progress Notes (Signed)
NG tube came out whiles patient was trying to get out of bed without calling for help.. Pt apologetic after incidence. "I woke up confused". Ng output was 57ml for last 24hrs and zero for my shift. Upper GI studies planned for later today and possible removal of NG tube Order received from MD Greer Pickerel to keep tube out for now.

## 2021-03-10 NOTE — Progress Notes (Signed)
Progress Note  5 Days Post-Op  Subjective: Pt is sitting up in bed this AM and reports that she just wants to go to sleep and she is tired. She denies nausea or bloating but reportedly had some confusion overnight and removed NGT. UGI was this AM.   Objective: Vital signs in last 24 hours: Temp:  [97.4 F (36.3 C)-98 F (36.7 C)] 97.8 F (36.6 C) (12/21 0735) Pulse Rate:  [52-67] 55 (12/21 0900) Resp:  [10-16] 14 (12/21 0900) BP: (137-171)/(36-70) 153/36 (12/21 0900) SpO2:  [93 %-100 %] 100 % (12/21 0900) Weight:  [52.5 kg] 52.5 kg (12/21 0600) Last BM Date: 03/05/21  Intake/Output from previous day: 12/20 0701 - 12/21 0700 In: 2263.4 [I.V.:1523.5; IV Piggyback:739.9] Out: 1370 [Urine:950; Drains:420] Intake/Output this shift: No intake/output data recorded.  PE: General: pleasant, WD, frail female who is laying in bed in NAD HEENT: sclera anicteric Heart: regular, rate, and rhythm.   Lungs: CTAB, no wheezes, rhonchi, or rales noted.  Respiratory effort nonlabored Abd: soft, appropriately ttp, mild distention, +BS, JP in RLQ with serous fluid, midline with some fibrinous looking exudate but no surrounding cellulitis and no purulent appearing drainage    Lab Results:  Recent Labs    03/09/21 0257 03/10/21 0248  WBC 14.3* 9.1  HGB 8.2* 7.6*  HCT 25.8* 24.5*  PLT 198 154   BMET Recent Labs    03/09/21 0257 03/10/21 0248  NA 140 137  K 3.7 3.6  CL 115* 111  CO2 18* 19*  GLUCOSE 152* 203*  BUN 41* 38*  CREATININE 2.54* 2.19*  CALCIUM 7.9* 8.0*   PT/INR No results for input(s): LABPROT, INR in the last 72 hours. CMP     Component Value Date/Time   NA 137 03/10/2021 0248   K 3.6 03/10/2021 0248   CL 111 03/10/2021 0248   CO2 19 (L) 03/10/2021 0248   GLUCOSE 203 (H) 03/10/2021 0248   BUN 38 (H) 03/10/2021 0248   CREATININE 2.19 (H) 03/10/2021 0248   CALCIUM 8.0 (L) 03/10/2021 0248   PROT 4.9 (L) 03/10/2021 0248   ALBUMIN 2.9 (L) 03/10/2021 0248    AST 17 03/10/2021 0248   ALT 22 03/10/2021 0248   ALKPHOS 38 03/10/2021 0248   BILITOT 0.8 03/10/2021 0248   GFRNONAA 21 (L) 03/10/2021 0248   GFRAA 26 (L) 05/20/2017 0433   Lipase     Component Value Date/Time   LIPASE 108 (H) 03/05/2021 1949       Studies/Results: DG UGI W SINGLE CM (SOL OR THIN BA)  Result Date: 03/10/2021 CLINICAL DATA:  History of laparotomy for perforated duodenal ulcer on 12/16 of 2022. EXAM: WATER SOLUBLE UPPER GI SERIES TECHNIQUE: Single-column upper GI series was performed using water soluble contrast. CONTRAST:  100 cc Omnipaque 300 COMPARISON:  CT of 03/05/2021 FLUOROSCOPY TIME:  Fluoroscopy Time:  3 minutes 18 seconds Radiation Exposure Index (if provided by the fluoroscopic device): 40.5 mGy Number of Acquired Spot Images: 7 FINDINGS: Scout film first obtained showing a drain projecting over the mid and lower abdomen. Signs of spinal fusion which are associated with L2 through S1 spinal fusion and project over the central abdomen and area of interest on some images. Signs of bilateral femoral ORIF partially visualized.  Osteopenia. Water-soluble contrast was first administered in the RIGHT posterior oblique position showing passage from the esophagus into the stomach and beyond into the small bowel total of 50 cc administered initially. Limited esophageal and gastric evaluation was unremarkable. Contrast  passing readily into the duodenal bulb and first portion of the duodenum without visible leak. Note that contrast beyond this level became quickly diluted and did not opacify more distal loops as readily or completely likely due to some dilation or ileus. Descending duodenum the level of the 2nd-3rd portion of the duodenum with normal appearance. Beyond this the duodenum shows limited assessment. LPO positioning was then performed with swallowing of the second 50 cc dose of contrast. Mild irregularity of the proximal duodenum without visible leak. Repeat AP and RPO  images were also obtained to allow for further passage of contrast. No signs of contrast entering the surgical drain. IMPRESSION: Mild irregularity of the proximal duodenum without visible leak on this water-soluble evaluation. Duodenum beyond the second portion of the duodenum with limited assessment due to limited opacification perhaps due to mild dilation and fluid-filled nature of bowel beyond this level. Electronically Signed   By: Zetta Bills M.D.   On: 03/10/2021 09:45   [place] EKG SITE RITE  Result Date: 03/09/2021 If Site Rite image not attached, placement could not be confirmed due to current cardiac rhythm.   Anti-infectives: Anti-infectives (From admission, onward)    Start     Dose/Rate Route Frequency Ordered Stop   03/06/21 0400  piperacillin-tazobactam (ZOSYN) IVPB 2.25 g        2.25 g 100 mL/hr over 30 Minutes Intravenous Every 6 hours 03/06/21 0216     03/05/21 2115  piperacillin-tazobactam (ZOSYN) IVPB 3.375 g        3.375 g 100 mL/hr over 30 Minutes Intravenous  Once 03/05/21 2101 03/05/21 2203        Assessment/Plan Perforated duodenal ulcer S/P ex-lap with graham patch repair 03/05/21 Dr. Marlou Starks - POD5 - UGI this AM without leak  - would like to small volume clears this AM - continue to mobilize with PT/OT - monitor JP output  - will stop IV abx after POD7 - change PPI gtt to BID IV - H pylori is negative   FEN: bari clears, TPN VTE: LMWH ID: Zosyn 12/16>>  HTN HLD CKD stage IV - Cr trending down today at 2.19, UOP appears increased for last 24 hrs, baseline reportedly between 1.9 and 2.5 Carotid stenosis  Chronic anemia - hgb 7.6, continue to monitor  Moderate protein calorie malnutrition - TPN as listed above  Physical deconditioning   LOS: 5 days    Norm Parcel, [facility] 03/10/2021, 10:04 AM Please see Amion for pager number during day hours 7:00am-4:30pm

## 2021-03-10 NOTE — Progress Notes (Addendum)
NAME:  Kathleen Jimenez, MRN:  [id number], DOB:  1931-11-29, LOS: 5 ADMISSION DATE:  03/05/2021, CONSULTATION DATE:  03/05/21 REFERRING MD:  Surgery, CHIEF COMPLAINT:  post op perforated viscus   History of Present Illness:  87 year old F who presented to [facility] on 12/16 with 1 day hx of abdominal pain.  She has a past medical history of hypertension, carotid stenosis, chronic renal insuffiencey & anemia. CT scan of the abdomen revealed a perforated viscus and was taken to the OR for gastric repair of perforated duodenal ulcer with washout and placement of intra-abdominal drain.  Treated with ABX.  She was extubated in the PACU and admitted to SDU.  PCCM consulted for medical management.   Pertinent  Medical History  CKD  HTN  Left Carotid Stenosis  Anemia   Significant Hospital Events: Including procedures, antibiotic start and stop dates in addition to other pertinent events   12/16  s/p exploratory laparotomy for perforated duodenal ulcer 12/18 Agitated overnight, required precedex infusion  12/19 Off precedex, appears to be at baseline mental status, worked with PT  12/20 Oliguria, given IVF.  Pulled NGT out overnight, ok to leave out per CCS  Interim History / Subjective:  Pt denies pain, SOB  Afebrile  NGT accidentally pulled out overnight with brief period of patient confusion  I/O 600 ml UOP, +1.2L in last 24 hours   Objective   Blood pressure (!) 167/61, pulse (!) 56, temperature 98 F (36.7 C), temperature source Oral, resp. rate 14, height 5\' 3"  (1.6 m), weight 52.5 kg, SpO2 100 %.        Intake/Output Summary (Last 24 hours) at 03/10/2021 0720 Last data filed at 03/10/2021 0600 Gross per 24 hour  Intake 2263.39 ml  Output 1020 ml  Net 1243.39 ml   Filed Weights   03/06/21 0131 03/09/21 0918 03/10/21 0600  Weight: 48.1 kg 50.3 kg 52.5 kg    Examination: General: frail elderly female lying in bed in NAD   HEENT: MM pink/moist, poor dentition, anicteric  Neuro:  awake, alert, speech clear, oriented to self/events, appropriate in conversation  CV: s1s2 RRR, 2/6 SEM PULM: non-labored at rest, lungs bilaterally with bibasilar crackles  GI: soft, bsx4 hypoactive, abdominal dressing intact, jp drain with clear yellow fluid  Extremities: warm/dry, trace dependent edema  Skin: no rashes or lesions  Resolved Hospital Problem list   Hypotension  Assessment & Plan:   Abdominal Pain in the setting of Perforated Duodenal Ulcer s/p Ex-Lap  Perforated duodenal ulcer s/p exploratory laparotomy 12/17 per Dr. Marlou Starks.   -post op care and wound management per CCS -PRN pain control with morphine  -NPO -pending barium swallow 12/21  -zosyn IV, D6/x   -H.pylori antibody negative   Leukocytosis secondary to Abdominal Perforation  -improved, follow trend  Anemia Transfused 1 unit packed red blood cells 12/18. Baseline ~9-9.5 -monitor hgb trend, transfuse for Hgb <7% or active bleeding   Stage IV Chronic Kidney Disease At Risk Electrolyte Disturbance Baseline cr 1.9-2.4  -continue 1/2NS at 19ml/hr  -Trend BMP / urinary output -Replace electrolytes as indicated -Avoid nephrotoxic agents, ensure adequate renal perfusion  Hx HTN, HLD -hold home norvasc, hydralazine, coreg, crestor  -PRN lopressor for SBP >160 sustained   Moderate Protein Calorie Malnutrition (POA) Prealbumin 8.5.  Decreased intake prior to admit x2 weeks.  -albumin boluses per CCS -TPN per pharmacy  -pending swallow eval as above  Hyperglycemia  On TF  -SSI, sensitive scale   Deconditioning of Critical Illness  -  PT efforts    Best Practice (right click and "Reselect all SmartList Selections" daily)  Diet/type: NPO DVT prophylaxis: other GI prophylaxis: PPI Lines: Central line Foley:  N/A Code Status:  full code Last date of multidisciplinary goals of care discussion: per primary       Noe Gens, MSN, APRN, NP-C, AGACNP-BC [facility] 03/10/2021,  7:20 AM   Please see [email or web address] for pager details.   From 7A-7P if no response, please call [phone] After hours, please call ELink [phone]

## 2021-03-10 NOTE — Progress Notes (Signed)
PHARMACY - TOTAL PARENTERAL NUTRITION CONSULT NOTE   Indication:  Intolerance to enteral feeding  Patient Measurements: Height: 5\' 3"  (160 cm) Weight: 52.5 kg (115 lb 11.9 oz) IBW/kg (Calculated) : 52.4 TPN AdjBW (KG): 48.1 Body mass index is 20.5 kg/m. Usual Weight:   Assessment: 87 yo female with PMH hypertension, carotid stenosis, chronic renal insuffiencey & anemia admitted on 12/16 with abdominal pain.  CT revealed perforated viscus and was taken to the OR for gastric repair of perforated duodenal ulcer with washout and placement of intra-abdominal drain.  Pharmacy is consulted to start TPN on 12/20.  Glucose / Insulin: No hx DM.  CBGs low on 12/19, improved with D5 containing IVF. - Now increasing with start of TPN: range 179-181 Electrolytes: K at low end (3.6). Cl improved with max acetate in TPN, CO2 low (19). Others WNL including CorrCa (8.88) Renal: Hx CKD, SCr > baseline, oliguric. BUN elevated (38) Hepatic: AST/ALT elevated on 12/17 but improved to WNL, Tbili and AlkPhos WNL. Alb 2.9 - Albumin 25g x 8 doses ordered 12/20-12/21 Triglycerides: WNL (43 on 12/21) Prealb: 8.5 on 12/19 Intake / Output: Net I/O +1243, UOP 658ml/24h, NG fell out 12/20, drain 434ml/24hr MIVF: D5/12NS at 45 ml/hr GI Imaging: - 12/16 CT abd Moderate free air with mild-to-moderate free fluid in the abdomen. Findings consistent with perforated hollow viscus.  GI Surgeries / Procedures:  - 12/17 exploratory laparotomy and graham patch repair for perforated duodenal ulcer  Central access: PICC placed 12/20 TPN start date: 12/20  Nutritional Goals: Goal TPN rate is 70 mL/hr (provides 84 g of protein and 1612 kcals per day)  RD Assessment: pending Estimated Needs Total Energy Estimated Needs: 1500-1700 kcal Total Protein Estimated Needs: 75-90 grams Total Fluid Estimated Needs: >/= 1.7 L/day  Current Nutrition:  NPO  Plan:  Informed by RN that TPN disconnected in Radiology this AM. Will hang  D10W at 54ml/hr until 1800 when new TPN starts per protocol.  Increase TPN to 29mL/hr at 1800 This will provide  protein, kcal per 24hr Electrolytes in TPN:  Na 1mEq/L,  K 13mEq/L (increase),  Ca 38mEq/L,  Mg 90mEq/L,  Phos 58mmol/L.  Cl:Ac max acetate Add standard MVI and trace elements to TPN CBG q4h, initiate sensitive SSI and adjust as needed  Reduce MIVF to 25 mL/hr and change to 1/2 NS (remove dextrose) at 1800 Monitor TPN labs on Mon/Thurs  Peggyann Juba, PharmD, BCPS Pharmacy: [phone] 03/10/2021,7:05 AM

## 2021-03-10 NOTE — Progress Notes (Signed)
On assessment,  patient's right upper  arm is noted to be significantly bigger than left upper arm. No IV infusing in right arm. Will monitor.

## 2021-03-11 DIAGNOSIS — K265 Chronic or unspecified duodenal ulcer with perforation: Secondary | ICD-10-CM | POA: Diagnosis not present

## 2021-03-11 DIAGNOSIS — J9600 Acute respiratory failure, unspecified whether with hypoxia or hypercapnia: Secondary | ICD-10-CM | POA: Diagnosis not present

## 2021-03-11 LAB — COMPREHENSIVE METABOLIC PANEL
ALT: 15 U/L (ref 0–44)
AST: 22 U/L (ref 15–41)
Albumin: 3.5 g/dL (ref 3.5–5.0)
Alkaline Phosphatase: 30 U/L — ABNORMAL LOW (ref 38–126)
Anion gap: 7 (ref 5–15)
BUN: 35 mg/dL — ABNORMAL HIGH (ref 8–23)
CO2: 19 mmol/L — ABNORMAL LOW (ref 22–32)
Calcium: 8.3 mg/dL — ABNORMAL LOW (ref 8.9–10.3)
Chloride: 110 mmol/L (ref 98–111)
Creatinine, Ser: 1.91 mg/dL — ABNORMAL HIGH (ref 0.44–1.00)
GFR, Estimated: 25 mL/min — ABNORMAL LOW (ref >60)
Glucose, Bld: 144 mg/dL — ABNORMAL HIGH (ref 70–99)
Potassium: 5.1 mmol/L (ref 3.5–5.1)
Sodium: 136 mmol/L (ref 135–145)
Total Bilirubin: 1.1 mg/dL (ref 0.3–1.2)
Total Protein: 5.1 g/dL — ABNORMAL LOW (ref 6.5–8.1)

## 2021-03-11 LAB — CULTURE, BLOOD (ROUTINE X 2)
Culture: NO GROWTH
Culture: NO GROWTH
Special Requests: ADEQUATE
Special Requests: ADEQUATE

## 2021-03-11 LAB — MAGNESIUM: Magnesium: 1.9 mg/dL (ref 1.7–2.4)

## 2021-03-11 LAB — GLUCOSE, CAPILLARY
Glucose-Capillary: 144 mg/dL — ABNORMAL HIGH (ref 70–99)
Glucose-Capillary: 141 mg/dL — ABNORMAL HIGH (ref 70–99)
Glucose-Capillary: 143 mg/dL — ABNORMAL HIGH (ref 70–99)
Glucose-Capillary: 127 mg/dL — ABNORMAL HIGH (ref 70–99)
Glucose-Capillary: 133 mg/dL — ABNORMAL HIGH (ref 70–99)
Glucose-Capillary: 135 mg/dL — ABNORMAL HIGH (ref 70–99)

## 2021-03-11 LAB — CBC
HCT: 23.8 % — ABNORMAL LOW (ref 36.0–46.0)
Hemoglobin: 7.8 g/dL — ABNORMAL LOW (ref 12.0–15.0)
MCH: 32.2 pg (ref 26.0–34.0)
MCHC: 32.8 g/dL (ref 30.0–36.0)
MCV: 98.3 fL (ref 80.0–100.0)
Platelets: 145 10*3/uL — ABNORMAL LOW (ref 150–400)
RBC: 2.42 MIL/uL — ABNORMAL LOW (ref 3.87–5.11)
RDW: 13.3 % (ref 11.5–15.5)
WBC: 11 10*3/uL — ABNORMAL HIGH (ref 4.0–10.5)
nRBC: 0 % (ref 0.0–0.2)

## 2021-03-11 LAB — PHOSPHORUS: Phosphorus: 3 mg/dL (ref 2.5–4.6)

## 2021-03-11 MED ORDER — DIPHENHYDRAMINE HCL 50 MG/ML IJ SOLN
12.5000 mg | Freq: Every evening | INTRAMUSCULAR | Status: DC | PRN
  Administered 2021-03-12: 22:00:00 12.5 mg via INTRAVENOUS
  Filled 2021-03-11: qty 1

## 2021-03-11 MED ORDER — TRAVASOL 10 % IV SOLN
INTRAVENOUS | Status: AC
  Filled 2021-03-11: qty 600

## 2021-03-11 MED ORDER — FENTANYL CITRATE PF 50 MCG/ML IJ SOSY
25.0000 ug | PREFILLED_SYRINGE | INTRAMUSCULAR | Status: DC | PRN
Start: 2021-03-11 — End: 2021-03-12
  Administered 2021-03-11: 14:00:00 50 ug via INTRAVENOUS
  Filled 2021-03-11: qty 1

## 2021-03-11 NOTE — Progress Notes (Signed)
PHARMACY - TOTAL PARENTERAL NUTRITION CONSULT NOTE   Indication:  Intolerance to enteral feeding  Patient Measurements: Height: 5\' 3"  (160 cm) Weight: 52.5 kg (115 lb 11.9 oz) IBW/kg (Calculated) : 52.4 TPN AdjBW (KG): 48.1 Body mass index is 20.5 kg/m. Usual Weight:   Assessment: 87 yo female with PMH hypertension, carotid stenosis, chronic renal insuffiencey & anemia admitted on 12/16 with abdominal pain.  CT revealed perforated viscus and was taken to the OR for gastric repair of perforated duodenal ulcer with washout and placement of intra-abdominal drain.  Pharmacy is consulted to start TPN on 12/20.  Glucose / Insulin: No hx DM.  CBGs were low on 12/19, improved with D5 containing IVF. CBGs currently < 150, 7 units/24 hr, dextrose removed from IV fluid Electrolytes: K incr to 5.1, Cl improved with max acetate in TPN, CO2 cont low (19). Received total Albumin 100gm, Albumin 2.9 > 3.5 with CorrCa 8.7 (sl below normal range) Renal: Hx CKD, SCr > baseline - improving, oliguric 12/20. BUN elevated (35) Hepatic: AST/ALT elevated on 12/17 but improved to WNL, Tbili wnl, AlkPhos now low. Alb 2.9 - Albumin 25g x 8 doses received 12/20-12/21 > 3.5 Triglycerides: WNL (43 on 12/21) Prealb: 8.5 on 12/19 Intake / Output: Net I/O - 218ml , UOP 942ml/24h, NG fell out 12/20, drain 852ml/24hr - increased MIVF: 1/2NS at 25 ml/hr GI Imaging: - 12/16 CT abd Moderate free air with mild-to-moderate free fluid in the abdomen. Findings consistent with perforated hollow viscus.  GI Surgeries / Procedures:  - 12/17 exploratory laparotomy and graham patch repair for perforated duodenal ulcer 12/21 Informed by RN that TPN disconnected in Radiology this AM. Will hang D10W at 45ml/hr until 1800 when new TPN starts per protocol  Central access: PICC placed 12/20 TPN start date: 12/20  Nutritional Goals: Goal TPN rate is 70 mL/hr (provides 84 g of protein and 1612 kcals per day)  RD Assessment:  pending Estimated Needs Total Energy Estimated Needs: 1500-1700 kcal Total Protein Estimated Needs: 75-90 grams Total Fluid Estimated Needs: >/= 1.7 L/day  Current Nutrition:  Bariatric CL diet started 12/21 am - RN to encourage/assist with po intake  Plan:   Continue TPN at 59mL/hr at 1800 This will provide  60 g protein, 1150 kcal per 24hr Electrolytes in TPN:  Na 87mEq/L,  K mEq/L decr to 40 mEq/L Ca 76mEq/L,  Mg 19mEq/L,  Phos 72mmol/L.  Cl:Ac max acetate Add standard MVI and trace elements to TPN CBG q4h, initiate sensitive SSI and adjust as needed  Cont MIVF of 1/2 NS at 25 mL/hr  Monitor TPN labs on Mon/Thurs Bmet in am 12/23   [name] PharmD [facility] [phone] 03/11/2021, 7:23 AM

## 2021-03-11 NOTE — Progress Notes (Signed)
NAME:  Kathleen Jimenez, MRN:  [id number], DOB:  1931/05/23, LOS: 6 ADMISSION DATE:  03/05/2021, CONSULTATION DATE:  03/05/21 REFERRING MD:  Surgery, CHIEF COMPLAINT:  post op perforated viscus   History of Present Illness:  87 year old F who presented to [facility] on 12/16 with 1 day hx of abdominal pain.  She has a past medical history of hypertension, carotid stenosis, chronic renal insuffiencey & anemia. CT scan of the abdomen revealed a perforated viscus and was taken to the OR for gastric repair of perforated duodenal ulcer with washout and placement of intra-abdominal drain.  Treated with ABX.  She was extubated in the PACU and admitted to SDU.  PCCM consulted for medical management.   Pertinent  Medical History  CKD  HTN  Left Carotid Stenosis  Anemia   Significant Hospital Events: Including procedures, antibiotic start and stop dates in addition to other pertinent events   12/16  s/p exploratory laparotomy for perforated duodenal ulcer 12/18 Agitated overnight, required precedex infusion  12/19 Off precedex, appears to be at baseline mental status, worked with PT  12/20 Oliguria, given IVF.  Pulled NGT out overnight, ok to leave out per CCS 12/21 no leak on gastric study. Added clears 12/22 improving pain. No n/v with clears. C/o poor sleep   Interim History / Subjective:  Tolerating clear liquid diet (started 12/21)   This morning very small Vt on IS (<250) Additional education provided about directions for use, breathing exercises   Objective   Blood pressure (!) 167/49, pulse (!) 59, temperature 98.4 F (36.9 C), temperature source Axillary, resp. rate 15, height 5\' 3"  (1.6 m), weight 52.5 kg, SpO2 100 %.        Intake/Output Summary (Last 24 hours) at 03/11/2021 0915 Last data filed at 03/11/2021 0755 Gross per 24 hour  Intake 1656.91 ml  Output 1300 ml  Net 356.91 ml   Filed Weights   03/06/21 0131 03/09/21 0918 03/10/21 0600  Weight: 48.1 kg 50.3 kg 52.5 kg     Examination: General: elderly frail F reclined in bed NAD  HEENT: NCAT pink mm anicteric sclera  Neuro: AAOx3 following commands  CV: rr s1s2 cap refill < 3  PULM: Even unlabored. Shallow respirations  GI: soft ndnt  Extremities: no acute joint deformity. Non-pitting edema Skin: pale, c/d/w   Resolved Hospital Problem list   Hypotension   Assessment & Plan:   Perforated duodenal ulcer s/p ex lap and repair  Leukocytosis in setting of above  Perforated duodenal ulcer s/p exploratory laparotomy 12/17 per Dr. Marlou Starks.   UGI study without leak H pylori neg  P -trial of clears  -post op care and wound management per CCS -PRN pain control with morphine  -7d course zosyn (end date 12/24) -BID PPI   Acute on chronic anemia, stable  Transfused 1 unit packed red blood cells 12/18. Baseline ~9-9.5 P -PRN CBC   CKD IV  Baseline cr 1.9-2.4  P -continue 1/2NS at 56ml/hr  -trend renal indices, UOP   Hx HTN Hx HLD P -holding home meds  -PRN lopressor for SBP >160 sustained   Hyperglycemia On TF  P -SSI   Moderate protein calorie malnutrition (POA)  Prealbumin 8.5.  Decreased intake prior to admit x2 weeks.  S/p 8 doses albumin per CCS  P -on TPN -- duration per CCS  -advanced to clear liquids 12/21  Deconditioning in setting of critical illness -PT/ OT, mobility  -Cont  IS    Sleep disturbance, acute, in  setting of acute illness  -delirium precautions -ordinarily would add a [place] melatonin for 2000 but unable to at present. Will add a low dose PRN qHS IV benadryl   -d/w primary -- when comfortable, moving out of ICU/SDU to floor would likely also help optimize sleep  hygiene   Best Practice (right click and "Reselect all SmartList Selections" daily)  Diet/type: clear liquids DVT prophylaxis: other GI prophylaxis: PPI Lines: Central line Foley:  N/A Code Status:  full code Last date of multidisciplinary goals of care discussion: per primary   CCT  n/a  Eliseo Gum MSN, AGACNP-BC [place] for pager  03/11/2021, 9:15 AM

## 2021-03-11 NOTE — Progress Notes (Signed)
Physical Therapy Treatment Patient Details Name: Kathleen Jimenez MRN: [id number] DOB: [birth year] Today's Date: 03/11/2021   History of Present Illness 87 yo female  s/p exploratory laparotomy for perforated duodenal ulcer on 12/16. PMH : IM nail, lumbar fusion, CKD    PT Comments    Pt continues very pleasant and cooperative but requiring increased time and fatigues easily.  This date, pt assisted bed to [facility] and then ambulated short distance to sit up in recliner.     Recommendations for follow up therapy are one component of a multi-disciplinary discharge planning process, led by the attending physician.  Recommendations may be updated based on patient status, additional functional criteria and insurance authorization.  Follow Up Recommendations  Skilled nursing-short term rehab (<3 hours/day)     Assistance Recommended at Discharge Frequent or constant Supervision/Assistance  Equipment Recommendations  None recommended by PT    Recommendations for Other Services       Precautions / Restrictions Precautions Precautions: Fall Restrictions Weight Bearing Restrictions: No     Mobility  Bed Mobility Overal bed mobility: Needs Assistance Bed Mobility: Rolling;Sidelying to Sit Rolling: Min assist;+2 for safety/equipment Sidelying to sit: Mod assist;HOB elevated       General bed mobility comments: Assist with elevating trunk after pt given step by step cues for sequencing. Increased effort and time for pt to push self up, therefore Mod As provided.    Transfers Overall transfer level: Needs assistance Equipment used: Rolling walker (2 wheels) Transfers: Sit to/from Stand;Bed to chair/wheelchair/BSC Sit to Stand: Min assist;+2 physical assistance;+2 safety/equipment;From elevated surface Stand pivot transfers: Mod assist;From elevated surface         General transfer comment: Stand pvt bed to [facility] with mod assist.  Sit>stand from [facility] with RW     Ambulation/Gait Ambulation/Gait assistance: Min assist;Mod assist;+2 physical assistance;+2 safety/equipment Gait Distance (Feet): 4 Feet Assistive device: Rolling walker (2 wheels) Gait Pattern/deviations: Step-to pattern;Decreased step length - right;Decreased step length - left;Shuffle;Narrow base of support;Trunk flexed Gait velocity: decr     General Gait Details: Cues for posture and position from RW.  Assist for balance/support and RW management   Stairs             Wheelchair Mobility    Modified Rankin (Stroke Patients Only)       Balance Overall balance assessment: Needs assistance;History of Falls Sitting-balance support: Feet supported;No upper extremity supported Sitting balance-Leahy Scale: Fair     Standing balance support: Reliant on assistive device for balance;During functional activity;Bilateral upper extremity supported Standing balance-Leahy Scale: Poor Standing balance comment: reliant on bil HHA                            Cognition Arousal/Alertness: Awake/alert Behavior During Therapy: WFL for tasks assessed/performed Overall Cognitive Status: Within Functional Limits for tasks assessed                                          Exercises      General Comments        Pertinent Vitals/Pain Pain Assessment: Faces Faces Pain Scale: Hurts a little bit Pain Location: Ex Lap site Pain Descriptors / Indicators: Sore Pain Intervention(s): Limited activity within patient's tolerance;Monitored during session;Repositioned    Home Living  Prior Function            PT Goals (current goals can now be found in the care plan section) Acute Rehab PT Goals Patient Stated Goal: to feel better PT Goal Formulation: With patient Time For Goal Achievement: 03/22/21 Potential to Achieve Goals: Good Progress towards PT goals: Progressing toward goals    Frequency    Min  3X/week      PT Plan Current plan remains appropriate    Co-evaluation              AM-PAC PT "6 Clicks" Mobility   Outcome Measure  Help needed turning from your back to your side while in a flat bed without using bedrails?: A Little Help needed moving from lying on your back to sitting on the side of a flat bed without using bedrails?: A Lot Help needed moving to and from a bed to a chair (including a wheelchair)?: A Lot Help needed standing up from a chair using your arms (e.g., wheelchair or bedside chair)?: A Lot Help needed to walk in hospital room?: Total Help needed climbing 3-5 steps with a railing? : Total 6 Click Score: 11    End of Session Equipment Utilized During Treatment: Gait belt Activity Tolerance: Patient limited by fatigue Patient left: in chair;with call bell/phone within reach;with chair alarm set Nurse Communication: Mobility status PT Visit Diagnosis: Other abnormalities of gait and mobility (R26.89);Muscle weakness (generalized) (M62.81);Difficulty in walking, not elsewhere classified (R26.2)     Time: 9480-1655 PT Time Calculation (min) (ACUTE ONLY): 32 min  Charges:  $Gait Training: 8-22 mins $Therapeutic Activity: 8-22 mins                     Debe Coder PT Acute Rehabilitation Services Pager [phone] Office [phone]    Wesam Gearhart 03/11/2021, 12:59 PM

## 2021-03-11 NOTE — Progress Notes (Addendum)
Progress Note  6 Days Post-Op  Subjective: Pt is sitting up in bed this AM and reports that she is tired. She denies nausea or bloating but per RN had some confusion and insomnia overnight.  States she is tolerating clears but does not want anything more, +BM last night Objective: Vital signs in last 24 hours: Temp:  [97.5 F (36.4 C)-98.4 F (36.9 C)] 98.2 F (36.8 C) (12/22 0400) Pulse Rate:  [54-72] 59 (12/22 0612) Resp:  [9-21] 15 (12/22 0612) BP: (153-181)/(36-87) 167/49 (12/22 0612) SpO2:  [95 %-100 %] 100 % (12/22 0612) Last BM Date: 03/05/21  Intake/Output from previous day: 12/21 0701 - 12/22 0700 In: 1656.9 [I.V.:884.3; IV Piggyback:772.6] Out: 1320 [Urine:400; Drains:920] Intake/Output this shift: Total I/O In: -  Out: 100 [Drains:100]  PE: General: pleasant, WD, frail female who is laying in bed in NAD HEENT: sclera anicteric Heart: regular, rate, and rhythm.   Lungs: CTAB, no wheezes, rhonchi, or rales noted.  Respiratory effort nonlabored Abd: soft, appropriately ttp, mild distention, +BS, JP in RLQ with serous fluid, midline with some fibrinous looking exudate but no surrounding cellulitis and no purulent appearing drainage    Lab Results:  Recent Labs    03/10/21 0248 03/11/21 0256  WBC 9.1 11.0*  HGB 7.6* 7.8*  HCT 24.5* 23.8*  PLT 154 145*    BMET Recent Labs    03/10/21 0248 03/11/21 0256  NA 137 136  K 3.6 5.1  CL 111 110  CO2 19* 19*  GLUCOSE 203* 144*  BUN 38* 35*  CREATININE 2.19* 1.91*  CALCIUM 8.0* 8.3*    PT/INR No results for input(s): LABPROT, INR in the last 72 hours. CMP     Component Value Date/Time   NA 136 03/11/2021 0256   K 5.1 03/11/2021 0256   CL 110 03/11/2021 0256   CO2 19 (L) 03/11/2021 0256   GLUCOSE 144 (H) 03/11/2021 0256   BUN 35 (H) 03/11/2021 0256   CREATININE 1.91 (H) 03/11/2021 0256   CALCIUM 8.3 (L) 03/11/2021 0256   PROT 5.1 (L) 03/11/2021 0256   ALBUMIN 3.5 03/11/2021 0256   AST 22  03/11/2021 0256   ALT 15 03/11/2021 0256   ALKPHOS 30 (L) 03/11/2021 0256   BILITOT 1.1 03/11/2021 0256   GFRNONAA 25 (L) 03/11/2021 0256   GFRAA 26 (L) 05/20/2017 0433   Lipase     Component Value Date/Time   LIPASE 108 (H) 03/05/2021 1949       Studies/Results: DG UGI W SINGLE CM (SOL OR THIN BA)  Result Date: 03/10/2021 CLINICAL DATA:  History of laparotomy for perforated duodenal ulcer on 12/16 of 2022. EXAM: WATER SOLUBLE UPPER GI SERIES TECHNIQUE: Single-column upper GI series was performed using water soluble contrast. CONTRAST:  100 cc Omnipaque 300 COMPARISON:  CT of 03/05/2021 FLUOROSCOPY TIME:  Fluoroscopy Time:  3 minutes 18 seconds Radiation Exposure Index (if provided by the fluoroscopic device): 40.5 mGy Number of Acquired Spot Images: 7 FINDINGS: Scout film first obtained showing a drain projecting over the mid and lower abdomen. Signs of spinal fusion which are associated with L2 through S1 spinal fusion and project over the central abdomen and area of interest on some images. Signs of bilateral femoral ORIF partially visualized.  Osteopenia. Water-soluble contrast was first administered in the RIGHT posterior oblique position showing passage from the esophagus into the stomach and beyond into the small bowel total of 50 cc administered initially. Limited esophageal and gastric evaluation was unremarkable. Contrast passing  readily into the duodenal bulb and first portion of the duodenum without visible leak. Note that contrast beyond this level became quickly diluted and did not opacify more distal loops as readily or completely likely due to some dilation or ileus. Descending duodenum the level of the 2nd-3rd portion of the duodenum with normal appearance. Beyond this the duodenum shows limited assessment. LPO positioning was then performed with swallowing of the second 50 cc dose of contrast. Mild irregularity of the proximal duodenum without visible leak. Repeat AP and RPO  images were also obtained to allow for further passage of contrast. No signs of contrast entering the surgical drain. IMPRESSION: Mild irregularity of the proximal duodenum without visible leak on this water-soluble evaluation. Duodenum beyond the second portion of the duodenum with limited assessment due to limited opacification perhaps due to mild dilation and fluid-filled nature of bowel beyond this level. Electronically Signed   By: Zetta Bills M.D.   On: 03/10/2021 09:45   [place] EKG SITE RITE  Result Date: 03/09/2021 If Site Rite image not attached, placement could not be confirmed due to current cardiac rhythm.   Anti-infectives: Anti-infectives (From admission, onward)    Start     Dose/Rate Route Frequency Ordered Stop   03/06/21 0400  piperacillin-tazobactam (ZOSYN) IVPB 2.25 g        2.25 g 100 mL/hr over 30 Minutes Intravenous Every 6 hours 03/06/21 0216 03/13/21 0359   03/05/21 2115  piperacillin-tazobactam (ZOSYN) IVPB 3.375 g        3.375 g 100 mL/hr over 30 Minutes Intravenous  Once 03/05/21 2101 03/05/21 2203        Assessment/Plan Perforated duodenal ulcer S/P ex-lap with graham patch repair 03/05/21 Dr. Marlou Starks - POD6 - UGI without leak  - cont clears, pt does not want anything more - continue to mobilize with PT/OT - monitor JP output  - will stop IV abx after POD7 - PPI  BID IV - H pylori is negative   FEN: bari clears, TPN VTE: LMWH ID: Zosyn 12/16>>12/23  HTN HLD CKD stage IV - Cr trending down today, UOP appears increased for last 24 hrs, baseline reportedly between 1.9 and 2.5 Carotid stenosis  Chronic anemia - hgb 7.8, continue to monitor  Moderate protein calorie malnutrition - TPN as listed above  Physical deconditioning   Transfer to 3E  Will obtain TRH consult for medical management   LOS: 6 days    Rosario Adie, [facility] 03/11/2021, 8:05 AM Please see Amion for pager number during day hours 7:00am-4:30pm

## 2021-03-12 DIAGNOSIS — R198 Other specified symptoms and signs involving the digestive system and abdomen: Secondary | ICD-10-CM | POA: Diagnosis not present

## 2021-03-12 DIAGNOSIS — E440 Moderate protein-calorie malnutrition: Secondary | ICD-10-CM

## 2021-03-12 DIAGNOSIS — K265 Chronic or unspecified duodenal ulcer with perforation: Secondary | ICD-10-CM | POA: Diagnosis not present

## 2021-03-12 DIAGNOSIS — J9600 Acute respiratory failure, unspecified whether with hypoxia or hypercapnia: Secondary | ICD-10-CM | POA: Diagnosis not present

## 2021-03-12 LAB — BASIC METABOLIC PANEL
Anion gap: 6 (ref 5–15)
BUN: 38 mg/dL — ABNORMAL HIGH (ref 8–23)
CO2: 24 mmol/L (ref 22–32)
Calcium: 8.4 mg/dL — ABNORMAL LOW (ref 8.9–10.3)
Chloride: 109 mmol/L (ref 98–111)
Creatinine, Ser: 1.63 mg/dL — ABNORMAL HIGH (ref 0.44–1.00)
GFR, Estimated: 30 mL/min — ABNORMAL LOW (ref >60)
Glucose, Bld: 136 mg/dL — ABNORMAL HIGH (ref 70–99)
Potassium: 4.1 mmol/L (ref 3.5–5.1)
Sodium: 139 mmol/L (ref 135–145)

## 2021-03-12 LAB — GLUCOSE, CAPILLARY
Glucose-Capillary: 113 mg/dL — ABNORMAL HIGH (ref 70–99)
Glucose-Capillary: 126 mg/dL — ABNORMAL HIGH (ref 70–99)
Glucose-Capillary: 128 mg/dL — ABNORMAL HIGH (ref 70–99)
Glucose-Capillary: 133 mg/dL — ABNORMAL HIGH (ref 70–99)
Glucose-Capillary: 134 mg/dL — ABNORMAL HIGH (ref 70–99)
Glucose-Capillary: 134 mg/dL — ABNORMAL HIGH (ref 70–99)

## 2021-03-12 MED ORDER — TRAMADOL HCL 50 MG PO TABS
50.0000 mg | ORAL_TABLET | Freq: Four times a day (QID) | ORAL | Status: DC | PRN
Start: 2021-03-12 — End: 2021-03-17
  Administered 2021-03-15 – 2021-03-16 (×3): 50 mg via ORAL
  Filled 2021-03-12 (×3): qty 1

## 2021-03-12 MED ORDER — TRAVASOL 10 % IV SOLN
INTRAVENOUS | Status: AC
  Filled 2021-03-12: qty 600

## 2021-03-12 MED ORDER — ALBUMIN HUMAN 25 % IV SOLN
12.5000 g | Freq: Every day | INTRAVENOUS | Status: DC
  Administered 2021-03-12 – 2021-03-17 (×6): 12.5 g via INTRAVENOUS
  Filled 2021-03-12 (×7): qty 50

## 2021-03-12 MED ORDER — MORPHINE SULFATE (PF) 2 MG/ML IV SOLN
2.0000 mg | INTRAVENOUS | Status: DC | PRN
  Administered 2021-03-13 – 2021-03-15 (×2): 2 mg via INTRAVENOUS
  Filled 2021-03-12 (×2): qty 1

## 2021-03-12 NOTE — Progress Notes (Addendum)
PROGRESS NOTE    Kathleen Jimenez  QQV:[id number] DOB: [birth year] DOA: 03/05/2021 PCP: Ginger Organ., MD    Brief Narrative:  Medical consultation for inpatient management of CKD stage IV, HTN, and dyslipidemia   Patient admitted to the hospital with the working diagnosis of perforated viscus to the surgery team.  87 yo female who presented with 1 day of abdominal pain, work up in the ED showed perforated viscus and she was taken urgently to the operating room.   12/16 exploratory laparotomy   She was found to have a perforated duodenal ulcer, she underwent washout and intra-abdominal drain.  Placed on broad spectrum antibiotic therapy and transferred to the step down unit.   Patient developed agitation and delirium, and required precedex infusion.  She had NG tube post operative that was removed on 12/20.   Transferred to the med surg unit on 12/22.   Assessment & Plan:   Principal Problem:   Perforated duodenal ulcer ([place]) Active Problems:   Acute respiratory failure ([place])   Perforated abdominal viscus   Malnutrition of moderate degree   Perforated duodenal ulcer with peritonitis sp exploratory laparotomy and repair.  Patient with poor oral intake, she continue with TPN for nutritional support. Continue to be very weak and deconditioned  No nausea or vomiting, abdominal pain is controlled.  Continue to encourage mobility, out of bed to chair tid with meals   Patient on IV albumin.  IV antibiotic therapy with Zosyn   2. Acute post operative anemia, on chronic anemia  Follow up Hgb is 7,8 and hct at 23,8 No signs of active bleeding.  3. AKI on CKD stage IV  Renal function with serum cr at 1,63 with K at 4,1 and serum bicarbonate at 24. Continue close monitoring of renal function and electrolytes  Avoid hypotension and nephrotoxic medications   4. HTN her blood pressure has been stable with systolic 332 to 951 mmHg. Continue close monitoring.    5. Dyslipidemia   Continue statin therapy.   6. Moderate calorie protein malnutrition/ stage 1 sacrum pressure ulcer Continue nutritional support with TPN and oral nutritional supplementation   7. Acute metabolic encephalopathy with delirium. Patient is non focal, has no agitation and is following commands. She has been forgetful per nursing at the bedside  Continue neuro checks per unit protocol.      Status is: Inpatient  Remains inpatient appropriate because: post operative care   DVT prophylaxis: Enoxaparin   Code Status:    full  Family Communication:   No family at the bedside      Nutrition Status: Nutrition Problem: Moderate Malnutrition Etiology: chronic illness Signs/Symptoms: moderate fat depletion, severe muscle depletion, mild muscle depletion Interventions: TPN     Skin Documentation: Pressure Injury 03/06/21 Sacrum Mid Stage 1 -  Intact skin with non-blanchable redness of a localized area usually over a bony prominence. skin appears reddened, scabbed and does not blanch (Active)  03/06/21 0131  Location: Sacrum  Location Orientation: Mid  Staging: Stage 1 -  Intact skin with non-blanchable redness of a localized area usually over a bony prominence.  Wound Description (Comments): skin appears reddened, scabbed and does not blanch  Present on Admission: Yes      Subjective: Patient with no nausea or vomiting, continue to be very weak and deconditioned, poor oral intake and poor appetite   Objective: Vitals:   03/11/21 1320 03/11/21 2143 03/12/21 0537 03/12/21 0615  BP: (!) 161/71 (!) 143/66 (!) 162/60 Marland Kitchen)  171/72  Pulse: 62 63 67 72  Resp: 18 16 16    Temp: 97.9 F (36.6 C) 98.2 F (36.8 C) 98.2 F (36.8 C)   TempSrc: Oral Oral Oral   SpO2: 97% 97% 95%   Weight:      Height:        Intake/Output Summary (Last 24 hours) at 03/12/2021 1315 Last data filed at 03/12/2021 1204 Gross per 24 hour  Intake 1882.59 ml  Output 1270 ml  Net 612.59 ml   Filed Weights    03/06/21 0131 03/09/21 0918 03/10/21 0600  Weight: 48.1 kg 50.3 kg 52.5 kg    Examination:   General: Not in pain or dyspnea, deconditioned  Neurology: Awake and alert, non focal  E ENT: positive pallor, no icterus, oral mucosa moist Cardiovascular: No JVD. S1-S2 present, rhythmic, no gallops, rubs, or murmurs. No lower extremity edema. Pulmonary: positive breath sounds bilaterally, adequate air movement, no wheezing, rhonchi or rales. Gastrointestinal. Abdomen soft, mild tender to superficial palpation, surgical wound with dressing in place, drain in place.  Skin. No rashes Musculoskeletal: no joint deformities     Data Reviewed: I have personally reviewed following labs and imaging studies  CBC: Recent Labs  Lab 03/05/21 1949 03/05/21 2012 03/07/21 0802 03/07/21 1621 03/08/21 0856 03/09/21 0257 03/10/21 0248 03/11/21 0256  WBC 16.2*   < > 15.2*  --  17.6* 14.3* 9.1 11.0*  NEUTROABS 15.2*  --   --   --   --   --   --   --   HGB 9.6*   < > 6.8* 9.0* 8.7* 8.2* 7.6* 7.8*  HCT 28.9*   < > 22.2* 27.3* 26.4* 25.8* 24.5* 23.8*  MCV 99.3   < > 104.7*  --  99.2 101.2* 102.5* 98.3  PLT 338   < > 191  --  220 198 154 145*   < > = values in this interval not displayed.   Basic Metabolic Panel: Recent Labs  Lab 03/07/21 0802 03/08/21 0856 03/09/21 0257 03/10/21 0248 03/11/21 0256 03/12/21 0456  NA 134* 138 140 137 136 139  K 4.8 4.5 3.7 3.6 5.1 4.1  CL 107 112* 115* 111 110 109  CO2 19* 15* 18* 19* 19* 24  GLUCOSE 72 51* 152* 203* 144* 136*  BUN 45* 47* 41* 38* 35* 38*  CREATININE 2.31* 2.49* 2.54* 2.19* 1.91* 1.63*  CALCIUM 7.8* 8.0* 7.9* 8.0* 8.3* 8.4*  MG 2.0  --  2.1 2.0 1.9  --   PHOS  --   --  3.8 2.9 3.0  --    GFR: Estimated Creatinine Clearance: 20.1 mL/min (A) (by C-G formula based on SCr of 1.63 mg/dL (H)). Liver Function Tests: Recent Labs  Lab 03/05/21 1949 03/06/21 0149 03/10/21 0248 03/11/21 0256  AST 46* 56* 17 22  ALT 50* 50* 22 15  ALKPHOS  65 50 38 30*  BILITOT 0.6 0.8 0.8 1.1  PROT 5.8* 5.0* 4.9* 5.1*  ALBUMIN 3.0* 2.8* 2.9* 3.5   Recent Labs  Lab 03/05/21 1949  LIPASE 108*   No results for input(s): AMMONIA in the last 168 hours. Coagulation Profile: No results for input(s): INR, PROTIME in the last 168 hours. Cardiac Enzymes: No results for input(s): CKTOTAL, CKMB, CKMBINDEX, TROPONINI in the last 168 hours. BNP (last 3 results) No results for input(s): PROBNP in the last 8760 hours. HbA1C: No results for input(s): HGBA1C in the last 72 hours. CBG: Recent Labs  Lab 03/11/21 1959 03/11/21 2329 03/12/21  7829 03/12/21 0840 03/12/21 1202  GLUCAP 133* 135* 126* 134* 134*   Lipid Profile: Recent Labs    03/10/21 0248  TRIG 43   Thyroid Function Tests: No results for input(s): TSH, T4TOTAL, FREET4, T3FREE, THYROIDAB in the last 72 hours. Anemia Panel: No results for input(s): VITAMINB12, FOLATE, FERRITIN, TIBC, IRON, RETICCTPCT in the last 72 hours.    Radiology Studies: I have reviewed all of the imaging during this hospital visit personally     Scheduled Meds:  chlorhexidine  15 mL Mouth Rinse BID   Chlorhexidine Gluconate Cloth  6 each Topical Daily   enoxaparin (LOVENOX) injection  30 mg Subcutaneous Q24H   insulin aspart  0-9 Units Subcutaneous Q4H   mouth rinse  15 mL Mouth Rinse q12n4p   pantoprazole (PROTONIX) IV  40 mg Intravenous Q12H   sodium chloride flush  10-40 mL Intracatheter Q12H   Continuous Infusions:  albumin human 12.5 g (03/12/21 1224)   piperacillin-tazobactam (ZOSYN)  IV 2.25 g (03/12/21 1007)   TPN ADULT (ION) 50 mL/hr at 03/11/21 1704   TPN ADULT (ION)       LOS: 7 days        Maezie Justin Gerome Apley, MD

## 2021-03-12 NOTE — Progress Notes (Signed)
Occupational Therapy Treatment Patient Details Name: Kathleen Jimenez MRN: [id number] DOB: 03/24/1931 Today's Date: 03/12/2021   History of present illness 87 yo female  s/p exploratory laparotomy for perforated duodenal ulcer on 12/16. PMH : IM nail, lumbar fusion, CKD   OT comments  Patient requires verbal and sometimes tactile cues to perform log rolling and self care tasks. Total for pericare at bed level, set up with verbal cues to wash her face and hands. Min guard to stand and take steps to recliner with RW. Her affect is flat and verbalizations limited to only short responses. Continue POC.   Recommendations for follow up therapy are one component of a multi-disciplinary discharge planning process, led by the attending physician.  Recommendations may be updated based on patient status, additional functional criteria and insurance authorization.    Follow Up Recommendations  Skilled nursing-short term rehab (<3 hours/day)    Assistance Recommended at Discharge Frequent or constant Supervision/Assistance  Equipment Recommendations  Other (comment) (TBD)    Recommendations for Other Services      Precautions / Restrictions Precautions Precautions: Fall Precaution Comments: incontinent of urine and BM Restrictions Weight Bearing Restrictions: No       Mobility Bed Mobility Overal bed mobility: Needs Assistance Bed Mobility: Rolling;Sidelying to Sit Rolling: Min assist Sidelying to sit: Mod assist       General bed mobility comments: Min assist for rolling and mod assist to bring trunk up with verbal and tactile cues to bring legs to edge of bed and inititate sitting up    Transfers Overall transfer level: Needs assistance Equipment used: Rolling walker (2 wheels) Transfers: Sit to/from Stand;Bed to chair/wheelchair/BSC Sit to Stand: Min guard Stand pivot transfers: Min guard         General transfer comment: Min guard to stand and transfer to recliner with RW -  needed verbal cue to stand, take step. Had another episode of incontinence of urine and minimal BM with activity.     Balance Overall balance assessment: Needs assistance Sitting-balance support: No upper extremity supported;Feet supported Sitting balance-Leahy Scale: Fair     Standing balance support: Reliant on assistive device for balance Standing balance-Leahy Scale: Poor                             ADL either performed or assessed with clinical judgement   ADL Overall ADL's : Needs assistance/impaired Eating/Feeding: Set up;Sitting Eating/Feeding Details (indicate cue type and reason): set up to eat broth in chair with straw Grooming: Set up;Wash/dry face;Wash/dry hands Grooming Details (indicate cue type and reason): set up to wash face and hands in recliner - required command                     Toileting- Clothing Manipulation and Hygiene: Total assistance;Bed level Toileting - Clothing Manipulation Details (indicate cue type and reason): Patient found to be incontinenet of BM when therapist pulled the covers back - required total care at bed level to clean patient. Patinet able to assist with rolling but not with cleaning due to nature of incontinence     Functional mobility during ADLs: Minimal assistance;Rolling walker (2 wheels)      Extremity/Trunk Assessment Upper Extremity Assessment Upper Extremity Assessment: Generalized weakness            Vision   Vision Assessment?: No apparent visual deficits   Perception     Praxis      Cognition  Arousal/Alertness: Awake/alert Behavior During Therapy: Flat affect Overall Cognitive Status: No family/caregiver present to determine baseline cognitive functioning                                 General Comments: Able to follow commands. Appears to have some memory deficits. Is unaware of sitting in BM. requires cues/commands to perform each part of task. May be somewhat HOH as  well.          Exercises     Shoulder Instructions       General Comments      Pertinent Vitals/ Pain       Pain Assessment: No/denies pain  Home Living                                          Prior Functioning/Environment              Frequency  Min 2X/week        Progress Toward Goals  OT Goals(current goals can now be found in the care plan section)  Progress towards OT goals: Progressing toward goals  Acute Rehab OT Goals OT Goal Formulation: Patient unable to participate in goal setting Potential to Achieve Goals: Good  Plan Discharge plan remains appropriate    Co-evaluation                 AM-PAC OT "6 Clicks" Daily Activity     Outcome Measure   Help from another person eating meals?: A Little Help from another person taking care of personal grooming?: A Little Help from another person toileting, which includes using toliet, bedpan, or urinal?: Total Help from another person bathing (including washing, rinsing, drying)?: A Lot Help from another person to put on and taking off regular upper body clothing?: A Little Help from another person to put on and taking off regular lower body clothing?: Total 6 Click Score: 13    End of Session Equipment Utilized During Treatment: Rolling walker (2 wheels)  OT Visit Diagnosis: Unsteadiness on feet (R26.81);Other symptoms and signs involving cognitive function;History of falling (Z91.81);Muscle weakness (generalized) (M62.81);Pain   Activity Tolerance Patient tolerated treatment well   Patient Left in chair;with call bell/phone within reach;with chair alarm set;with nursing/sitter in room   Nurse Communication Mobility status (okay to see)        Time: 5188-4166 OT Time Calculation (min): 23 min  Charges: OT General Charges $OT Visit: 1 Visit OT Treatments $Self Care/Home Management : 8-22 mins  Derl Barrow, OTR/L [place]  Office [phone] Pager:  [place] 03/12/2021, 8:52 AM

## 2021-03-12 NOTE — Progress Notes (Signed)
Physical Therapy Treatment Patient Details Name: Kathleen Jimenez MRN: [id number] DOB: 06-05-1931 Today's Date: 03/12/2021   History of Present Illness 87 yo female  s/p exploratory laparotomy for perforated duodenal ulcer on 12/16. PMH : IM nail, lumbar fusion, CKD    PT Comments    Patient received in bed, pleasant and cooperative. Able to get to/from EOB but did need Mod-maxA due to fatigue as well as up to Mod cues for sequencing/precautions. Made multiple attempts at convincing her to transfer or practice gait but unsuccessful- very fatigued this afternoon, did participate in sitting BLE exercises at EOB however often started closing eyes/falling asleep if PT was not directly engaging with her. Left positioned to comfort in bed with all needs met, bed alarm active and family present. Will continue efforts.     Recommendations for follow up therapy are one component of a multi-disciplinary discharge planning process, led by the attending physician.  Recommendations may be updated based on patient status, additional functional criteria and insurance authorization.  Follow Up Recommendations  Skilled nursing-short term rehab (<3 hours/day)     Assistance Recommended at Discharge Frequent or constant Supervision/Assistance  Equipment Recommendations  None recommended by PT    Recommendations for Other Services       Precautions / Restrictions Precautions Precautions: Fall Precaution Comments: incontinent of urine and BM, JP drain, abdominal incision Restrictions Weight Bearing Restrictions: No     Mobility  Bed Mobility Overal bed mobility: Needs Assistance Bed Mobility: Rolling;Sidelying to Sit;Sit to Sidelying Rolling: Min assist Sidelying to sit: Max assist     Sit to sidelying: Mod assist General bed mobility comments: MinA/Mod cues for rolling, MaxA to power up to midline sitting/ModA to return to sidelying from sitting    Transfers                   General  transfer comment: refused    Ambulation/Gait               General Gait Details: refused   Stairs             Wheelchair Mobility    Modified Rankin (Stroke Patients Only)       Balance Overall balance assessment: Needs assistance Sitting-balance support: No upper extremity supported;Feet supported Sitting balance-Leahy Scale: Fair     Standing balance support: Reliant on assistive device for balance Standing balance-Leahy Scale: Poor Standing balance comment: reliant on bil HHA                            Cognition Arousal/Alertness: Awake/alert Behavior During Therapy: Flat affect Overall Cognitive Status: No family/caregiver present to determine baseline cognitive functioning                                 General Comments: able to follow commands, definite STM deficits. Does benefit from step by step commands        Exercises      General Comments        Pertinent Vitals/Pain Pain Assessment: Faces Faces Pain Scale: Hurts little more Pain Location: Ex Lap site Pain Descriptors / Indicators: Aching;Discomfort Pain Intervention(s): Limited activity within patient's tolerance;Monitored during session    Home Living                          Prior Function  PT Goals (current goals can now be found in the care plan section) Acute Rehab PT Goals Patient Stated Goal: to feel better PT Goal Formulation: With patient Time For Goal Achievement: 03/22/21 Potential to Achieve Goals: Good Progress towards PT goals: Progressing toward goals (slowly)    Frequency    Min 3X/week      PT Plan Current plan remains appropriate    Co-evaluation              AM-PAC PT "6 Clicks" Mobility   Outcome Measure  Help needed turning from your back to your side while in a flat bed without using bedrails?: A Little Help needed moving from lying on your back to sitting on the side of a flat bed  without using bedrails?: A Lot Help needed moving to and from a bed to a chair (including a wheelchair)?: A Lot Help needed standing up from a chair using your arms (e.g., wheelchair or bedside chair)?: A Lot Help needed to walk in hospital room?: Total Help needed climbing 3-5 steps with a railing? : Total 6 Click Score: 11    End of Session   Activity Tolerance: Patient limited by fatigue Patient left: in bed;with call bell/phone within reach;with bed alarm set;with family/visitor present Nurse Communication: Mobility status PT Visit Diagnosis: Other abnormalities of gait and mobility (R26.89);Muscle weakness (generalized) (M62.81);Difficulty in walking, not elsewhere classified (R26.2)     Time: 1886-7737 PT Time Calculation (min) (ACUTE ONLY): 26 min  Charges:  $Therapeutic Activity: 23-37 mins                    Windell Norfolk, DPT, PN2   Supplemental Physical Therapist [place]    Pager [phone] Acute Rehab Office [phone]

## 2021-03-12 NOTE — TOC Progression Note (Signed)
Transition of Care [facility]) - Progression Note    Patient Details  Name: Kathleen Jimenez MRN: [id number] Date of Birth: 05-28-1931  [facility]) CM/SW Contact  Joaquin Courts, RN Phone Number: 03/12/2021, 4:33 PM  Clinical Narrative:    CM outreached to patient's son, Sherren Mocha, to discuss discharge planning. Son reports he is currently in the emergency room with his daughter and requests TOC call back at another time.  TOC to call back in AM for discharge planning discussion.   Expected Discharge Plan: Skilled Nursing Facility Barriers to Discharge: No Barriers Identified  Expected Discharge Plan and Services Expected Discharge Plan: [place]                                               Social Determinants of Health (SDOH) Interventions    Readmission Risk Interventions No flowsheet data found.

## 2021-03-12 NOTE — Progress Notes (Signed)
PHARMACY - TOTAL PARENTERAL NUTRITION CONSULT NOTE   Indication:  Intolerance to enteral feeding  Patient Measurements: Height: 5\' 3"  (160 cm) Weight: 52.5 kg (115 lb 11.9 oz) IBW/kg (Calculated) : 52.4 TPN AdjBW (KG): 48.1 Body mass index is 20.5 kg/m. Usual Weight:   Assessment: 87 yo female with PMH hypertension, carotid stenosis, chronic renal insuffiencey & anemia admitted on 12/16 with abdominal pain.  CT revealed perforated viscus and was taken to the OR for gastric repair of perforated duodenal ulcer with washout and placement of intra-abdominal drain.  Pharmacy is consulted to start TPN on 12/20.  Glucose / Insulin: No hx DM.  CBGs low 12/19, improved with addition D5 CBGs currently < 150, 7 units/24 hr, dextrose removed from IV fluid Electrolytes: K 4.1, Cl improved with max acetate in TPN, CO2 improved to 24. 12/21 received total Albumin 100gm, Albumin 2.9 > 3.5 with CorrCa 8.8 Renal: Hx CKD, SCr > baseline - improving, was oliguric 12/20. BUN elevated (35) Hepatic: AST/ALT elevated on 12/17 but improved to WNL, Tbili wnl, AlkPhos now low. Alb 2.9 - Albumin 25g x 8 doses received 12/20-12/21 > 3.5 Triglycerides: WNL (43 on 12/21) Prealb: 8.5 on 12/19 Intake / Output: Net I/O + 800 ml , UOP 87ml/24h, NG fell out 12/20, drain 717ml/24hr -decreased MIVF: 1/2NS at 25 ml/hr GI Imaging: - 12/16 CT abd Moderate free air with mild-to-moderate free fluid in the abdomen. Findings consistent with perforated hollow viscus.  GI Surgeries / Procedures:  - 12/17 exploratory laparotomy and graham patch repair for perforated duodenal ulcer 12/21 Informed by RN that TPN disconnected in Radiology this AM. Will hang D10W at 61ml/hr until 1800 when new TPN starts per protocol  Central access: PICC placed 12/20 TPN start date: 12/20  Nutritional Goals: Goal TPN rate is 70 mL/hr (provides 84 g of protein and 1612 kcals per day)  RD Assessment: pending Estimated Needs Total Energy  Estimated Needs: 1500-1700 kcal Total Protein Estimated Needs: 75-90 grams Total Fluid Estimated Needs: >/= 1.7 L/day  Current Nutrition:  Bariatric CL diet started 12/21 am > adv to [facility] diet 12/23  Plan:   Continue TPN at 21mL/hr at 1800 This will provide  60 g protein, 1150 kcal per 24hr Electrolytes in TPN:  Na 66mEq/L,  K to 45 mEq/L Ca 78mEq/L,  Mg 52mEq/L,  Phos 56mmol/L.  Cl:Ac max acetate Add standard MVI and trace elements to TPN CBG q4h, initiate sensitive SSI and adjust as needed  Cont MIVF of 1/2 NS at 25 mL/hr  Monitor TPN labs on Mon/Thurs Bmet in am 12/24   [name] PharmD [facility] [phone] 03/12/2021, 7:06 AM

## 2021-03-12 NOTE — Progress Notes (Signed)
Progress Note  7 Days Post-Op  Subjective: Pt states she slept better last night.  States she is tolerating clears and starting to get more hungry Objective: Vital signs in last 24 hours: Temp:  [97.9 F (36.6 C)-98.2 F (36.8 C)] 98.2 F (36.8 C) (12/23 0537) Pulse Rate:  [45-72] 72 (12/23 0615) Resp:  [12-23] 16 (12/23 0537) BP: (143-175)/(56-87) 171/72 (12/23 0615) SpO2:  [86 %-100 %] 95 % (12/23 0537) Last BM Date: 03/11/21  Intake/Output from previous day: 12/22 0701 - 12/23 0700 In: 2132.1 [P.O.:120; I.V.:1812.3; IV Piggyback:199.8] Out: 1480 [Urine:800; Drains:680] Intake/Output this shift: No intake/output data recorded.  PE: General: pleasant, WD, frail female who is laying in bed in NAD HEENT: sclera anicteric Heart: regular, rate, and rhythm.   Lungs: CTAB, no wheezes, rhonchi, or rales noted.  Respiratory effort nonlabored Abd: soft, appropriately ttp, mild distention, +BS, JP in RLQ with serous fluid, midline with some fibrinous looking exudate but no surrounding cellulitis and no purulent appearing drainage    Lab Results:  Recent Labs    03/10/21 0248 03/11/21 0256  WBC 9.1 11.0*  HGB 7.6* 7.8*  HCT 24.5* 23.8*  PLT 154 145*    BMET Recent Labs    03/11/21 0256 03/12/21 0456  NA 136 139  K 5.1 4.1  CL 110 109  CO2 19* 24  GLUCOSE 144* 136*  BUN 35* 38*  CREATININE 1.91* 1.63*  CALCIUM 8.3* 8.4*    PT/INR No results for input(s): LABPROT, INR in the last 72 hours. CMP     Component Value Date/Time   NA 139 03/12/2021 0456   K 4.1 03/12/2021 0456   CL 109 03/12/2021 0456   CO2 24 03/12/2021 0456   GLUCOSE 136 (H) 03/12/2021 0456   BUN 38 (H) 03/12/2021 0456   CREATININE 1.63 (H) 03/12/2021 0456   CALCIUM 8.4 (L) 03/12/2021 0456   PROT 5.1 (L) 03/11/2021 0256   ALBUMIN 3.5 03/11/2021 0256   AST 22 03/11/2021 0256   ALT 15 03/11/2021 0256   ALKPHOS 30 (L) 03/11/2021 0256   BILITOT 1.1 03/11/2021 0256   GFRNONAA 30 (L)  03/12/2021 0456   GFRAA 26 (L) 05/20/2017 0433   Lipase     Component Value Date/Time   LIPASE 108 (H) 03/05/2021 1949       Studies/Results: DG UGI W SINGLE CM (SOL OR THIN BA)  Result Date: 03/10/2021 CLINICAL DATA:  History of laparotomy for perforated duodenal ulcer on 12/16 of 2022. EXAM: WATER SOLUBLE UPPER GI SERIES TECHNIQUE: Single-column upper GI series was performed using water soluble contrast. CONTRAST:  100 cc Omnipaque 300 COMPARISON:  CT of 03/05/2021 FLUOROSCOPY TIME:  Fluoroscopy Time:  3 minutes 18 seconds Radiation Exposure Index (if provided by the fluoroscopic device): 40.5 mGy Number of Acquired Spot Images: 7 FINDINGS: Scout film first obtained showing a drain projecting over the mid and lower abdomen. Signs of spinal fusion which are associated with L2 through S1 spinal fusion and project over the central abdomen and area of interest on some images. Signs of bilateral femoral ORIF partially visualized.  Osteopenia. Water-soluble contrast was first administered in the RIGHT posterior oblique position showing passage from the esophagus into the stomach and beyond into the small bowel total of 50 cc administered initially. Limited esophageal and gastric evaluation was unremarkable. Contrast passing readily into the duodenal bulb and first portion of the duodenum without visible leak. Note that contrast beyond this level became quickly diluted and did not opacify more  distal loops as readily or completely likely due to some dilation or ileus. Descending duodenum the level of the 2nd-3rd portion of the duodenum with normal appearance. Beyond this the duodenum shows limited assessment. LPO positioning was then performed with swallowing of the second 50 cc dose of contrast. Mild irregularity of the proximal duodenum without visible leak. Repeat AP and RPO images were also obtained to allow for further passage of contrast. No signs of contrast entering the surgical drain. IMPRESSION:  Mild irregularity of the proximal duodenum without visible leak on this water-soluble evaluation. Duodenum beyond the second portion of the duodenum with limited assessment due to limited opacification perhaps due to mild dilation and fluid-filled nature of bowel beyond this level. Electronically Signed   By: Zetta Bills M.D.   On: 03/10/2021 09:45    Anti-infectives: Anti-infectives (From admission, onward)    Start     Dose/Rate Route Frequency Ordered Stop   03/06/21 0400  piperacillin-tazobactam (ZOSYN) IVPB 2.25 g        2.25 g 100 mL/hr over 30 Minutes Intravenous Every 6 hours 03/06/21 0216 03/13/21 0359   03/05/21 2115  piperacillin-tazobactam (ZOSYN) IVPB 3.375 g        3.375 g 100 mL/hr over 30 Minutes Intravenous  Once 03/05/21 2101 03/05/21 2203        Assessment/Plan Perforated duodenal ulcer S/P ex-lap with graham patch repair 03/05/21 Dr. Marlou Starks - POD 7 - UGI without leak  - will try fulls today - continue to mobilize with PT/OT - monitor JP output  - will stop IV abx, recheck wbc in AM - PPI  BID IV - H pylori is negative   FEN: bari clears, TPN VTE: LMWH ID: Zosyn 12/16>>12/23  HTN HLD CKD stage IV - Cr trending down today, UOP appears increased for last 24 hrs, baseline reportedly between 1.9 and 2.5 Carotid stenosis  Chronic anemia - hgb 7.8, continue to monitor  Moderate protein calorie malnutrition - TPN as listed above  Physical deconditioning   Transfer to 3E  Will obtain TRH consult for medical management   LOS: 7 days    Rosario Adie, [facility] 03/12/2021, 8:01 AM Please see Amion for pager number during day hours 7:00am-4:30pm

## 2021-03-13 ENCOUNTER — Inpatient Hospital Stay (HOSPITAL_COMMUNITY): Payer: PPO

## 2021-03-13 DIAGNOSIS — J90 Pleural effusion, not elsewhere classified: Secondary | ICD-10-CM | POA: Diagnosis not present

## 2021-03-13 DIAGNOSIS — E440 Moderate protein-calorie malnutrition: Secondary | ICD-10-CM | POA: Diagnosis not present

## 2021-03-13 DIAGNOSIS — K265 Chronic or unspecified duodenal ulcer with perforation: Secondary | ICD-10-CM | POA: Diagnosis not present

## 2021-03-13 DIAGNOSIS — J9600 Acute respiratory failure, unspecified whether with hypoxia or hypercapnia: Secondary | ICD-10-CM | POA: Diagnosis not present

## 2021-03-13 DIAGNOSIS — R198 Other specified symptoms and signs involving the digestive system and abdomen: Secondary | ICD-10-CM | POA: Diagnosis not present

## 2021-03-13 DIAGNOSIS — J9811 Atelectasis: Secondary | ICD-10-CM | POA: Diagnosis not present

## 2021-03-13 LAB — CBC
HCT: 26.1 % — ABNORMAL LOW (ref 36.0–46.0)
Hemoglobin: 8.5 g/dL — ABNORMAL LOW (ref 12.0–15.0)
MCH: 31.8 pg (ref 26.0–34.0)
MCHC: 32.6 g/dL (ref 30.0–36.0)
MCV: 97.8 fL (ref 80.0–100.0)
Platelets: 148 10*3/uL — ABNORMAL LOW (ref 150–400)
RBC: 2.67 MIL/uL — ABNORMAL LOW (ref 3.87–5.11)
RDW: 13.2 % (ref 11.5–15.5)
WBC: 15 10*3/uL — ABNORMAL HIGH (ref 4.0–10.5)
nRBC: 0 % (ref 0.0–0.2)

## 2021-03-13 LAB — GLUCOSE, CAPILLARY
Glucose-Capillary: 120 mg/dL — ABNORMAL HIGH (ref 70–99)
Glucose-Capillary: 128 mg/dL — ABNORMAL HIGH (ref 70–99)
Glucose-Capillary: 138 mg/dL — ABNORMAL HIGH (ref 70–99)
Glucose-Capillary: 131 mg/dL — ABNORMAL HIGH (ref 70–99)
Glucose-Capillary: 141 mg/dL — ABNORMAL HIGH (ref 70–99)
Glucose-Capillary: 142 mg/dL — ABNORMAL HIGH (ref 70–99)

## 2021-03-13 LAB — URINALYSIS, ROUTINE W REFLEX MICROSCOPIC
Bilirubin Urine: NEGATIVE
Glucose, UA: NEGATIVE mg/dL
Hgb urine dipstick: NEGATIVE
Ketones, ur: NEGATIVE mg/dL
Leukocytes,Ua: NEGATIVE
Nitrite: NEGATIVE
Protein, ur: 30 mg/dL — AB
Specific Gravity, Urine: 1.015 (ref 1.005–1.030)
pH: 6 (ref 5.0–8.0)

## 2021-03-13 LAB — BASIC METABOLIC PANEL WITH GFR
Anion gap: 4 — ABNORMAL LOW (ref 5–15)
BUN: 40 mg/dL — ABNORMAL HIGH (ref 8–23)
CO2: 26 mmol/L (ref 22–32)
Calcium: 8.3 mg/dL — ABNORMAL LOW (ref 8.9–10.3)
Chloride: 108 mmol/L (ref 98–111)
Creatinine, Ser: 1.62 mg/dL — ABNORMAL HIGH (ref 0.44–1.00)
GFR, Estimated: 31 mL/min — ABNORMAL LOW
Glucose, Bld: 125 mg/dL — ABNORMAL HIGH (ref 70–99)
Potassium: 4.1 mmol/L (ref 3.5–5.1)
Sodium: 138 mmol/L (ref 135–145)

## 2021-03-13 MED ORDER — DIPHENHYDRAMINE HCL 12.5 MG/5ML PO ELIX
12.5000 mg | ORAL_SOLUTION | Freq: Every evening | ORAL | Status: DC | PRN
  Administered 2021-03-13: 22:00:00 12.5 mg via ORAL
  Filled 2021-03-13: qty 5

## 2021-03-13 MED ORDER — INSULIN ASPART 100 UNIT/ML IJ SOLN
0.0000 [IU] | Freq: Three times a day (TID) | INTRAMUSCULAR | Status: DC
  Administered 2021-03-13 – 2021-03-16 (×10): 1 [IU] via SUBCUTANEOUS

## 2021-03-13 MED ORDER — PANTOPRAZOLE SODIUM 40 MG PO TBEC
40.0000 mg | DELAYED_RELEASE_TABLET | Freq: Two times a day (BID) | ORAL | Status: DC
  Administered 2021-03-13 – 2021-03-30 (×34): 40 mg via ORAL
  Filled 2021-03-13 (×33): qty 1

## 2021-03-13 MED ORDER — TRAVASOL 10 % IV SOLN
INTRAVENOUS | Status: AC
  Filled 2021-03-13: qty 600

## 2021-03-13 NOTE — Progress Notes (Signed)
I/O cath for cc urine, pt had 1000 cc's amber urine in her bladder, cath was pulled out when 1000 was reached. Tol procedure well.

## 2021-03-13 NOTE — Progress Notes (Signed)
PROGRESS NOTE    Kathleen Jimenez  OIZ:[id number] DOB: 1931/12/22 DOA: 03/05/2021 PCP: Ginger Organ., MD    Brief Narrative:  Medical consultation for inpatient management of CKD stage IV, HTN, and dyslipidemia    Patient admitted to the hospital with the working diagnosis of perforated viscus to the surgery team.  87 yo female who presented with 1 day of abdominal pain, work up in the ED showed perforated viscus and she was taken urgently to the operating room.    12/16 exploratory laparotomy    She was found to have a perforated duodenal ulcer, she underwent washout and intra-abdominal drain.  Placed on broad spectrum antibiotic therapy and transferred to the step down unit.    Patient developed agitation and delirium, and required precedex infusion.  She had NG tube post operative that was removed on 12/20.    Transferred to the med surg unit on 12/22.   Her medical problems have remained stable.   Assessment & Plan:   Principal Problem:   Perforated duodenal ulcer ([place]za[place] II) Active Problems:   Acute respiratory failure ([place])   Perforated abdominal viscus   Malnutrition of moderate degree     Perforated duodenal ulcer with peritonitis sp exploratory laparotomy and repair.  Continue with TPN for nutritional support. Continue to encourage po intake, she continue to be very weak and deconditioned.  Wbc is 15.0  Getting daily albumin for 7 days, IV.   Antibiotic therapy has been discontinued.  Continue to follow up on wbc.  Will change pantoprazole from IV to po.    2. Acute post operative anemia, on chronic anemia  Hgb is 8,5 and hct 26.1. Close follow up on cell count closely.   3. AKI on CKD stage IV  Stable renal function and electrolytes, with serum cr at 1,62, K is 4,1 and serum bicarbonate is 26.     4. HTN  Blood pressure has been stable.     5. Dyslipidemia  On statin.    6. Moderate calorie protein malnutrition/ stage 1 sacrum pressure ulcer  On TPN  and oral nutritional supplementation    7. Acute metabolic encephalopathy with delirium.  Patient very weak and deconditioned, she has been forgetful but no agitation,      Status is: Inpatient  Remains inpatient appropriate because: post operative care   DVT prophylaxis: Enoxaparin   Code Status:    full  Family Communication:   No family at the bedside      Nutrition Status: Nutrition Problem: Moderate Malnutrition Etiology: chronic illness Signs/Symptoms: moderate fat depletion, severe muscle depletion, mild muscle depletion Interventions: TPN     Skin Documentation: Pressure Injury 03/06/21 Sacrum Mid Stage 1 -  Intact skin with non-blanchable redness of a localized area usually over a bony prominence. skin appears reddened, scabbed and does not blanch (Active)  03/06/21 0131  Location: Sacrum  Location Orientation: Mid  Staging: Stage 1 -  Intact skin with non-blanchable redness of a localized area usually over a bony prominence.  Wound Description (Comments): skin appears reddened, scabbed and does not blanch  Present on Admission: Yes      Subjective: Patient continue to be very weak and deconditioned, no nausea or vomiting, no chest pain or dyspnea.   Objective: Vitals:   03/12/21 0615 03/12/21 1352 03/12/21 2134 03/13/21 0549  BP: (!) 171/72 (!) 151/70 (!) 146/84 (!) 153/56  Pulse: 72 66 63 (!) 55  Resp:  14 14 16   Temp:  98.4 F (  36.9 C) 97.7 F (36.5 C) 98.7 F (37.1 C)  TempSrc:  Oral Oral Oral  SpO2:  97% 98% 97%  Weight:      Height:        Intake/Output Summary (Last 24 hours) at 03/13/2021 1302 Last data filed at 03/13/2021 1000 Gross per 24 hour  Intake 1310.77 ml  Output 1560 ml  Net -249.23 ml   Filed Weights   03/06/21 0131 03/09/21 0918 03/10/21 0600  Weight: 48.1 kg 50.3 kg 52.5 kg    Examination:   General: Not in pain or dyspnea. Deconditioned  Neurology: Awake and alert, non focal  E ENT: mild pallor, no icterus, oral  mucosa moist Cardiovascular: No JVD. S1-S2 present, rhythmic, no gallops, rubs, or murmurs. No lower extremity edema. Pulmonary: positive breath sounds bilaterally, adequate air movement, no wheezing, rhonchi or rales. Gastrointestinal. Abdomen soft, wound with dressing in place.  Skin. No rashes Musculoskeletal: no joint deformities     Data Reviewed: I have personally reviewed following labs and imaging studies  CBC: Recent Labs  Lab 03/08/21 0856 03/09/21 0257 03/10/21 0248 03/11/21 0256 03/13/21 0419  WBC 17.6* 14.3* 9.1 11.0* 15.0*  HGB 8.7* 8.2* 7.6* 7.8* 8.5*  HCT 26.4* 25.8* 24.5* 23.8* 26.1*  MCV 99.2 101.2* 102.5* 98.3 97.8  PLT 220 198 154 145* 557*   Basic Metabolic Panel: Recent Labs  Lab 03/07/21 0802 03/08/21 0856 03/09/21 0257 03/10/21 0248 03/11/21 0256 03/12/21 0456 03/13/21 0419  NA 134*   < > 140 137 136 139 138  K 4.8   < > 3.7 3.6 5.1 4.1 4.1  CL 107   < > 115* 111 110 109 108  CO2 19*   < > 18* 19* 19* 24 26  GLUCOSE 72   < > 152* 203* 144* 136* 125*  BUN 45*   < > 41* 38* 35* 38* 40*  CREATININE 2.31*   < > 2.54* 2.19* 1.91* 1.63* 1.62*  CALCIUM 7.8*   < > 7.9* 8.0* 8.3* 8.4* 8.3*  MG 2.0  --  2.1 2.0 1.9  --   --   PHOS  --   --  3.8 2.9 3.0  --   --    < > = values in this interval not displayed.   GFR: Estimated Creatinine Clearance: 20.2 mL/min (A) (by C-G formula based on SCr of 1.62 mg/dL (H)). Liver Function Tests: Recent Labs  Lab 03/10/21 0248 03/11/21 0256  AST 17 22  ALT 22 15  ALKPHOS 38 30*  BILITOT 0.8 1.1  PROT 4.9* 5.1*  ALBUMIN 2.9* 3.5   No results for input(s): LIPASE, AMYLASE in the last 168 hours. No results for input(s): AMMONIA in the last 168 hours. Coagulation Profile: No results for input(s): INR, PROTIME in the last 168 hours. Cardiac Enzymes: No results for input(s): CKTOTAL, CKMB, CKMBINDEX, TROPONINI in the last 168 hours. BNP (last 3 results) No results for input(s): PROBNP in the last 8760  hours. HbA1C: No results for input(s): HGBA1C in the last 72 hours. CBG: Recent Labs  Lab 03/12/21 2004 03/12/21 2346 03/13/21 0344 03/13/21 0753 03/13/21 1146  GLUCAP 128* 113* 120* 128* 138*   Lipid Profile: No results for input(s): CHOL, HDL, LDLCALC, TRIG, CHOLHDL, LDLDIRECT in the last 72 hours. Thyroid Function Tests: No results for input(s): TSH, T4TOTAL, FREET4, T3FREE, THYROIDAB in the last 72 hours. Anemia Panel: No results for input(s): VITAMINB12, FOLATE, FERRITIN, TIBC, IRON, RETICCTPCT in the last 72 hours.    Radiology  Studies: I have reviewed all of the imaging during this hospital visit personally     Scheduled Meds:  chlorhexidine  15 mL Mouth Rinse BID   Chlorhexidine Gluconate Cloth  6 each Topical Daily   enoxaparin (LOVENOX) injection  30 mg Subcutaneous Q24H   insulin aspart  0-9 Units Subcutaneous TID WC   mouth rinse  15 mL Mouth Rinse q12n4p   pantoprazole (PROTONIX) IV  40 mg Intravenous Q12H   sodium chloride flush  10-40 mL Intracatheter Q12H   Continuous Infusions:  albumin human 12.5 g (03/13/21 1051)   TPN ADULT (ION) 50 mL/hr at 03/13/21 0328   TPN ADULT (ION)       LOS: 8 days        Haset [place] Gerome Apley, MD

## 2021-03-13 NOTE — Progress Notes (Signed)
Progress Note  8 Days Post-Op  Subjective: Tolerating Fulls Objective: Vital signs in last 24 hours: Temp:  [97.7 F (36.5 C)-98.7 F (37.1 C)] 98.7 F (37.1 C) (12/24 0549) Pulse Rate:  [55-66] 55 (12/24 0549) Resp:  [14-16] 16 (12/24 0549) BP: (146-153)/(56-84) 153/56 (12/24 0549) SpO2:  [97 %-98 %] 97 % (12/24 0549) Last BM Date: 03/12/21  Intake/Output from previous day: 12/23 0701 - 12/24 0700 In: 1620.7 [P.O.:410; I.V.:1060.7; IV Piggyback:150] Out: 1700 [Urine:1100; Drains:600] Intake/Output this shift: No intake/output data recorded.  PE: General: pleasant, WD, frail female who is laying in bed in NAD HEENT: sclera anicteric Heart: regular, rate, and rhythm.   Lungs: Respiratory effort nonlabored Abd: soft, non-tender to palpation, no distention, +BS, JP in RLQ with serous fluid, midline with some fibrinous looking exudate but no surrounding cellulitis and no purulent appearing drainage    Lab Results:  Recent Labs    03/11/21 0256 03/13/21 0419  WBC 11.0* 15.0*  HGB 7.8* 8.5*  HCT 23.8* 26.1*  PLT 145* 148*    BMET Recent Labs    03/12/21 0456 03/13/21 0419  NA 139 138  K 4.1 4.1  CL 109 108  CO2 24 26  GLUCOSE 136* 125*  BUN 38* 40*  CREATININE 1.63* 1.62*  CALCIUM 8.4* 8.3*    PT/INR No results for input(s): LABPROT, INR in the last 72 hours. CMP     Component Value Date/Time   NA 138 03/13/2021 0419   K 4.1 03/13/2021 0419   CL 108 03/13/2021 0419   CO2 26 03/13/2021 0419   GLUCOSE 125 (H) 03/13/2021 0419   BUN 40 (H) 03/13/2021 0419   CREATININE 1.62 (H) 03/13/2021 0419   CALCIUM 8.3 (L) 03/13/2021 0419   PROT 5.1 (L) 03/11/2021 0256   ALBUMIN 3.5 03/11/2021 0256   AST 22 03/11/2021 0256   ALT 15 03/11/2021 0256   ALKPHOS 30 (L) 03/11/2021 0256   BILITOT 1.1 03/11/2021 0256   GFRNONAA 31 (L) 03/13/2021 0419   GFRAA 26 (L) 05/20/2017 0433   Lipase     Component Value Date/Time   LIPASE 108 (H) 03/05/2021 1949        Studies/Results: No results found.  Anti-infectives: Anti-infectives (From admission, onward)    Start     Dose/Rate Route Frequency Ordered Stop   03/06/21 0400  piperacillin-tazobactam (ZOSYN) IVPB 2.25 g        2.25 g 100 mL/hr over 30 Minutes Intravenous Every 6 hours 03/06/21 0216 03/12/21 1707   03/05/21 2115  piperacillin-tazobactam (ZOSYN) IVPB 3.375 g        3.375 g 100 mL/hr over 30 Minutes Intravenous  Once 03/05/21 2101 03/05/21 2203        Assessment/Plan Perforated duodenal ulcer S/P ex-lap with graham patch repair 03/05/21 Dr. Marlou Starks - POD 8 - UGI without leak  - will try fulls today - continue to mobilize with PT/OT - monitor JP output  - will stop IV abx, recheck wbc in AM - PPI  BID IV - H pylori is negative   FEN: bari clears, TPN VTE: LMWH ID: Zosyn 12/16>>12/23, wbc 15 today, will check UA and CXR, if these are neg, will proceed with non-contrast CT scan  HTN: cont home meds, appreciate TRH assistance HLD CKD stage IV - Cr trending down today, UOP appears increased for last 24 hrs, baseline reportedly between 1.9 and 2.5 Carotid stenosis  Chronic anemia - hgb 8.5, continue to monitor  Moderate protein calorie malnutrition -  TPN as listed above  Physical deconditioning      LOS: 8 days    Rosario Adie, [facility] 03/13/2021, 8:02 AM Please see Amion for pager number during day hours 7:00am-4:30pm

## 2021-03-13 NOTE — Plan of Care (Signed)
°  Problem: Education: Goal: Knowledge of General Education information will improve Description: Including pain rating scale, medication(s)/side effects and non-pharmacologic comfort measures Outcome: Progressing   Problem: Clinical Measurements: Goal: Will remain free from infection Outcome: Progressing   Problem: Clinical Measurements: Goal: Diagnostic test results will improve Outcome: Progressing   Problem: Coping: Goal: Level of anxiety will decrease Outcome: Progressing   Problem: Nutrition: Goal: Adequate nutrition will be maintained Outcome: Progressing

## 2021-03-13 NOTE — TOC Progression Note (Signed)
Transition of Care [facility]) - Progression Note   Patient Details  Name: Kathleen Jimenez MRN: [id number] Date of Birth: [birth year]  Transition of Care [facility]) CM/SW Otho, LCSW Phone Number: 03/13/2021, 11:18 AM  Clinical Narrative: CSW spoke with patient's son, Evelin Cake, to discuss SNF recommendations. Son is agreeable to patient discharging to SNF, but her insurance will change from HTA to [facility] Medicare effective 03/21/21.  Patient is still on TPN, but FL2 was started and PASRR was received. TOC to follow.  Expected Discharge Plan: Skilled Nursing Facility Barriers to Discharge: SNF Pending bed offer, Insurance Authorization  Expected Discharge Plan and Services Expected Discharge Plan: [place] In-house Referral: Clinical Social Work Post Acute Care Choice: [place] Living arrangements for the past 2 months: [place]            DME Arranged: N/A DME Agency: NA  Readmission Risk Interventions Readmission Risk Prevention Plan 03/12/2021  Transportation Screening Complete  HRI or Home Care Consult Complete  Social Work Consult for [place] Planning/Counseling Complete  Palliative Care Screening Not Applicable  Medication Review Press photographer) Complete  Some recent data might be hidden

## 2021-03-13 NOTE — Progress Notes (Addendum)
PHARMACY - TOTAL PARENTERAL NUTRITION CONSULT NOTE   Indication:  Intolerance to enteral feeding  Patient Measurements: Height: 5\' 3"  (160 cm) Weight: 52.5 kg (115 lb 11.9 oz) IBW/kg (Calculated) : 52.4 TPN AdjBW (KG): 48.1 Body mass index is 20.5 kg/m. Usual Weight:   Assessment: 87 yo female with PMH hypertension, carotid stenosis, chronic renal insuffiencey & anemia admitted on 12/16 with abdominal pain.  CT revealed perforated viscus and was taken to the OR for gastric repair of perforated duodenal ulcer with washout and placement of intra-abdominal drain.  Pharmacy is consulted to start TPN on 12/20.  Glucose / Insulin: No hx DM.  CBGs were low 12/19, improved with addition D5 > removed. CBGs currently < 150, 4 units/24 hr, sSSI q4 > tidac Electrolytes: Lytes wnl, Cl & CO2 wnl with max acetate Renal: Hx CKD, SCr stable 1.6 (below baseline 1.9), BUN elevated (40) Hepatic: AST/ALT elevated on 12/17 but improved to WNL, Tbili wnl, AlkPhos now low. Alb 2.9 - Albumin 25g x 8 doses received 12/20-12/21 > 3.5, Rpt Albumin Triglycerides: WNL (43 on 12/21) Prealb: 8.5 on 12/19 Intake / Output: Net I/O -13ml , UOP 1.1L/24h, NG fell out 12/20, drain 798ml/24hr -decreased MIVF: 1/2NS at 25 ml/hr GI Imaging: - 12/16 CT abd Moderate free air with mild-to-moderate free fluid in the abdomen. Findings consistent with perforated hollow viscus.  GI Surgeries / Procedures:  - 12/17 exploratory laparotomy and graham patch repair for perforated duodenal ulcer 12/21 Informed by RN that TPN disconnected in Radiology this AM. Will hang D10W at 47ml/hr until 1800 when new TPN starts per protocol  Central access: PICC placed 12/20 TPN start date: 12/20  Nutritional Goals: Goal TPN rate is 70 mL/hr (provides 84 g of protein and 1612 kcals per day)  RD Assessment: pending Estimated Needs Total Energy Estimated Needs: 1500-1700 kcal Total Protein Estimated Needs: 75-90 grams Total Fluid Estimated  Needs: >/= 1.7 L/day  Current Nutrition:  Bariatric CL diet started 12/21 am > adv to [facility] diet 12/23  Some po intake  Plan:   Continue TPN at 58mL/hr at 1800 This will provide  60 g protein, 1150 kcal per 24hr Electrolytes in TPN:  Na 50mEq/L,  K 45 mEq/L Ca 58mEq/L,  Mg 38mEq/L,  Phos 37mmol/L.  Cl:Ac adj to 1:2 Add standard MVI and trace elements to TPN CBG and sensitive SSI reduced to tidac Cont MIVF of 1/2 NS at 25 mL/hr  Monitor TPN labs on Mon/Thurs BMET in am 12/25  [name] PharmD [facility] Rx [phone] 03/13/2021, 8:06 AM

## 2021-03-14 DIAGNOSIS — E440 Moderate protein-calorie malnutrition: Secondary | ICD-10-CM | POA: Diagnosis not present

## 2021-03-14 DIAGNOSIS — R198 Other specified symptoms and signs involving the digestive system and abdomen: Secondary | ICD-10-CM | POA: Diagnosis not present

## 2021-03-14 DIAGNOSIS — K265 Chronic or unspecified duodenal ulcer with perforation: Secondary | ICD-10-CM | POA: Diagnosis not present

## 2021-03-14 DIAGNOSIS — J9600 Acute respiratory failure, unspecified whether with hypoxia or hypercapnia: Secondary | ICD-10-CM | POA: Diagnosis not present

## 2021-03-14 LAB — CBC
HCT: 26 % — ABNORMAL LOW (ref 36.0–46.0)
Hemoglobin: 8.4 g/dL — ABNORMAL LOW (ref 12.0–15.0)
MCH: 31.6 pg (ref 26.0–34.0)
MCHC: 32.3 g/dL (ref 30.0–36.0)
MCV: 97.7 fL (ref 80.0–100.0)
Platelets: 142 10*3/uL — ABNORMAL LOW (ref 150–400)
RBC: 2.66 MIL/uL — ABNORMAL LOW (ref 3.87–5.11)
RDW: 13.2 % (ref 11.5–15.5)
WBC: 13 10*3/uL — ABNORMAL HIGH (ref 4.0–10.5)
nRBC: 0 % (ref 0.0–0.2)

## 2021-03-14 LAB — GLUCOSE, CAPILLARY
Glucose-Capillary: 122 mg/dL — ABNORMAL HIGH (ref 70–99)
Glucose-Capillary: 134 mg/dL — ABNORMAL HIGH (ref 70–99)
Glucose-Capillary: 135 mg/dL — ABNORMAL HIGH (ref 70–99)
Glucose-Capillary: 139 mg/dL — ABNORMAL HIGH (ref 70–99)
Glucose-Capillary: 139 mg/dL — ABNORMAL HIGH (ref 70–99)
Glucose-Capillary: 145 mg/dL — ABNORMAL HIGH (ref 70–99)

## 2021-03-14 LAB — BASIC METABOLIC PANEL
Anion gap: 4 — ABNORMAL LOW (ref 5–15)
BUN: 43 mg/dL — ABNORMAL HIGH (ref 8–23)
CO2: 26 mmol/L (ref 22–32)
Calcium: 8.3 mg/dL — ABNORMAL LOW (ref 8.9–10.3)
Chloride: 107 mmol/L (ref 98–111)
Creatinine, Ser: 1.48 mg/dL — ABNORMAL HIGH (ref 0.44–1.00)
GFR, Estimated: 34 mL/min — ABNORMAL LOW (ref >60)
Glucose, Bld: 128 mg/dL — ABNORMAL HIGH (ref 70–99)
Potassium: 4.1 mmol/L (ref 3.5–5.1)
Sodium: 137 mmol/L (ref 135–145)

## 2021-03-14 MED ORDER — HALOPERIDOL LACTATE 5 MG/ML IJ SOLN
0.5000 mg | Freq: Four times a day (QID) | INTRAMUSCULAR | Status: DC | PRN
  Administered 2021-03-23: 0.5 mg via INTRAMUSCULAR
  Filled 2021-03-14: qty 1

## 2021-03-14 MED ORDER — HALOPERIDOL 0.5 MG PO TABS
0.5000 mg | ORAL_TABLET | Freq: Four times a day (QID) | ORAL | Status: DC | PRN
  Administered 2021-03-14: 19:00:00 0.5 mg via ORAL
  Filled 2021-03-14 (×3): qty 1

## 2021-03-14 MED ORDER — TRAZODONE HCL 50 MG PO TABS
50.0000 mg | ORAL_TABLET | Freq: Every evening | ORAL | Status: DC | PRN
  Administered 2021-03-14 – 2021-03-22 (×4): 50 mg via ORAL
  Filled 2021-03-14 (×5): qty 1

## 2021-03-14 MED ORDER — TRAVASOL 10 % IV SOLN
INTRAVENOUS | Status: AC
  Filled 2021-03-14: qty 840

## 2021-03-14 NOTE — Progress Notes (Addendum)
PHARMACY - TOTAL PARENTERAL NUTRITION CONSULT NOTE   Indication:  Intolerance to enteral feeding  Patient Measurements: Height: 5\' 3"  (160 cm) Weight: 52.5 kg (115 lb 11.9 oz) IBW/kg (Calculated) : 52.4 TPN AdjBW (KG): 48.1 Body mass index is 20.5 kg/m. Usual Weight:   Assessment: 87 yo female with PMH hypertension, carotid stenosis, chronic renal insuffiencey & anemia admitted on 12/16 with abdominal pain.  CT revealed perforated viscus and was taken to the OR for gastric repair of perforated duodenal ulcer with washout and placement of intra-abdominal drain.  Pharmacy is consulted to start TPN on 12/20.  Glucose / Insulin: No hx DM.  CBGs were low 12/19, improved with addition D5 > removed. CBGs currently < 150, 3 units/24 hr, sSSI q4 > tidac Electrolytes: Lytes wnl, Cl & CO2 wnl with ratio 1:2 Renal: Hx CKD, SCr stable 1.48 (below baseline 1.9), BUN elevated (40) Hepatic: AST/ALT elevated on 12/17 but improved to WNL, Tbili wnl, AlkPhos now low. Alb 2.9 - Albumin 25g x 8 doses received 12/20-12/21 > 3.5, Rpt Albumin Triglycerides: WNL (43 on 12/21) Prealb: 8.5 on 12/19 Intake / Output: Net I/O -1.8 L , UOP 3.1 L/24h, NG pulled out 12/20, drain 411ml/24hr -decreasing MIVF: none GI Imaging: - 12/16 CT abd Moderate free air with mild-to-moderate free fluid in the abdomen. Findings consistent with perforated hollow viscus.  GI Surgeries / Procedures:  - 12/17 exploratory laparotomy and graham patch repair for perforated duodenal ulcer 12/21 Informed by RN that TPN disconnected in Radiology this AM. Will hang D10W at 87ml/hr until 1800 when new TPN starts per protocol  Central access: PICC placed 12/20 TPN start date: 12/20  Nutritional Goals: Goal TPN rate is 70 mL/hr (provides 84 g of protein and 1612 kcals per day)  RD Assessment: pending Estimated Needs Total Energy Estimated Needs: 1500-1700 kcal Total Protein Estimated Needs: 75-90 grams Total Fluid Estimated Needs: >/=  1.7 L/day  Current Nutrition:  Bariatric CL diet started 12/21 am > adv to [facility] diet 12/23  Some po intake  Plan:   Increase TPN to 67mL/hr at 1800 Electrolytes in TPN:  Na 60mEq/L,  K 45 mEq/L Ca  5 mEq/L,  Mg 50mEq/L,  Phos 52mmol/L.  Cl:Ac adj to 1:2 Add standard MVI and trace elements to TPN CBG and sensitive SSI reduced to tidac Monitor TPN labs on Mon/Thurs  [name] PharmD [facility] Rx [phone] 03/14/2021, 7:15 AM

## 2021-03-14 NOTE — Progress Notes (Signed)
Inserted # 16 Fr cath to s/d, returned 1000 cc's amber urine. Pt tol well.

## 2021-03-14 NOTE — Progress Notes (Signed)
9 Days Post-Op   Subjective/Chief Complaint: Lying in bed  Had a BM  Nurses getting her cleaned up Placing a foley No complaints    Objective: Vital signs in last 24 hours: Temp:  [97.8 F (36.6 C)-98.2 F (36.8 C)] 98.2 F (36.8 C) (12/25 0641) Pulse Rate:  [74-84] 80 (12/25 0641) Resp:  [18] 18 (12/25 0641) BP: (140-150)/(67-78) 150/67 (12/25 0641) SpO2:  [97 %-100 %] 100 % (12/25 0641) Last BM Date: 03/14/21  Intake/Output from previous day: 12/24 0701 - 12/25 0700 In: 1744.2 [P.O.:330; I.V.:1334.2; IV Piggyback:50] Out: 1700 [Urine:3500; Drains:525] Intake/Output this shift: No intake/output data recorded.   General: pleasant, WD, frail female who is laying in bed in NAD HEENT: sclera anicteric Heart: regular, rate, and rhythm.   Lungs: Respiratory effort nonlabored Abd: soft, non-tender to palpation, no distention, +BS, JP in RLQ with serous fluid, midline with some fibrinous looking exudate but no surrounding cellulitis and no purulent appearing drainage  Lab Results:  Recent Labs    03/13/21 0419 03/14/21 0301  WBC 15.0* 13.0*  HGB 8.5* 8.4*  HCT 26.1* 26.0*  PLT 148* 142*   BMET Recent Labs    03/13/21 0419 03/14/21 0301  NA 138 137  K 4.1 4.1  CL 108 107  CO2 26 26  GLUCOSE 125* 128*  BUN 40* 43*  CREATININE 1.62* 1.48*  CALCIUM 8.3* 8.3*   PT/INR No results for input(s): LABPROT, INR in the last 72 hours. ABG No results for input(s): PHART, HCO3 in the last 72 hours.  Invalid input(s): PCO2, PO2  Studies/Results: DG CHEST PORT 1 VIEW  Result Date: 03/13/2021 CLINICAL DATA:  Postoperative infection EXAM: PORTABLE CHEST 1 VIEW COMPARISON:  Portable exam 0957 hours compared to 03/06/2021 and correlated with CT chest of 03/05/2021 FINDINGS: LEFT arm PICC line tip projects over SVC. Upper normal heart size. Atherosclerotic calcifications aorta. Enlargement of RIGHT hilum, increased versus previous exam suspect related to patient rotation and  calcified hilar nodes. Tracheal deviation to the LEFT, due to known RIGHT thyroid mass/enlargement. Bibasilar pleural effusions and atelectasis greater on RIGHT. Questionable RIGHT perihilar infiltrate versus edema. Diffuse osseous demineralization. IMPRESSION: Bibasilar pleural effusions and atelectasis, greater on RIGHT. Questionable RIGHT perihilar infiltrate or edema. Aortic Atherosclerosis (ICD10-I70.0). Electronically Signed   By: Lavonia Dana M.D.   On: 03/13/2021 10:17    Anti-infectives: Anti-infectives (From admission, onward)    Start     Dose/Rate Route Frequency Ordered Stop   03/06/21 0400  piperacillin-tazobactam (ZOSYN) IVPB 2.25 g        2.25 g 100 mL/hr over 30 Minutes Intravenous Every 6 hours 03/06/21 0216 03/12/21 1707   03/05/21 2115  piperacillin-tazobactam (ZOSYN) IVPB 3.375 g        3.375 g 100 mL/hr over 30 Minutes Intravenous  Once 03/05/21 2101 03/05/21 2203       Assessment/Plan: Perforated duodenal ulcer S/P ex-lap with graham patch repair 03/05/21 Dr. Marlou Starks - POD 9 - UGI without leak  - will try fulls today - continue to mobilize with PT/OT - monitor JP output  - will stop IV abx, recheck wbc in AM - PPI  BID IV - H pylori is negative    FEN: bari clears, TPN VTE: LMWH ID: Zosyn 12/16>>12/23, wbc 15 today, will check UA and CXR, if these are neg, will proceed with non-contrast CT scan   HTN: cont home meds, appreciate TRH assistance HLD CKD stage IV - Cr trending down today, UOP appears increased for last 24  hrs, baseline reportedly between 1.9 and 2.5 Carotid stenosis  Chronic anemia - hgb 8.5, continue to monitor  Moderate protein calorie malnutrition - TPN as listed above  Physical deconditioning  Urinary retention- 1000 cc with cath yesterday Urine looks ok and CXR ? Atelectasis vs effusion vs pneumonia    Place foley for better decompression Ask hospitalist to review CXR and make any recs with respect to treatment   LOS: 9 days    Turner Daniels MD  03/14/2021

## 2021-03-14 NOTE — Progress Notes (Signed)
PROGRESS NOTE    Kathleen Jimenez  WER:[id number] DOB: 12/08/1931 DOA: 03/05/2021 PCP: Ginger Organ., MD    Brief Narrative:  Medical consultation for inpatient management of CKD stage IV, HTN, and dyslipidemia    Patient admitted to the hospital with the working diagnosis of perforated viscus to the surgery team.  87 yo female who presented with 1 day of abdominal pain, work up in the ED showed perforated viscus and she was taken urgently to the operating room.    12/16 exploratory laparotomy    She was found to have a perforated duodenal ulcer, she underwent washout and intra-abdominal drain.  Placed on broad spectrum antibiotic therapy and transferred to the step down unit.    Patient developed agitation and delirium, and required precedex infusion.  She had NG tube post operative that was removed on 12/20.    Transferred to the med surg unit on 12/22.    Her medical problems have remained stable. Patient has been very weak and deconditioned, poor oral intake.    Assessment & Plan:   Principal Problem:   Perforated duodenal ulcer ([place]) Active Problems:   Acute respiratory failure ([place])   Perforated abdominal viscus   Malnutrition of moderate degree     Perforated duodenal ulcer with peritonitis sp exploratory laparotomy and repair.  Patient with no significant abdominal pain, but continue to be very weak and deconditioned with poor oral intake.  On TPN for nutritional support. Wbc is trending down to 13 this am. Now off antibiotic therapy.    Continue with supportive medical therapy, as needed analgesics and antiemetics Continue with proton pump inhibitors po.  On IV albumin for 7 days.   Chest film from 12/24 personally reviewed noted bilateral pleural effusions small but more predominant on the right side, associated atelectasis.  Continue with incentive spirometer and nutritional supplementation.  Hold on antibiotic therapy, no clinical signs of pneumonia.     2. Acute post operative anemia, on chronic anemia  Stable cell count with Hgb at 8,4 and Hct at 26.0 Continue close monitoring    3. AKI on CKD stage IV  Renal function with serum cr at 1,48 with K at 4,1 and serum bicarbonate at 26 Continue close follow up on renal function and electrolytes.    4. HTN  Continue blood pressure monitoring, patient off antihypertensive medications due to risk of hypotension.     5. Dyslipidemia  Continue with statin therapy.    6. Moderate calorie protein malnutrition/ stage 1 sacrum pressure ulcer (present on admission)  Continue with TPN and oral nutritional supplementation    7. Acute metabolic encephalopathy with delirium.  Encephalopathy has resolved  Status is: Inpatient  Remains inpatient appropriate because: post surgical care.    DVT prophylaxis:  Enoxaparin   Code Status:    full  Family Communication:   I spoke with patient's son at the bedside, we talked in detail about patient's condition, plan of care and prognosis and all questions were addressed.      Nutrition Status: Nutrition Problem: Moderate Malnutrition Etiology: chronic illness Signs/Symptoms: moderate fat depletion, severe muscle depletion, mild muscle depletion Interventions: TPN     Skin Documentation: Pressure Injury 03/06/21 Sacrum Mid Stage 1 -  Intact skin with non-blanchable redness of a localized area usually over a bony prominence. skin appears reddened, scabbed and does not blanch (Active)  03/06/21 0131  Location: Sacrum  Location Orientation: Mid  Staging: Stage 1 -  Intact skin with non-blanchable  redness of a localized area usually over a bony prominence.  Wound Description (Comments): skin appears reddened, scabbed and does not blanch  Present on Admission: Yes    Subjective: Patient continue to be very weak and deconditioned, poor oral intake, no nausea or vomiting and abdominal pain is well controlled.   Objective: Vitals:   03/13/21  0549 03/13/21 1357 03/13/21 2014 03/14/21 0641  BP: (!) 153/56 140/67 140/78 (!) 150/67  Pulse: (!) 55 74 84 80  Resp: 16 18 18 18   Temp: 98.7 F (37.1 C) 97.8 F (36.6 C) 98 F (36.7 C) 98.2 F (36.8 C)  TempSrc: Oral Oral Oral Oral  SpO2: 97% 97% 98% 100%  Weight:      Height:        Intake/Output Summary (Last 24 hours) at 03/14/2021 0942 Last data filed at 03/14/2021 0913 Gross per 24 hour  Intake 1744.2 ml  Output 4985 ml  Net -3240.8 ml   Filed Weights   03/06/21 0131 03/09/21 0918 03/10/21 0600  Weight: 48.1 kg 50.3 kg 52.5 kg    Examination:   General: Not in pain or dyspnea, deconditioned  Neurology: Awake and alert, non focal  E ENT: positive pallor, no icterus, oral mucosa moist Cardiovascular: No JVD. S1-S2 present, rhythmic, no gallops, rubs, or murmurs. No lower extremity edema. Pulmonary: positive breath sounds bilaterally, with wheezing, rhonchi or rales. Poor inspiratory effort.  Gastrointestinal. Abdomen soft and non tender to superficial palpation, surigcal wound with dressing in place Skin. No rashes Musculoskeletal: no joint deformities     Data Reviewed: I have personally reviewed following labs and imaging studies  CBC: Recent Labs  Lab 03/09/21 0257 03/10/21 0248 03/11/21 0256 03/13/21 0419 03/14/21 0301  WBC 14.3* 9.1 11.0* 15.0* 13.0*  HGB 8.2* 7.6* 7.8* 8.5* 8.4*  HCT 25.8* 24.5* 23.8* 26.1* 26.0*  MCV 101.2* 102.5* 98.3 97.8 97.7  PLT 198 154 145* 148* 275*   Basic Metabolic Panel: Recent Labs  Lab 03/09/21 0257 03/10/21 0248 03/11/21 0256 03/12/21 0456 03/13/21 0419 03/14/21 0301  NA 140 137 136 139 138 137  K 3.7 3.6 5.1 4.1 4.1 4.1  CL 115* 111 110 109 108 107  CO2 18* 19* 19* 24 26 26   GLUCOSE 152* 203* 144* 136* 125* 128*  BUN 41* 38* 35* 38* 40* 43*  CREATININE 2.54* 2.19* 1.91* 1.63* 1.62* 1.48*  CALCIUM 7.9* 8.0* 8.3* 8.4* 8.3* 8.3*  MG 2.1 2.0 1.9  --   --   --   PHOS 3.8 2.9 3.0  --   --   --     GFR: Estimated Creatinine Clearance: 22.2 mL/min (A) (by C-G formula based on SCr of 1.48 mg/dL (H)). Liver Function Tests: Recent Labs  Lab 03/10/21 0248 03/11/21 0256  AST 17 22  ALT 22 15  ALKPHOS 38 30*  BILITOT 0.8 1.1  PROT 4.9* 5.1*  ALBUMIN 2.9* 3.5   No results for input(s): LIPASE, AMYLASE in the last 168 hours. No results for input(s): AMMONIA in the last 168 hours. Coagulation Profile: No results for input(s): INR, PROTIME in the last 168 hours. Cardiac Enzymes: No results for input(s): CKTOTAL, CKMB, CKMBINDEX, TROPONINI in the last 168 hours. BNP (last 3 results) No results for input(s): PROBNP in the last 8760 hours. HbA1C: No results for input(s): HGBA1C in the last 72 hours. CBG: Recent Labs  Lab 03/13/21 1610 03/13/21 2016 03/13/21 2307 03/14/21 0458 03/14/21 0905  GLUCAP 131* 141* 142* 134* 139*   Lipid Profile:  No results for input(s): CHOL, HDL, LDLCALC, TRIG, CHOLHDL, LDLDIRECT in the last 72 hours. Thyroid Function Tests: No results for input(s): TSH, T4TOTAL, FREET4, T3FREE, THYROIDAB in the last 72 hours. Anemia Panel: No results for input(s): VITAMINB12, FOLATE, FERRITIN, TIBC, IRON, RETICCTPCT in the last 72 hours.    Radiology Studies: I have reviewed all of the imaging during this hospital visit personally     Scheduled Meds:  chlorhexidine  15 mL Mouth Rinse BID   Chlorhexidine Gluconate Cloth  6 each Topical Daily   enoxaparin (LOVENOX) injection  30 mg Subcutaneous Q24H   insulin aspart  0-9 Units Subcutaneous TID WC   mouth rinse  15 mL Mouth Rinse q12n4p   pantoprazole  40 mg Oral BID   sodium chloride flush  10-40 mL Intracatheter Q12H   Continuous Infusions:  albumin human 12.5 g (03/14/21 0909)   TPN ADULT (ION) 50 mL/hr at 03/13/21 1828     LOS: 9 days        Vylette Strubel Gerome Apley, MD

## 2021-03-15 DIAGNOSIS — R7401 Elevation of levels of liver transaminase levels: Secondary | ICD-10-CM | POA: Diagnosis present

## 2021-03-15 DIAGNOSIS — N179 Acute kidney failure, unspecified: Secondary | ICD-10-CM | POA: Diagnosis present

## 2021-03-15 DIAGNOSIS — I10 Essential (primary) hypertension: Secondary | ICD-10-CM

## 2021-03-15 DIAGNOSIS — E440 Moderate protein-calorie malnutrition: Secondary | ICD-10-CM | POA: Diagnosis not present

## 2021-03-15 DIAGNOSIS — E785 Hyperlipidemia, unspecified: Secondary | ICD-10-CM

## 2021-03-15 DIAGNOSIS — K269 Duodenal ulcer, unspecified as acute or chronic, without hemorrhage or perforation: Secondary | ICD-10-CM

## 2021-03-15 DIAGNOSIS — K265 Chronic or unspecified duodenal ulcer with perforation: Secondary | ICD-10-CM | POA: Diagnosis not present

## 2021-03-15 DIAGNOSIS — D62 Acute posthemorrhagic anemia: Secondary | ICD-10-CM

## 2021-03-15 LAB — COMPREHENSIVE METABOLIC PANEL
ALT: 47 U/L — ABNORMAL HIGH (ref 0–44)
AST: 61 U/L — ABNORMAL HIGH (ref 15–41)
Albumin: 2.8 g/dL — ABNORMAL LOW (ref 3.5–5.0)
Alkaline Phosphatase: 55 U/L (ref 38–126)
Anion gap: 6 (ref 5–15)
BUN: 44 mg/dL — ABNORMAL HIGH (ref 8–23)
CO2: 26 mmol/L (ref 22–32)
Calcium: 8.3 mg/dL — ABNORMAL LOW (ref 8.9–10.3)
Chloride: 106 mmol/L (ref 98–111)
Creatinine, Ser: 1.37 mg/dL — ABNORMAL HIGH (ref 0.44–1.00)
GFR, Estimated: 37 mL/min — ABNORMAL LOW (ref >60)
Glucose, Bld: 129 mg/dL — ABNORMAL HIGH (ref 70–99)
Potassium: 4.5 mmol/L (ref 3.5–5.1)
Sodium: 138 mmol/L (ref 135–145)
Total Bilirubin: 0.7 mg/dL (ref 0.3–1.2)
Total Protein: 4.9 g/dL — ABNORMAL LOW (ref 6.5–8.1)

## 2021-03-15 LAB — GLUCOSE, CAPILLARY
Glucose-Capillary: 130 mg/dL — ABNORMAL HIGH (ref 70–99)
Glucose-Capillary: 139 mg/dL — ABNORMAL HIGH (ref 70–99)
Glucose-Capillary: 128 mg/dL — ABNORMAL HIGH (ref 70–99)
Glucose-Capillary: 142 mg/dL — ABNORMAL HIGH (ref 70–99)
Glucose-Capillary: 89 mg/dL (ref 70–99)

## 2021-03-15 LAB — PHOSPHORUS: Phosphorus: 3.6 mg/dL (ref 2.5–4.6)

## 2021-03-15 LAB — TRIGLYCERIDES: Triglycerides: 43 mg/dL (ref <150)

## 2021-03-15 LAB — MAGNESIUM: Magnesium: 2.1 mg/dL (ref 1.7–2.4)

## 2021-03-15 MED ORDER — AMLODIPINE BESYLATE 5 MG PO TABS
2.5000 mg | ORAL_TABLET | Freq: Every day | ORAL | Status: DC
Start: 2021-03-15 — End: 2021-03-16
  Administered 2021-03-15: 12:00:00 2.5 mg via ORAL
  Filled 2021-03-15: qty 1

## 2021-03-15 MED ORDER — TRAVASOL 10 % IV SOLN
INTRAVENOUS | Status: AC
  Filled 2021-03-15: qty 840

## 2021-03-15 MED ORDER — GLUCERNA SHAKE PO LIQD
237.0000 mL | Freq: Three times a day (TID) | ORAL | Status: DC
  Administered 2021-03-18 – 2021-03-30 (×26): 237 mL via ORAL
  Filled 2021-03-15 (×45): qty 237

## 2021-03-15 MED ORDER — BETHANECHOL CHLORIDE 10 MG PO TABS
5.0000 mg | ORAL_TABLET | Freq: Three times a day (TID) | ORAL | Status: DC
  Administered 2021-03-15 – 2021-03-29 (×43): 5 mg via ORAL
  Filled 2021-03-15 (×18): qty 0.5
  Filled 2021-03-15: qty 1
  Filled 2021-03-15 (×26): qty 0.5

## 2021-03-15 NOTE — Progress Notes (Signed)
PROGRESS NOTE/Consult.    Kathleen Jimenez  UYQ:[id number] DOB: 30-Oct-1931 DOA: 03/05/2021 PCP: Ginger Organ., MD    Chief Complaint  Patient presents with   Abdominal Pain    Brief Narrative:  Medical consultation for inpatient management of CKD stage IV, HTN, and dyslipidemia    Patient admitted to the hospital with the working diagnosis of perforated viscus to the surgery team.  87 yo female who presented with 1 day of abdominal pain, work up in the ED showed perforated viscus and she was taken urgently to the operating room.    12/16 exploratory laparotomy    She was found to have a perforated duodenal ulcer, she underwent washout and intra-abdominal drain.  Placed on broad spectrum antibiotic therapy and transferred to the step down unit.    Patient developed agitation and delirium, and required precedex infusion.  She had NG tube post operative that was removed on 12/20.    Transferred to the med surg unit on 12/22.    Her medical problems have remained stable. Patient has been very weak and deconditioned, poor oral intake.      Assessment & Plan:   Principal Problem:   Perforated duodenal ulcer ([place]) Active Problems:   Acute respiratory failure (HCC)   Perforated abdominal viscus   Malnutrition of moderate degree  #1 perforated duodenal ulcer with peritonitis status post exploratory laparotomy and repair -Clinical improvement with abdominal pain, patient however with deconditioning generalized weakness and poor oral intake. -On TPN. -WBC trending down, off antibiotics. -Continue PPI. -On IV albumin x7 days. -Chest x-ray 03/13/2021 with noted bilateral pleural effusions small more predominant on the right side associated with atelectasis. -Continue pulmonary toilet with incentive spirometry, nutritional supplementation. -No signs of pneumonia, will hold off on antibiotics at this time. -Per primary team/general surgery.  2.  Acute postop anemia on chronic  anemia -Follow H&H. -Transfusion threshold hemoglobin < 7.  3.  Acute kidney injury on CKD stage IV -Creatinine currently at 1.37 and improving slowly daily. -Follow.  4.  Transaminitis -Likely secondary to TPN. -Repeat labs in AM.  5.  Hypertension -Antihypertensive medications on hold due to concern for increased risk of hypotension. -Start Norvasc 2.5 mg daily and uptitrate to home dose.  6.  Dyslipidemia -Continue to hold statin.  7.  Moderate protein calorie malnutrition/stage I sacral pressure ulcer, POA -TPN, nutritional supplementation.  Pressure Injury 03/06/21 Sacrum Mid Stage 1 -  Intact skin with non-blanchable redness of a localized area usually over a bony prominence. skin appears reddened, scabbed and does not blanch (Active)  03/06/21 0131  Location: Sacrum  Location Orientation: Mid  Staging: Stage 1 -  Intact skin with non-blanchable redness of a localized area usually over a bony prominence.  Wound Description (Comments): skin appears reddened, scabbed and does not blanch  Present on Admission: Yes     #8.  Acute metabolic encephalopathy with delirium -Resolved.    DVT prophylaxis: Lovenox Code Status: Full Family Communication: Updated son at bedside. Disposition:   Status is: Inpatient  Remains inpatient appropriate because: Severity of illness       Consultants:  PCCM: Dr. Carren Rang 03/06/2021  Procedures:  Chest x-ray 03/13/2021 CT chest abdomen and pelvis 03/05/2021 Exploratory laparotomy and Phillip Heal patch repair of perforated duodenal ulcer per Dr.Toth III, general surgery 03/06/2021  Antimicrobials:  IV Zosyn 03/06/2021>>>> 03/12/2021   Subjective: Patient laying in bed.  Denies any chest pain.  No shortness of breath.  States abdominal pain has improved  significantly since admission.  Son at bedside.  Patient with poor oral intake per son at bedside.  Objective: Vitals:   03/14/21 1430 03/14/21 2011 03/14/21 2012 03/15/21 0449   BP: (!) 168/69 (!) 169/58 (!) 163/57 (!) 166/62  Pulse: 71 67 64 (!) 59  Resp: 15 16  16   Temp: 98.7 F (37.1 C) 99.3 F (37.4 C)  98.8 F (37.1 C)  TempSrc: Oral Oral  Oral  SpO2: 95% 94%  96%  Weight:      Height:        Intake/Output Summary (Last 24 hours) at 03/15/2021 1127 Last data filed at 03/15/2021 1000 Gross per 24 hour  Intake 1464.36 ml  Output 3340 ml  Net -1875.64 ml   Filed Weights   03/06/21 0131 03/09/21 0918 03/10/21 0600  Weight: 48.1 kg 50.3 kg 52.5 kg    Examination:  General exam: Appears calm and comfortable  Respiratory system: Clear to auscultation anterior lung fields.Marland Kitchen Respiratory effort normal. Cardiovascular system: S1 & S2 heard, RRR. No JVD, murmurs, rubs, gallops or clicks. No pedal edema. Gastrointestinal system: Abdomen is nondistended, soft and some diffuse tenderness to palpation improved since admission.  Bandage on mid abdominal region.  JP drain with clear yellow drainage.  Central nervous system: Alert and oriented. No focal neurological deficits. Extremities: Symmetric 5 x 5 power. Skin: No rashes, lesions or ulcers Psychiatry: Judgement and insight appear normal. Mood & affect appropriate.     Data Reviewed: I have personally reviewed following labs and imaging studies  CBC: Recent Labs  Lab 03/09/21 0257 03/10/21 0248 03/11/21 0256 03/13/21 0419 03/14/21 0301  WBC 14.3* 9.1 11.0* 15.0* 13.0*  HGB 8.2* 7.6* 7.8* 8.5* 8.4*  HCT 25.8* 24.5* 23.8* 26.1* 26.0*  MCV 101.2* 102.5* 98.3 97.8 97.7  PLT 198 154 145* 148* 142*    Basic Metabolic Panel: Recent Labs  Lab 03/09/21 0257 03/10/21 0248 03/11/21 0256 03/12/21 0456 03/13/21 0419 03/14/21 0301 03/15/21 0315  NA 140 137 136 139 138 137 138  K 3.7 3.6 5.1 4.1 4.1 4.1 4.5  CL 115* 111 110 109 108 107 106  CO2 18* 19* 19* 24 26 26 26   GLUCOSE 152* 203* 144* 136* 125* 128* 129*  BUN 41* 38* 35* 38* 40* 43* 44*  CREATININE 2.54* 2.19* 1.91* 1.63* 1.62* 1.48*  1.37*  CALCIUM 7.9* 8.0* 8.3* 8.4* 8.3* 8.3* 8.3*  MG 2.1 2.0 1.9  --   --   --  2.1  PHOS 3.8 2.9 3.0  --   --   --  3.6    GFR: Estimated Creatinine Clearance: 23.9 mL/min (A) (by C-G formula based on SCr of 1.37 mg/dL (H)).  Liver Function Tests: Recent Labs  Lab 03/10/21 0248 03/11/21 0256 03/15/21 0315  AST 17 22 61*  ALT 22 15 47*  ALKPHOS 38 30* 55  BILITOT 0.8 1.1 0.7  PROT 4.9* 5.1* 4.9*  ALBUMIN 2.9* 3.5 2.8*    CBG: Recent Labs  Lab 03/14/21 1626 03/14/21 2008 03/14/21 2343 03/15/21 0426 03/15/21 0728  GLUCAP 145* 135* 122* 130* 139*     Recent Results (from the past 240 hour(s))  Resp Panel by RT-PCR (Flu A&B, Covid) Nasopharyngeal Swab     Status: None   Collection Time: 03/05/21  7:50 PM   Specimen: Nasopharyngeal Swab; Nasopharyngeal(NP) swabs in vial transport medium  Result Value Ref Range Status   SARS Coronavirus 2 by RT PCR NEGATIVE NEGATIVE Final    Comment: (NOTE) SARS-CoV-2 target nucleic  acids are NOT DETECTED.  The SARS-CoV-2 RNA is generally detectable in upper respiratory specimens during the acute phase of infection. The lowest concentration of SARS-CoV-2 viral copies this assay can detect is 138 copies/mL. A negative result does not preclude SARS-Cov-2 infection and should not be used as the sole basis for treatment or other patient management decisions. A negative result may occur with  improper specimen collection/handling, submission of specimen other than nasopharyngeal swab, presence of viral mutation(s) within the areas targeted by this assay, and inadequate number of viral copies(<138 copies/mL). A negative result must be combined with clinical observations, patient history, and epidemiological information. The expected result is Negative.  Fact Sheet for Patients:  EntrepreneurPulse.com.au  Fact Sheet for Healthcare Providers:  IncredibleEmployment.be  This test is no t yet approved  or cleared by the [place] FDA and  has been authorized for detection and/or diagnosis of SARS-CoV-2 by FDA under an Emergency Use Authorization (EUA). This EUA will remain  in effect (meaning this test can be used) for the duration of the COVID-19 declaration under Section 564(b)(1) of the Act, 21 U.S.C.section 360bbb-3(b)(1), unless the authorization is terminated  or revoked sooner.       Influenza A by PCR NEGATIVE NEGATIVE Final   Influenza B by PCR NEGATIVE NEGATIVE Final    Comment: (NOTE) The Xpert Xpress SARS-CoV-2/FLU/RSV plus assay is intended as an aid in the diagnosis of influenza from Nasopharyngeal swab specimens and should not be used as a sole basis for treatment. Nasal washings and aspirates are unacceptable for Xpert Xpress SARS-CoV-2/FLU/RSV testing.  Fact Sheet for Patients: EntrepreneurPulse.com.au  Fact Sheet for Healthcare Providers: IncredibleEmployment.be  This test is not yet approved or cleared by the [place] FDA and has been authorized for detection and/or diagnosis of SARS-CoV-2 by FDA under an Emergency Use Authorization (EUA). This EUA will remain in effect (meaning this test can be used) for the duration of the COVID-19 declaration under Section 564(b)(1) of the Act, 21 U.S.C. section 360bbb-3(b)(1), unless the authorization is terminated or revoked.  Performed at [facility], [place] [address], [place], [place] [address]   Culture, blood (routine x 2)     Status: None   Collection Time: 03/06/21  1:49 AM   Specimen: BLOOD  Result Value Ref Range Status   Specimen Description   Final    BLOOD RIGHT ANTECUBITAL Performed at [place] [facility] [address], [place], [place] [address]    Special Requests   Final    BOTTLES DRAWN AEROBIC ONLY Blood Culture adequate volume Performed at [facility] [address] [place], [place] [address]     Culture   Final    NO GROWTH 5 DAYS Performed at [facility], [place] [address] [place], [place] [address]    Report Status 03/11/2021 FINAL  Final  Culture, blood (routine x 2)     Status: None   Collection Time: 03/06/21  1:49 AM   Specimen: BLOOD  Result Value Ref Range Status   Specimen Description   Final    BLOOD BLOOD RIGHT ARM Performed at [place] [address], [place], [place] [address]    Special Requests   Final    BOTTLES DRAWN AEROBIC ONLY Blood Culture adequate volume Performed at [facility] [address], [place], [place] [address]    Culture   Final    NO GROWTH 5 DAYS Performed at [facility],  [address], [place], [place] [address]    Report Status 03/11/2021 FINAL  Final  MRSA Next Gen by PCR, Nasal     Status: None   Collection Time: 03/06/21  1:50 AM   Specimen: Nasal Mucosa; Nasal Swab  Result Value Ref Range Status   MRSA by PCR Next Gen NOT DETECTED NOT DETECTED Final    Comment: (NOTE) The GeneXpert MRSA Assay (FDA approved for NASAL specimens only), is one component of a comprehensive MRSA colonization surveillance program. It is not intended to diagnose MRSA infection nor to guide or monitor treatment for MRSA infections. Test performance is not FDA approved in patients less than 72 years old. Performed at [facility], [place] [address], [place], [place] [address]          Radiology Studies: No results found.      Scheduled Meds:  amLODipine  2.5 mg Oral Daily   bethanechol  5 mg Oral TID   chlorhexidine  15 mL Mouth Rinse BID   Chlorhexidine Gluconate Cloth  6 each Topical Daily   enoxaparin (LOVENOX) injection  30 mg Subcutaneous Q24H   feeding supplement (GLUCERNA SHAKE)  237 mL Oral TID BM   insulin aspart  0-9 Units Subcutaneous TID WC   mouth rinse  15 mL Mouth Rinse q12n4p   pantoprazole  40 mg Oral BID   sodium chloride flush  10-40 mL  Intracatheter Q12H   Continuous Infusions:  albumin human 12.5 g (03/14/21 0909)   TPN ADULT (ION) 70 mL/hr at 03/14/21 1844   TPN ADULT (ION)       LOS: 10 days    Time spent: 35 minutes    Irine Seal, MD [facility]   To contact the attending provider between 7A-7P or the covering provider during after hours 7P-7A, please log into the web site [email or web address] and access using universal [place] password for that web site. If you do not have the password, please call the hospital operator.  03/15/2021, 11:27 AM

## 2021-03-15 NOTE — Progress Notes (Signed)
PHARMACY - TOTAL PARENTERAL NUTRITION CONSULT NOTE   Indication:  Intolerance to enteral feeding  Patient Measurements: Height: 5\' 3"  (160 cm) Weight: 52.5 kg (115 lb 11.9 oz) IBW/kg (Calculated) : 52.4 TPN AdjBW (KG): 48.1 Body mass index is 20.5 kg/m. Usual Weight:   Assessment: 87 yo female with PMH hypertension, carotid stenosis, chronic renal insuffiencey & anemia admitted on 12/16 with abdominal pain.  CT revealed perforated viscus and was taken to the OR for gastric repair of perforated duodenal ulcer with washout and placement of intra-abdominal drain.  Pharmacy is consulted to start TPN on 12/20.  Glucose / Insulin: No hx DM. CBGs controlled, 3 units/24 hr, sSSI tidac Electrolytes: Lytes wnl. Renal: Hx CKD, SCr 1.37 (below baseline 1.9), BUN elevated (44) Hepatic: AST/ALT slightly elevated, Tbili wnl, Alb 2.8. Prealbumin 8.5 on 12/19 Triglycerides: WNL (43 on 12/21) Intake / Output: UOP 1.15 L/24h, Drain output decreasing to 337ml/24hr  MIVF: none GI Imaging: - 12/16 CT abd Moderate free air with mild-to-moderate free fluid in the abdomen. Findings consistent with perforated hollow viscus.  GI Surgeries / Procedures:  - 12/17 exploratory laparotomy and graham patch repair for perforated duodenal ulcer  Central access: PICC placed 12/20 TPN start date: 12/20  Nutritional Goals: Goal TPN rate is 70 mL/hr (provides 84 g of protein and 1612 kcals per day)  RD Assessment: pending Estimated Needs Total Energy Estimated Needs: 1500-1700 kcal Total Protein Estimated Needs: 75-90 grams Total Fluid Estimated Needs: >/= 1.7 L/day  Current Nutrition:  TPN Full liquid diet 12/23 >  Still poor PO intake  Plan:  Continue TPN at 18mL/hr at 1800 Electrolytes in TPN:  Na 50 mEq/L,  K 35 mEq/L Ca  5 mEq/L,  Mg 5 mEq/L,  Phos 15 mmol/L.  Cl:Ac 1:2 Add standard MVI and trace elements to TPN CBG and sensitive SSI tidac Monitor TPN labs on Mon/Thurs  Elenor Quinones,  PharmD, BCPS, BCIDP Clinical Pharmacist 03/15/2021 7:18 AM

## 2021-03-15 NOTE — Progress Notes (Signed)
10 Days Post-Op   Subjective/Chief Complaint: No complaints today Ate some jello and potato soup yesterday Foley - clear yellow urine   Objective: Vital signs in last 24 hours: Temp:  [98.7 F (37.1 C)-99.3 F (37.4 C)] 98.8 F (37.1 C) (12/26 0449) Pulse Rate:  [59-71] 59 (12/26 0449) Resp:  [15-16] 16 (12/26 0449) BP: (163-169)/(57-69) 166/62 (12/26 0449) SpO2:  [94 %-96 %] 96 % (12/26 0449) Last BM Date: 03/14/21  Intake/Output from previous day: 12/25 0701 - 12/26 0700 In: 1688.5 [P.O.:290; I.V.:1348.5; IV Piggyback:50] Out: 9622 [Urine:3400; Drains:870] Intake/Output this shift: Total I/O In: -  Out: 250 [Urine:250]  General: pleasant, WD, frail female who is laying in bed in NAD HEENT: sclera anicteric Heart: regular, rate, and rhythm.   Lungs: Respiratory effort nonlabored Abd: soft, non-tender to palpation, no distention, +BS, JP in RLQ with thin serous fluid, midline with some fibrinous looking exudate but no surrounding cellulitis and no purulent appearing drainage   Lab Results:  Recent Labs    03/13/21 0419 03/14/21 0301  WBC 15.0* 13.0*  HGB 8.5* 8.4*  HCT 26.1* 26.0*  PLT 148* 142*   BMET Recent Labs    03/14/21 0301 03/15/21 0315  NA 137 138  K 4.1 4.5  CL 107 106  CO2 26 26  GLUCOSE 128* 129*  BUN 43* 44*  CREATININE 1.48* 1.37*  CALCIUM 8.3* 8.3*   PT/INR No results for input(s): LABPROT, INR in the last 72 hours. ABG No results for input(s): PHART, HCO3 in the last 72 hours.  Invalid input(s): PCO2, PO2  Studies/Results: No results found.  Anti-infectives: Anti-infectives (From admission, onward)    Start     Dose/Rate Route Frequency Ordered Stop   03/06/21 0400  piperacillin-tazobactam (ZOSYN) IVPB 2.25 g        2.25 g 100 mL/hr over 30 Minutes Intravenous Every 6 hours 03/06/21 0216 03/12/21 1707   03/05/21 2115  piperacillin-tazobactam (ZOSYN) IVPB 3.375 g        3.375 g 100 mL/hr over 30 Minutes Intravenous  Once  03/05/21 2101 03/05/21 2203       Assessment/Plan: Perforated duodenal ulcer S/P ex-lap with graham patch repair 03/05/21 Dr. Marlou Starks - POD 10 - UGI without leak  - full liquids as tolerated - continue to mobilize with PT/OT - monitor JP output - no sign of bilious output - off IV abx - PPI  BID IV - H pylori is negative    FEN: full liquids, TPN VTE: LMWH ID: Zosyn 12/16>>12/23, wbc 15 today, will check UA and CXR, if these are neg, will proceed with non-contrast CT scan   HTN: cont home meds, appreciate TRH assistance HLD CKD stage IV - Cr trending down today, UOP appears increased for last 24 hrs, baseline reportedly between 1.9 and 2.5 Carotid stenosis  Chronic anemia - hgb 8.5, continue to monitor  Moderate protein calorie malnutrition - TPN as listed above; encourage PO intake Physical deconditioning  Urinary retention- 1000 cc with cath yesterday Urine looks ok and CXR ? Atelectasis vs effusion vs pneumonia    Place foley for better decompression - add urecholine today - continue Foley one more day for urinary retention.   LOS: 10 days    Kathleen Jimenez 03/15/2021

## 2021-03-15 NOTE — Progress Notes (Signed)
Nutrition Follow-up  DOCUMENTATION CODES:   Non-severe (moderate) malnutrition in context of chronic illness  INTERVENTION:   -TPN management per Pharmacy  -Will order Glucerna Shake po TID, each supplement provides 220 kcal and 10 grams of protein   NUTRITION DIAGNOSIS:   Moderate Malnutrition related to chronic illness as evidenced by moderate fat depletion, severe muscle depletion, mild muscle depletion.  Ongoing.  GOAL:   Patient will meet greater than or equal to 90% of their needs  Progressing.  MONITOR:   Diet advancement, Labs, Weight trends, Skin, Other (Comment) (TPN regimen)  ASSESSMENT:   87 year-old female with medical history of HTN, anemia, CKD, and L carotid stenosis. She presented to the ED due to abdominal pain which began the day PTA and reported sour taste in her mouth for several weeks. In the ED she had a CT which was consistent with free air and bowel perforation.  12/17: s/p ex lap with graham patch repair d/t perforated duodenal ulcer 12/20: PICC placed, TPN initiated 12/21: bari-CLD 12/23: FLD  Pt now on full liquids, diet designated as CHO modified which will limit calories. Pt not taking in many liquids at this time. At most 120 ml at a time.   TPN to continue at 70 ml/hr -providing 1612 kcals and 84g protein.  Admission weight: 106 lbs. Current weight: 115 lbs.  I/Os: +2.5L since admit Drains: 870 ml UOP: 3650 ml x 24 hrs  Medications reviewed.  Labs reviewed:  CBGs: 122-145 TG: 43  Diet Order:   Diet Order             Diet full liquid Room service appropriate? Yes; Fluid consistency: Thin  Diet effective now                   EDUCATION NEEDS:   Not appropriate for education at this time  Skin:  Skin Assessment: Skin Integrity Issues: Skin Integrity Issues:: Stage I, Incisions Stage I: sacrum Incisions: abdomen (12/17)  Last BM:  12/26 -type 7  Height:   Ht Readings from Last 1 Encounters:  03/06/21 5\' 3"   (1.6 m)    Weight:   Wt Readings from Last 1 Encounters:  03/10/21 52.5 kg    Ideal Body Weight:  52.3 kg  BMI:  Body mass index is 20.5 kg/m.  Estimated Nutritional Needs:   Kcal:  1500-1700 kcal  Protein:  75-90 grams  Fluid:  >/= 1.7 L/day  Clayton Bibles, MS, RD, LDN Inpatient Clinical Dietitian Contact information available via Amion

## 2021-03-15 NOTE — Progress Notes (Signed)
Physical Therapy Treatment Patient Details Name: Kathleen Jimenez MRN: [id number] DOB: 05-Apr-1931 Today's Date: 03/15/2021   History of Present Illness 87 yo female  s/p exploratory laparotomy for perforated duodenal ulcer on 12/16. PMH : IM nail, lumbar fusion, CKD    PT Comments    Patient agreeable to OOB activity today with encouragement but continues to fatigue quickly. Patient able to mobilize with min to mod assist to move to EOB and Min assist to rise and sequence steps to recliner. Pt with flexed posture and poor balance in standing and +2 assist required for safety with pericare. EOS pt repositioned in recliner and encouraged to remain upright and active today and to spend more time OOB. Pt agreeable. Acute PT will continue to progress pt as able throughout her stay, recommendation for SNF follow up remains appropriate.    Recommendations for follow up therapy are one component of a multi-disciplinary discharge planning process, led by the attending physician.  Recommendations may be updated based on patient status, additional functional criteria and insurance authorization.  Follow Up Recommendations  Skilled nursing-short term rehab (<3 hours/day)     Assistance Recommended at Discharge Frequent or constant Supervision/Assistance  Equipment Recommendations  None recommended by PT    Recommendations for Other Services       Precautions / Restrictions Precautions Precautions: Fall Precaution Comments: incontinent of urine and BM, JP drain, abdominal incision Restrictions Weight Bearing Restrictions: No     Mobility  Bed Mobility Overal bed mobility: Needs Assistance Bed Mobility: Rolling;Sidelying to Sit;Sit to Sidelying Rolling: Min assist;Mod assist Sidelying to sit: Mod assist;HOB elevated            Transfers Overall transfer level: Needs assistance Equipment used: Rolling walker (2 wheels) Transfers: Sit to/from Stand;Bed to chair/wheelchair/BSC Sit to  Stand: Min assist;From elevated surface     Step pivot transfers: Min assist;From elevated surface;+2 safety/equipment     General transfer comment: Min assist to rise from elevated EOB, 2+ assist with NT to provide pericare secondary to BM (dried). pt required min assist to manage RW and cues to sequence small steps to move to recliner.    Ambulation/Gait                   Stairs             Wheelchair Mobility    Modified Rankin (Stroke Patients Only)       Balance Overall balance assessment: Needs assistance Sitting-balance support: Feet supported;Bilateral upper extremity supported Sitting balance-Leahy Scale: Fair     Standing balance support: Reliant on assistive device for balance;During functional activity;Bilateral upper extremity supported Standing balance-Leahy Scale: Poor                              Cognition Arousal/Alertness: Awake/alert Behavior During Therapy: Flat affect Overall Cognitive Status: No family/caregiver present to determine baseline cognitive functioning                                 General Comments: pleasant and follows cues, pt reports feeling unwell and fatigued but willing to mobilize with encouragement        Exercises      General Comments        Pertinent Vitals/Pain Pain Assessment: Faces Faces Pain Scale: No hurt Pain Intervention(s): Monitored during session    Home Living  Prior Function            PT Goals (current goals can now be found in the care plan section) Acute Rehab PT Goals PT Goal Formulation: With patient Time For Goal Achievement: 03/22/21 Potential to Achieve Goals: Good Progress towards PT goals: Progressing toward goals    Frequency    Min 3X/week      PT Plan Current plan remains appropriate    Co-evaluation              AM-PAC PT "6 Clicks" Mobility   Outcome Measure  Help needed turning from  your back to your side while in a flat bed without using bedrails?: A Little Help needed moving from lying on your back to sitting on the side of a flat bed without using bedrails?: A Lot Help needed moving to and from a bed to a chair (including a wheelchair)?: A Little Help needed standing up from a chair using your arms (e.g., wheelchair or bedside chair)?: A Little Help needed to walk in hospital room?: A Lot Help needed climbing 3-5 steps with a railing? : Total 6 Click Score: 14    End of Session Equipment Utilized During Treatment: Gait belt Activity Tolerance: Patient tolerated treatment well;Patient limited by fatigue Patient left: in chair;with call bell/phone within reach;with chair alarm set Nurse Communication: Mobility status PT Visit Diagnosis: Other abnormalities of gait and mobility (R26.89);Muscle weakness (generalized) (M62.81);Difficulty in walking, not elsewhere classified (R26.2)     Time: 6283-1517 PT Time Calculation (min) (ACUTE ONLY): 23 min  Charges:  $Therapeutic Activity: 23-37 mins                     Verner Mould, DPT Acute Rehabilitation Services Office [phone] Pager [phone]    Jacques Navy 03/15/2021, 10:53 AM

## 2021-03-16 ENCOUNTER — Inpatient Hospital Stay (HOSPITAL_COMMUNITY): Payer: PPO

## 2021-03-16 DIAGNOSIS — I10 Essential (primary) hypertension: Secondary | ICD-10-CM | POA: Diagnosis not present

## 2021-03-16 DIAGNOSIS — K269 Duodenal ulcer, unspecified as acute or chronic, without hemorrhage or perforation: Secondary | ICD-10-CM | POA: Diagnosis not present

## 2021-03-16 DIAGNOSIS — E440 Moderate protein-calorie malnutrition: Secondary | ICD-10-CM | POA: Diagnosis not present

## 2021-03-16 DIAGNOSIS — K265 Chronic or unspecified duodenal ulcer with perforation: Secondary | ICD-10-CM | POA: Diagnosis not present

## 2021-03-16 LAB — BASIC METABOLIC PANEL
Anion gap: 5 (ref 5–15)
BUN: 51 mg/dL — ABNORMAL HIGH (ref 8–23)
CO2: 26 mmol/L (ref 22–32)
Calcium: 8.2 mg/dL — ABNORMAL LOW (ref 8.9–10.3)
Chloride: 107 mmol/L (ref 98–111)
Creatinine, Ser: 1.36 mg/dL — ABNORMAL HIGH (ref 0.44–1.00)
GFR, Estimated: 38 mL/min — ABNORMAL LOW (ref >60)
Glucose, Bld: 114 mg/dL — ABNORMAL HIGH (ref 70–99)
Potassium: 4.5 mmol/L (ref 3.5–5.1)
Sodium: 138 mmol/L (ref 135–145)

## 2021-03-16 LAB — CBC
HCT: 24.9 % — ABNORMAL LOW (ref 36.0–46.0)
Hemoglobin: 8.2 g/dL — ABNORMAL LOW (ref 12.0–15.0)
MCH: 31.7 pg (ref 26.0–34.0)
MCHC: 32.9 g/dL (ref 30.0–36.0)
MCV: 96.1 fL (ref 80.0–100.0)
Platelets: 154 10*3/uL (ref 150–400)
RBC: 2.59 MIL/uL — ABNORMAL LOW (ref 3.87–5.11)
RDW: 13.1 % (ref 11.5–15.5)
WBC: 11 10*3/uL — ABNORMAL HIGH (ref 4.0–10.5)
nRBC: 0 % (ref 0.0–0.2)

## 2021-03-16 LAB — HEPATIC FUNCTION PANEL
ALT: 145 U/L — ABNORMAL HIGH (ref 0–44)
AST: 152 U/L — ABNORMAL HIGH (ref 15–41)
Albumin: 3.4 g/dL — ABNORMAL LOW (ref 3.5–5.0)
Alkaline Phosphatase: 67 U/L (ref 38–126)
Bilirubin, Direct: 0.1 mg/dL (ref 0.0–0.2)
Indirect Bilirubin: 0.5 mg/dL (ref 0.3–0.9)
Total Bilirubin: 0.6 mg/dL (ref 0.3–1.2)
Total Protein: 5.5 g/dL — ABNORMAL LOW (ref 6.5–8.1)

## 2021-03-16 LAB — IRON AND TIBC
Iron: 28 ug/dL (ref 28–170)
Saturation Ratios: 23 % (ref 10.4–31.8)
TIBC: 122 ug/dL — ABNORMAL LOW (ref 250–450)
UIBC: 94 ug/dL

## 2021-03-16 LAB — GLUCOSE, CAPILLARY
Glucose-Capillary: 114 mg/dL — ABNORMAL HIGH (ref 70–99)
Glucose-Capillary: 123 mg/dL — ABNORMAL HIGH (ref 70–99)
Glucose-Capillary: 132 mg/dL — ABNORMAL HIGH (ref 70–99)
Glucose-Capillary: 132 mg/dL — ABNORMAL HIGH (ref 70–99)
Glucose-Capillary: 132 mg/dL — ABNORMAL HIGH (ref 70–99)
Glucose-Capillary: 158 mg/dL — ABNORMAL HIGH (ref 70–99)
Glucose-Capillary: 229 mg/dL — ABNORMAL HIGH (ref 70–99)

## 2021-03-16 LAB — FERRITIN: Ferritin: 264 ng/mL (ref 11–307)

## 2021-03-16 LAB — RETICULOCYTES
Immature Retic Fract: 19.6 % — ABNORMAL HIGH (ref 2.3–15.9)
RBC.: 2.55 MIL/uL — ABNORMAL LOW (ref 3.87–5.11)
Retic Count, Absolute: 66.6 10*3/uL (ref 19.0–186.0)
Retic Ct Pct: 2.6 % (ref 0.4–3.1)

## 2021-03-16 LAB — VITAMIN B12: Vitamin B-12: 606 pg/mL (ref 180–914)

## 2021-03-16 LAB — FOLATE: Folate: 21 ng/mL (ref >5.9)

## 2021-03-16 MED ORDER — IPRATROPIUM-ALBUTEROL 0.5-2.5 (3) MG/3ML IN SOLN
3.0000 mL | Freq: Three times a day (TID) | RESPIRATORY_TRACT | Status: DC
  Administered 2021-03-16 – 2021-03-17 (×3): 3 mL via RESPIRATORY_TRACT
  Filled 2021-03-16 (×3): qty 3

## 2021-03-16 MED ORDER — FUROSEMIDE 10 MG/ML IJ SOLN
20.0000 mg | Freq: Once | INTRAMUSCULAR | Status: AC
  Administered 2021-03-16: 18:00:00 20 mg via INTRAVENOUS
  Filled 2021-03-16: qty 2

## 2021-03-16 MED ORDER — AMLODIPINE BESYLATE 5 MG PO TABS
5.0000 mg | ORAL_TABLET | Freq: Every day | ORAL | Status: DC
  Administered 2021-03-16 – 2021-03-24 (×9): 5 mg via ORAL
  Filled 2021-03-16 (×9): qty 1

## 2021-03-16 MED ORDER — MOMETASONE FURO-FORMOTEROL FUM 100-5 MCG/ACT IN AERO
2.0000 | INHALATION_SPRAY | Freq: Two times a day (BID) | RESPIRATORY_TRACT | Status: DC
  Administered 2021-03-16 – 2021-03-18 (×4): 2 via RESPIRATORY_TRACT
  Filled 2021-03-16: qty 8.8

## 2021-03-16 MED ORDER — METHYLPREDNISOLONE SODIUM SUCC 125 MG IJ SOLR
60.0000 mg | Freq: Once | INTRAMUSCULAR | Status: AC
  Administered 2021-03-16: 15:00:00 60 mg via INTRAVENOUS
  Filled 2021-03-16: qty 2

## 2021-03-16 MED ORDER — TRAVASOL 10 % IV SOLN
INTRAVENOUS | Status: AC
  Filled 2021-03-16: qty 840

## 2021-03-16 MED ORDER — ALBUTEROL SULFATE (2.5 MG/3ML) 0.083% IN NEBU: 2.5000 mg | INHALATION_SOLUTION | RESPIRATORY_TRACT | Status: DC | PRN

## 2021-03-16 NOTE — Progress Notes (Addendum)
PROGRESS NOTE/Consult.    Kathleen Jimenez  JOA:[id number] DOB: Apr 08, 1931 DOA: 03/05/2021 PCP: Ginger Organ., MD    Chief Complaint  Patient presents with   Abdominal Pain    Brief Narrative:  Medical consultation for inpatient management of CKD stage IV, HTN, and dyslipidemia    Patient admitted to the hospital with the working diagnosis of perforated viscus to the surgery team.  87 yo female who presented with 1 day of abdominal pain, work up in the ED showed perforated viscus and she was taken urgently to the operating room.    12/16 exploratory laparotomy    She was found to have a perforated duodenal ulcer, she underwent washout and intra-abdominal drain.  Placed on broad spectrum antibiotic therapy and transferred to the step down unit.    Patient developed agitation and delirium, and required precedex infusion.  She had NG tube post operative that was removed on 12/20.    Transferred to the med surg unit on 12/22.    Her medical problems have remained stable. Patient has been very weak and deconditioned, poor oral intake.      Assessment & Plan:   Principal Problem:   Duodenal ulcer disease Active Problems:   Acute respiratory failure (HCC)   Perforated abdominal viscus   Malnutrition of moderate degree   Transaminitis   Hyperlipidemia   AKI (acute kidney injury) ([place])  1 perforated duodenal ulcer with peritonitis status post exploratory laparotomy and repair -Clinical improvement with abdominal pain, patient however with deconditioning generalized weakness and poor oral intake. -On TPN. -WBC trending down, off antibiotics. -PPI -On IV albumin x7 days. -Chest x-ray 03/13/2021 with noted bilateral pleural effusions small more predominant on the right side associated with atelectasis. -Continue pulmonary toilet with incentive spirometry, nutritional supplementation. -No signs of pneumonia, will hold off on antibiotics at this time. -Per primary  team/general surgery.  2.  Acute postop anemia on chronic anemia -Follow H&H. -Hemoglobin stable at 8.2. -Transfusion threshold hemoglobin < 7.  3.  Acute kidney injury on CKD stage IV -Creatinine at 1.36.   -Follow.    4.  Transaminitis -Likely secondary to TPN. -LFTs trending up. -Repeat labs in AM.  5.  Hypertension -Antihypertensive medications on hold due to concern for increased risk of hypotension. -BP improving.   -Increase Norvasc to 5 mg daily.  6.  Dyslipidemia -Statin on hold.  7.  Moderate protein calorie malnutrition/stage I sacral pressure ulcer, POA -TPN, nutritional supplementation.  Pressure Injury 03/06/21 Sacrum Mid Stage 1 -  Intact skin with non-blanchable redness of a localized area usually over a bony prominence. skin appears reddened, scabbed and does not blanch (Active)  03/06/21 0131  Location: Sacrum  Location Orientation: Mid  Staging: Stage 1 -  Intact skin with non-blanchable redness of a localized area usually over a bony prominence.  Wound Description (Comments): skin appears reddened, scabbed and does not blanch  Present on Admission: Yes     #8.  Acute metabolic encephalopathy with delirium -Resolved.  9.  Wheezing -Patient noted with expiratory wheezing on examination this morning. -Placed on Dulera, scheduled duo nebs. -We will give a dose of IV Solu-Medrol 60 mg x 1. -Lasix 20mg  IV x 1. -Repeat chest x-ray.    DVT prophylaxis: Lovenox Code Status: Full Family Communication: Updated son at bedside. Disposition:   Status is: Inpatient  Remains inpatient appropriate because: Severity of illness       Consultants:  PCCM: Dr. Carren Rang 03/06/2021  Procedures:  Chest x-ray 03/13/2021 CT chest abdomen and pelvis 03/05/2021 Exploratory laparotomy and Phillip Heal patch repair of perforated duodenal ulcer per Dr.Toth III, general surgery 03/06/2021  Antimicrobials:  IV Zosyn 03/06/2021>>>> 03/12/2021   Subjective: But  easily arousable.  No chest pain.  No shortness of breath.  No nausea or vomiting.  No significant abdominal pain.  Patient stated had bowel movement this morning.   Objective: Vitals:   03/15/21 0449 03/15/21 1352 03/15/21 2107 03/16/21 0458  BP: (!) 166/62 (!) 155/59 (!) 148/53 (!) 168/55  Pulse: (!) 59 70 71 60  Resp: 16 18 18 17   Temp: 98.8 F (37.1 C) 98.5 F (36.9 C) 97.8 F (36.6 C) 98.2 F (36.8 C)  TempSrc: Oral Oral Oral Oral  SpO2: 96% 97% 96% 94%  Weight:      Height:        Intake/Output Summary (Last 24 hours) at 03/16/2021 1309 Last data filed at 03/16/2021 1158 Gross per 24 hour  Intake 1417.65 ml  Output 2450 ml  Net -1032.35 ml    Filed Weights   03/06/21 0131 03/09/21 0918 03/10/21 0600  Weight: 48.1 kg 50.3 kg 52.5 kg    Examination:  General exam: NAD. Respiratory system: Expiratory wheezing anterior lung fields.  No crackles.  Fair air movement.  No use of accessory muscles of respiration.   Cardiovascular system: Regular rate and rhythm no murmurs rubs or gallops.  No JVD.  No lower extremity edema.  Gastrointestinal system: Abdomen is soft, nondistended, some diffuse tenderness to palpation with overall tenderness to palpation improved since admission.  Bandage on mid abdominal region.  JP drain with yellow clear drainage.   Central nervous system: Alert and oriented. No focal neurological deficits. Extremities: Symmetric 5 x 5 power. Skin: No rashes, lesions or ulcers Psychiatry: Judgement and insight appear normal. Mood & affect appropriate.     Data Reviewed: I have personally reviewed following labs and imaging studies  CBC: Recent Labs  Lab 03/10/21 0248 03/11/21 0256 03/13/21 0419 03/14/21 0301 03/16/21 0241  WBC 9.1 11.0* 15.0* 13.0* 11.0*  HGB 7.6* 7.8* 8.5* 8.4* 8.2*  HCT 24.5* 23.8* 26.1* 26.0* 24.9*  MCV 102.5* 98.3 97.8 97.7 96.1  PLT 154 145* 148* 142* 154     Basic Metabolic Panel: Recent Labs  Lab 03/10/21 0248  03/11/21 0256 03/12/21 0456 03/13/21 0419 03/14/21 0301 03/15/21 0315 03/16/21 1155  NA 137 136 139 138 137 138 138  K 3.6 5.1 4.1 4.1 4.1 4.5 4.5  CL 111 110 109 108 107 106 107  CO2 19* 19* 24 26 26 26 26   GLUCOSE 203* 144* 136* 125* 128* 129* 114*  BUN 38* 35* 38* 40* 43* 44* 51*  CREATININE 2.19* 1.91* 1.63* 1.62* 1.48* 1.37* 1.36*  CALCIUM 8.0* 8.3* 8.4* 8.3* 8.3* 8.3* 8.2*  MG 2.0 1.9  --   --   --  2.1  --   PHOS 2.9 3.0  --   --   --  3.6  --      GFR: Estimated Creatinine Clearance: 24.1 mL/min (A) (by C-G formula based on SCr of 1.36 mg/dL (H)).  Liver Function Tests: Recent Labs  Lab 03/10/21 0248 03/11/21 0256 03/15/21 0315 03/16/21 1155  AST 17 22 61* 152*  ALT 22 15 47* 145*  ALKPHOS 38 30* 55 67  BILITOT 0.8 1.1 0.7 0.6  PROT 4.9* 5.1* 4.9* 5.5*  ALBUMIN 2.9* 3.5 2.8* 3.4*     CBG: Recent Labs  Lab 03/15/21 1958 03/16/21  0009 03/16/21 0415 03/16/21 0755 03/16/21 1211  GLUCAP 89 123* 132* 132* 114*      No results found for this or any previous visit (from the past 240 hour(s)).        Radiology Studies: No results found.      Scheduled Meds:  amLODipine  5 mg Oral Daily   bethanechol  5 mg Oral TID   chlorhexidine  15 mL Mouth Rinse BID   Chlorhexidine Gluconate Cloth  6 each Topical Daily   enoxaparin (LOVENOX) injection  30 mg Subcutaneous Q24H   feeding supplement (GLUCERNA SHAKE)  237 mL Oral TID BM   insulin aspart  0-9 Units Subcutaneous TID WC   mouth rinse  15 mL Mouth Rinse q12n4p   pantoprazole  40 mg Oral BID   sodium chloride flush  10-40 mL Intracatheter Q12H   Continuous Infusions:  albumin human 12.5 g (03/16/21 1001)   TPN ADULT (ION) 70 mL/hr at 03/15/21 1943   TPN ADULT (ION)       LOS: 11 days    Time spent: 35 minutes    Irine Seal, MD [facility]   To contact the attending provider between 7A-7P or the covering provider during after hours 7P-7A, please log into the web site  [email or web address] and access using universal [place] password for that web site. If you do not have the password, please call the hospital operator.  03/16/2021, 1:09 PM

## 2021-03-16 NOTE — Progress Notes (Signed)
11 Days Post-Op   Subjective/Chief Complaint: Patient resting comfortably with no complaints Had some PO intake yesterday, one BM No nausea or vomiting Appreciate TRH assistance with medical management  Still with large amount of thin serous drain output (ascites) WBC decreased Good UOP   Objective: Vital signs in last 24 hours: Temp:  [97.8 F (36.6 C)-98.5 F (36.9 C)] 98.2 F (36.8 C) (12/27 0458) Pulse Rate:  [60-71] 60 (12/27 0458) Resp:  [17-18] 17 (12/27 0458) BP: (148-168)/(53-59) 168/55 (12/27 0458) SpO2:  [94 %-97 %] 94 % (12/27 0458) Last BM Date: 03/15/21  Intake/Output from previous day: 12/26 0701 - 12/27 0700 In: 1387.6 [P.O.:60; I.V.:1277.6; IV Piggyback:50] Out: 2150 [Urine:1700; Drains:450] Intake/Output this shift: No intake/output data recorded.  General: pleasant, WD, frail female who is laying in bed in NAD HEENT: sclera anicteric Heart: regular, rate, and rhythm.   Lungs: Respiratory effort nonlabored Abd: soft, non-tender to palpation, no distention, +BS, JP in RUQ with thin serous fluid Midline wound in epigastrium with minimal granulation tissue but no surrounding cellulitis and no purulent appearing drainage   Lab Results:  Recent Labs    03/14/21 0301 03/16/21 0241  WBC 13.0* 11.0*  HGB 8.4* 8.2*  HCT 26.0* 24.9*  PLT 142* 154   BMET Recent Labs    03/14/21 0301 03/15/21 0315  NA 137 138  K 4.1 4.5  CL 107 106  CO2 26 26  GLUCOSE 128* 129*  BUN 43* 44*  CREATININE 1.48* 1.37*  CALCIUM 8.3* 8.3*   PT/INR No results for input(s): LABPROT, INR in the last 72 hours. ABG No results for input(s): PHART, HCO3 in the last 72 hours.  Invalid input(s): PCO2, PO2  Studies/Results: No results found.  Anti-infectives: Anti-infectives (From admission, onward)    Start     Dose/Rate Route Frequency Ordered Stop   03/06/21 0400  piperacillin-tazobactam (ZOSYN) IVPB 2.25 g        2.25 g 100 mL/hr over 30 Minutes Intravenous  Every 6 hours 03/06/21 0216 03/12/21 1707   03/05/21 2115  piperacillin-tazobactam (ZOSYN) IVPB 3.375 g        3.375 g 100 mL/hr over 30 Minutes Intravenous  Once 03/05/21 2101 03/05/21 2203       Assessment/Plan: Perforated duodenal ulcer S/P exploratory laparotomy with Phillip Heal patch repair 03/05/21 Dr. Marlou Starks - POD 11 - UGI without leak  - soft diet as tolerated - continue to mobilize with PT/OT - monitor JP output - no sign of bilious output; large serous output likely due to protein calorie malnutrition - off IV abx - PPI  BID IV - H pylori is negative    FEN: soft diet, TPN VTE: LMWH ID: off abx; WBC trending down   HTN: cont home meds, appreciate TRH assistance HLD CKD stage IV - Cr trending down today, UOP appears increased for last 24 hrs, baseline reportedly between 1.9 and 2.5 Carotid stenosis  Chronic anemia - hgb 8.2, continue to monitor  Moderate protein calorie malnutrition - TPN as listed above; encourage PO intake Physical deconditioning  Urinary retention- Foley replaced 12/25    Place foley for better decompression - add urecholine today - continue Foley until 12/28 for urinary retention.    LOS: 11 days    Kathleen Jimenez 03/16/2021

## 2021-03-16 NOTE — Progress Notes (Signed)
PHARMACY - TOTAL PARENTERAL NUTRITION CONSULT NOTE   Indication:  Intolerance to enteral feeding  Patient Measurements: Height: 5\' 3"  (160 cm) Weight: 52.5 kg (115 lb 11.9 oz) IBW/kg (Calculated) : 52.4 TPN AdjBW (KG): 48.1 Body mass index is 20.5 kg/m. Usual Weight:   Assessment: 87 yo female with PMH hypertension, carotid stenosis, chronic renal insuffiencey & anemia admitted on 12/16 with abdominal pain.  CT revealed perforated viscus and was taken to the OR for gastric repair of perforated duodenal ulcer with washout and placement of intra-abdominal drain.  Pharmacy is consulted to start TPN on 12/20.  Glucose / Insulin: No hx DM. CBGs controlled, 3 units/24 hr, sSSI tidac Electrolytes: Lytes wnl. Renal: Hx CKD, SCr 1.37 (below baseline 1.9), BUN elevated (44) Hepatic: AST/ALT slightly elevated, Tbili wnl, Alb 2.8. Prealbumin 8.5 on 12/19 Triglycerides: WNL (43 on 12/21) Intake / Output: UOP 1.7 L/24h, Drain output 450 ml/24hr  MIVF: none GI Imaging: - 12/16 CT abd Moderate free air with mild-to-moderate free fluid in the abdomen. Findings consistent with perforated hollow viscus.  GI Surgeries / Procedures:  - 12/17 exploratory laparotomy and graham patch repair for perforated duodenal ulcer  Central access: PICC placed 12/20 TPN start date: 12/20  Nutritional Goals: Goal TPN rate is 70 mL/hr (provides 84 g of protein and 1612 kcals per day)  RD Assessment: pending Estimated Needs Total Energy Estimated Needs: 1500-1700 kcal Total Protein Estimated Needs: 75-90 grams Total Fluid Estimated Needs: >/= 1.7 L/day  Current Nutrition:  TPN Full liquid diet 12/23 >  Glucerna shake TID (10 g of protein and 220 kcal each) *Refusing so far Still poor PO intake  Plan:  Continue TPN at 29mL/hr Electrolytes in TPN:  Na 50 mEq/L  K 35 mEq/L Ca  5 mEq/L  Mg 5 mEq/L  Phos 15 mmol/L  Cl:Ac 1:2 Add standard MVI and trace elements to TPN CBG and sensitive SSI tidac Monitor  TPN labs on Mon/Thurs  Elenor Quinones, PharmD, BCPS, BCIDP Clinical Pharmacist 03/16/2021 9:17 AM

## 2021-03-17 ENCOUNTER — Encounter (HOSPITAL_COMMUNITY): Payer: Self-pay

## 2021-03-17 ENCOUNTER — Inpatient Hospital Stay (HOSPITAL_COMMUNITY): Payer: PPO

## 2021-03-17 DIAGNOSIS — K269 Duodenal ulcer, unspecified as acute or chronic, without hemorrhage or perforation: Secondary | ICD-10-CM | POA: Diagnosis not present

## 2021-03-17 LAB — COMPREHENSIVE METABOLIC PANEL
ALT: 149 U/L — ABNORMAL HIGH (ref 0–44)
AST: 114 U/L — ABNORMAL HIGH (ref 15–41)
Albumin: 3.2 g/dL — ABNORMAL LOW (ref 3.5–5.0)
Alkaline Phosphatase: 67 U/L (ref 38–126)
Anion gap: 8 (ref 5–15)
BUN: 58 mg/dL — ABNORMAL HIGH (ref 8–23)
CO2: 24 mmol/L (ref 22–32)
Calcium: 8.4 mg/dL — ABNORMAL LOW (ref 8.9–10.3)
Chloride: 106 mmol/L (ref 98–111)
Creatinine, Ser: 1.44 mg/dL — ABNORMAL HIGH (ref 0.44–1.00)
GFR, Estimated: 35 mL/min — ABNORMAL LOW (ref >60)
Glucose, Bld: 243 mg/dL — ABNORMAL HIGH (ref 70–99)
Potassium: 4.4 mmol/L (ref 3.5–5.1)
Sodium: 138 mmol/L (ref 135–145)
Total Bilirubin: 0.5 mg/dL (ref 0.3–1.2)
Total Protein: 5.5 g/dL — ABNORMAL LOW (ref 6.5–8.1)

## 2021-03-17 LAB — CBC WITH DIFFERENTIAL/PLATELET
Abs Immature Granulocytes: 0.06 10*3/uL (ref 0.00–0.07)
Basophils Absolute: 0 10*3/uL (ref 0.0–0.1)
Basophils Relative: 0 %
Eosinophils Absolute: 0 10*3/uL (ref 0.0–0.5)
Eosinophils Relative: 0 %
HCT: 25.2 % — ABNORMAL LOW (ref 36.0–46.0)
Hemoglobin: 8.2 g/dL — ABNORMAL LOW (ref 12.0–15.0)
Immature Granulocytes: 1 %
Lymphocytes Relative: 5 %
Lymphs Abs: 0.4 10*3/uL — ABNORMAL LOW (ref 0.7–4.0)
MCH: 31.7 pg (ref 26.0–34.0)
MCHC: 32.5 g/dL (ref 30.0–36.0)
MCV: 97.3 fL (ref 80.0–100.0)
Monocytes Absolute: 0.1 10*3/uL (ref 0.1–1.0)
Monocytes Relative: 1 %
Neutro Abs: 9 10*3/uL — ABNORMAL HIGH (ref 1.7–7.7)
Neutrophils Relative %: 93 %
Platelets: 170 10*3/uL (ref 150–400)
RBC: 2.59 MIL/uL — ABNORMAL LOW (ref 3.87–5.11)
RDW: 12.8 % (ref 11.5–15.5)
WBC: 9.6 10*3/uL (ref 4.0–10.5)
nRBC: 0 % (ref 0.0–0.2)

## 2021-03-17 LAB — GLUCOSE, CAPILLARY
Glucose-Capillary: 219 mg/dL — ABNORMAL HIGH (ref 70–99)
Glucose-Capillary: 193 mg/dL — ABNORMAL HIGH (ref 70–99)
Glucose-Capillary: 177 mg/dL — ABNORMAL HIGH (ref 70–99)
Glucose-Capillary: 164 mg/dL — ABNORMAL HIGH (ref 70–99)

## 2021-03-17 LAB — MAGNESIUM: Magnesium: 2.2 mg/dL (ref 1.7–2.4)

## 2021-03-17 LAB — HEMOGLOBIN A1C
Hgb A1c MFr Bld: 5.8 % — ABNORMAL HIGH (ref 4.8–5.6)
Mean Plasma Glucose: 119.76 mg/dL

## 2021-03-17 MED ORDER — ACETAMINOPHEN 10 MG/ML IV SOLN
1000.0000 mg | Freq: Four times a day (QID) | INTRAVENOUS | Status: AC
  Administered 2021-03-17 – 2021-03-18 (×4): 1000 mg via INTRAVENOUS
  Filled 2021-03-17 (×4): qty 100

## 2021-03-17 MED ORDER — IOHEXOL 9 MG/ML PO SOLN
ORAL | Status: AC
  Administered 2021-03-17: 11:00:00 500 mL
  Filled 2021-03-17: qty 1000

## 2021-03-17 MED ORDER — IPRATROPIUM-ALBUTEROL 0.5-2.5 (3) MG/3ML IN SOLN
3.0000 mL | Freq: Two times a day (BID) | RESPIRATORY_TRACT | Status: DC
  Administered 2021-03-17 – 2021-03-18 (×2): 3 mL via RESPIRATORY_TRACT
  Filled 2021-03-17 (×2): qty 3

## 2021-03-17 MED ORDER — HYDRALAZINE HCL 20 MG/ML IJ SOLN: 10.0000 mg | Freq: Four times a day (QID) | INTRAMUSCULAR | Status: DC | PRN

## 2021-03-17 MED ORDER — IOHEXOL 9 MG/ML PO SOLN
500.0000 mL | ORAL | Status: AC
  Administered 2021-03-17: 11:00:00 500 mL via ORAL

## 2021-03-17 MED ORDER — INSULIN ASPART 100 UNIT/ML IJ SOLN
0.0000 [IU] | Freq: Four times a day (QID) | INTRAMUSCULAR | Status: DC
  Administered 2021-03-17 (×2): 2 [IU] via SUBCUTANEOUS
  Administered 2021-03-18 (×3): 1 [IU] via SUBCUTANEOUS
  Administered 2021-03-19: 18:00:00 2 [IU] via SUBCUTANEOUS
  Administered 2021-03-19 – 2021-03-20 (×4): 1 [IU] via SUBCUTANEOUS
  Administered 2021-03-20: 2 [IU] via SUBCUTANEOUS
  Administered 2021-03-20 – 2021-03-27 (×11): 1 [IU] via SUBCUTANEOUS

## 2021-03-17 MED ORDER — TRAMADOL HCL 50 MG PO TABS
50.0000 mg | ORAL_TABLET | Freq: Two times a day (BID) | ORAL | Status: DC | PRN
Start: 2021-03-17 — End: 2021-03-18

## 2021-03-17 MED ORDER — TRAVASOL 10 % IV SOLN
INTRAVENOUS | Status: AC
  Filled 2021-03-17: qty 763.2

## 2021-03-17 NOTE — Progress Notes (Signed)
PT Cancellation Note  Patient Details Name: Kathleen Jimenez MRN: [id number] DOB: 01-Oct-1931   Cancelled Treatment:    Reason Eval/Treat Not Completed: Patient at procedure or test/unavailable CT   Kati L Payson 03/17/2021, 12:44 PM Jannette Spanner PT, DPT Acute Rehabilitation Services Pager: [phone] Office: [phone]

## 2021-03-17 NOTE — Care Management Important Message (Signed)
Important Message  Patient Details IM Letter placed in Patients room. Name: Kathleen Jimenez MRN: [id number] Date of Birth: November 11, 1931   Medicare Important Message Given:  Yes     Kerin Salen 03/17/2021, 12:12 PM

## 2021-03-17 NOTE — Progress Notes (Signed)
PROGRESS NOTE/Consult.    Kathleen Jimenez  AOZ:[id number] DOB: 1931-11-09 DOA: 03/05/2021 PCP: Ginger Organ., MD    Chief Complaint  Patient presents with   Abdominal Pain    Brief Narrative:  Medical consultation for inpatient management of CKD stage IV, HTN, and dyslipidemia    Patient admitted to the hospital with the working diagnosis of perforated viscus to the surgery team.  87 yo female who presented with 1 day of abdominal pain, work up in the ED showed perforated viscus and she was taken urgently to the operating room.    12/16 exploratory laparotomy    She was found to have a perforated duodenal ulcer, she underwent washout and intra-abdominal drain.  Placed on broad spectrum antibiotic therapy and transferred to the step down unit.    Patient developed agitation and delirium, and required precedex infusion.  She had NG tube post operative that was removed on 12/20.    Transferred to the med surg unit on 12/22.    Her medical problems have remained stable. Patient has been very weak and deconditioned, poor oral intake.      Assessment & Plan:   Principal Problem:   Duodenal ulcer disease Active Problems:   Acute respiratory failure ([place])   Perforated abdominal viscus   Malnutrition of moderate degree   Transaminitis   Hyperlipidemia   AKI (acute kidney injury) ([place])  1 perforated duodenal ulcer with peritonitis status post exploratory laparotomy and repair -Per primary team/general surgery.  2.  Acute postop anemia on chronic anemia -Follow H&H. -Hemoglobin stable at 8.2. -Transfusion threshold hemoglobin < 7.  3.  Acute kidney injury on CKD stage IV -Creatinine at 1.36.   -Follow.    4.  Transaminitis -Likely secondary to TPN. -LFTs trending up. -Repeat labs in AM.  5.  Hypertension -Antihypertensive medications on hold due to concern for increased risk of hypotension. -BP improving.   -Increase Norvasc to 5 mg daily.  Currently holding  diuresis although serum creatinine better from baseline.  Elevated blood pressure could be secondary to agitation.  6.  Dyslipidemia -Statin on hold.  7.  Moderate protein calorie malnutrition/stage I sacral pressure ulcer, POA -TPN, nutritional supplementation.  Pressure Injury 03/06/21 Sacrum Mid Stage 1 -  Intact skin with non-blanchable redness of a localized area usually over a bony prominence. skin appears reddened, scabbed and does not blanch (Active)  03/06/21 0131  Location: Sacrum  Location Orientation: Mid  Staging: Stage 1 -  Intact skin with non-blanchable redness of a localized area usually over a bony prominence.  Wound Description (Comments): skin appears reddened, scabbed and does not blanch  Present on Admission: Yes     #8.  Acute metabolic encephalopathy with delirium -Resolved.  9.  Wheezing -Placed on Dulera, scheduled duo nebs. Wheezing currently resolved.  Monitor.   DVT prophylaxis: Lovenox Code Status: Full Family Communication: Updated son at bedside. Disposition:   Status is: Inpatient  Remains inpatient appropriate because: Severity of illness       Consultants:  PCCM: Dr. Carren Rang 03/06/2021  Procedures:  Chest x-ray 03/13/2021 CT chest abdomen and pelvis 03/05/2021 Exploratory laparotomy and Phillip Heal patch repair of perforated duodenal ulcer per Dr.Toth III, general surgery 03/06/2021  Antimicrobials:  IV Zosyn 03/06/2021>>>> 03/12/2021   Subjective: Awake.  Continuously repeating oh lord help me.  No nausea no vomiting.  Denies any pain anywhere as well.  Refused to drink CT contrast.  Objective: Vitals:   03/17/21 0919 03/17/21 1326 03/17/21 2011  03/17/21 2036  BP:  (!) 154/57  (!) 131/54  Pulse:  62  70  Resp:  18  18  Temp:  99.3 F (37.4 C)  98.1 F (36.7 C)  TempSrc:  Axillary  Oral  SpO2:  96% 92% 94%  Weight: 48.7 kg     Height:        Intake/Output Summary (Last 24 hours) at 03/17/2021 2059 Last data filed at  03/17/2021 1900 Gross per 24 hour  Intake 1808.62 ml  Output 2735 ml  Net -926.38 ml    Filed Weights   03/09/21 0918 03/10/21 0600 03/17/21 0919  Weight: 50.3 kg 52.5 kg 48.7 kg    Examination:  General: Appear in mild distress, no Rash; Oral Mucosa Clear, moist. no Abnormal Neck Mass Or lumps, Conjunctiva normal  Cardiovascular: S1 and S2 Present, no Murmur, Respiratory: good respiratory effort, Bilateral Air entry present and CTA, no Crackles, no wheezes Abdomen: Bowel Sound present, Soft and no tenderness Extremities: no Pedal edema Neurology: alert and oriented to self only. affect appropriate. no new focal deficit Gait not checked due to patient safety concerns     Data Reviewed: I have personally reviewed following labs and imaging studies  CBC: Recent Labs  Lab 03/11/21 0256 03/13/21 0419 03/14/21 0301 03/16/21 0241 03/17/21 0317  WBC 11.0* 15.0* 13.0* 11.0* 9.6  NEUTROABS  --   --   --   --  9.0*  HGB 7.8* 8.5* 8.4* 8.2* 8.2*  HCT 23.8* 26.1* 26.0* 24.9* 25.2*  MCV 98.3 97.8 97.7 96.1 97.3  PLT 145* 148* 142* 154 170     Basic Metabolic Panel: Recent Labs  Lab 03/11/21 0256 03/12/21 0456 03/13/21 0419 03/14/21 0301 03/15/21 0315 03/16/21 1155 03/17/21 0317  NA 136   < > 138 137 138 138 138  K 5.1   < > 4.1 4.1 4.5 4.5 4.4  CL 110   < > 108 107 106 107 106  CO2 19*   < > 26 26 26 26 24   GLUCOSE 144*   < > 125* 128* 129* 114* 243*  BUN 35*   < > 40* 43* 44* 51* 58*  CREATININE 1.91*   < > 1.62* 1.48* 1.37* 1.36* 1.44*  CALCIUM 8.3*   < > 8.3* 8.3* 8.3* 8.2* 8.4*  MG 1.9  --   --   --  2.1  --  2.2  PHOS 3.0  --   --   --  3.6  --   --    < > = values in this interval not displayed.     GFR: Estimated Creatinine Clearance: 21.2 mL/min (A) (by C-G formula based on SCr of 1.44 mg/dL (H)).  Liver Function Tests: Recent Labs  Lab 03/11/21 0256 03/15/21 0315 03/16/21 1155 03/17/21 0317  AST 22 61* 152* 114*  ALT 15 47* 145* 149*   ALKPHOS 30* 55 67 67  BILITOT 1.1 0.7 0.6 0.5  PROT 5.1* 4.9* 5.5* 5.5*  ALBUMIN 3.5 2.8* 3.4* 3.2*     CBG: Recent Labs  Lab 03/16/21 2350 03/17/21 0349 03/17/21 0750 03/17/21 1114 03/17/21 1714  GLUCAP 229* 219* 193* 177* 164*      No results found for this or any previous visit (from the past 240 hour(s)).        Radiology Studies: CT ABDOMEN PELVIS WO CONTRAST  Result Date: 03/17/2021 CLINICAL DATA:  Postop day 12 from duodenal perforation. EXAM: CT ABDOMEN AND PELVIS WITHOUT CONTRAST TECHNIQUE: Multidetector CT imaging of  the abdomen and pelvis was performed following the standard protocol without IV contrast. COMPARISON:  CT scan 03/05/2021 FINDINGS: Lower chest: Bilateral pleural effusions with overlying atelectasis. Advanced atherosclerotic calcifications involving the aorta and coronary arteries. Hepatobiliary: Stable left hepatic lobe cyst. The gallbladder is distended and contains a 2 cm gallstone. No pericholecystic fluid. Pancreas: No mass, inflammation ductal dilatation. Spleen: Normal size. No focal lesions. Calcified granulomas are noted. Adrenals/Urinary Tract: Adrenal glands are unremarkable. Bilateral renal cysts are stable. No hydronephrosis. Bladder contains a Foley catheter. Stomach/Bowel: The stomach is largely decompressed. No ulcer or mass is identified. Is situated behind the transverse colon. Duodenum, small bowel and colon are grossly normal. Vascular/Lymphatic: Stable advanced vascular disease. Reproductive: No significant findings. Other: Abdominal drainage catheter in place without complicating features. No significant free abdominal or free pelvic fluid collections and no free air. Open anterior abdominal wound is noted. Musculoskeletal: Stable lumbar fusion hardware. IMPRESSION: 1. Abdominal drainage catheter in place without complicating features. No significant free abdominal or free pelvic fluid collections and no free air. 2. Distended gallbladder  with a 2 cm gallstone. 3. Bilateral pleural effusions with overlying atelectasis. 4. Stable advanced vascular disease. Aortic Atherosclerosis (ICD10-I70.0). Electronically Signed   By: Marijo Sanes M.D.   On: 03/17/2021 15:20   DG CHEST PORT 1 VIEW  Result Date: 03/16/2021 CLINICAL DATA:  Wheezing, shortness of breath EXAM: PORTABLE CHEST 1 VIEW COMPARISON:  03/13/2021 FINDINGS: Transverse diameter of heart is increased. There are no signs of alveolar pulmonary edema or new focal infiltrates. There is improvement in aeration of both lower lung fields. There is blunting of both lateral CP angles. There is no pneumothorax. Tip of PICC line is seen in the region of superior vena cava. IMPRESSION: Cardiomegaly. There is interval improvement in aeration of both lower lung fields suggesting decrease in bilateral pleural effusions. There are no new focal pulmonary infiltrates. Small bilateral pleural effusions. Electronically Signed   By: Elmer Picker M.D.   On: 03/16/2021 14:16        Scheduled Meds:  amLODipine  5 mg Oral Daily   bethanechol  5 mg Oral TID   chlorhexidine  15 mL Mouth Rinse BID   Chlorhexidine Gluconate Cloth  6 each Topical Daily   enoxaparin (LOVENOX) injection  30 mg Subcutaneous Q24H   feeding supplement (GLUCERNA SHAKE)  237 mL Oral TID BM   insulin aspart  0-9 Units Subcutaneous Q6H   ipratropium-albuterol  3 mL Nebulization BID   mouth rinse  15 mL Mouth Rinse q12n4p   mometasone-formoterol  2 puff Inhalation BID   pantoprazole  40 mg Oral BID   sodium chloride flush  10-40 mL Intracatheter Q12H   Continuous Infusions:  acetaminophen 1,000 mg (03/17/21 1509)   albumin human 12.5 g (03/17/21 1107)   TPN ADULT (ION) 60 mL/hr at 03/17/21 1726     LOS: 12 days    Time spent: 35 minutes    Berle Mull, MD [facility]   To contact the attending provider between 7A-7P or the covering provider during after hours 7P-7A, please log into the web site  [email or web address] and access using universal [place] password for that web site. If you do not have the password, please call the hospital operator.  03/17/2021, 8:59 PM

## 2021-03-17 NOTE — Progress Notes (Signed)
OT Cancellation Note  Patient Details Name: Kathleen Jimenez MRN: [id number] DOB: Aug 26, 1931   Cancelled Treatment:    Reason Eval/Treat Not Completed: Patient at procedure or test/ unavailable Patient is off floor at CT at this time. OT to continue to follow and check back as schedule will allow.  Jackelyn Poling OTR/L, [facility] Office# [phone] Pager# [phone]   03/17/2021, 1:25 PM

## 2021-03-17 NOTE — Progress Notes (Signed)
12 Days Post-Op   Subjective/Chief Complaint: Patient sleeping comfortably No new complaints Drain output - 440 cc  IV steroids/ diuresis for respiratory issues - large UOP   Objective: Vital signs in last 24 hours: Temp:  [97.7 F (36.5 C)-98.7 F (37.1 C)] 98.7 F (37.1 C) (12/28 0538) Pulse Rate:  [60-77] 61 (12/28 0549) Resp:  [14-16] 14 (12/28 0538) BP: (144-164)/(61-69) 150/65 (12/28 0549) SpO2:  [93 %-98 %] 94 % (12/27 2151) Last BM Date: 03/15/21  Intake/Output from previous day: 12/27 0701 - 12/28 0700 In: 1839.8 [P.O.:120; I.V.:1669.8; IV Piggyback:50] Out: 3065 [Urine:2625; Drains:440] Intake/Output this shift: No intake/output data recorded.  General: pleasant, WD, frail female who is laying in bed in NAD HEENT: sclera anicteric Heart: regular, rate, and rhythm.   Lungs: Respiratory effort nonlabored Abd: soft, non-tender to palpation, no distention, +BS, JP in RUQ with thin serous fluid Midline wound in epigastrium with some granulation tissue; some thin drainage  Lab Results:  Recent Labs    03/16/21 0241 03/17/21 0317  WBC 11.0* 9.6  HGB 8.2* 8.2*  HCT 24.9* 25.2*  PLT 154 170   BMET Recent Labs    03/16/21 1155 03/17/21 0317  NA 138 138  K 4.5 4.4  CL 107 106  CO2 26 24  GLUCOSE 114* 243*  BUN 51* 58*  CREATININE 1.36* 1.44*  CALCIUM 8.2* 8.4*   PT/INR No results for input(s): LABPROT, INR in the last 72 hours. ABG No results for input(s): PHART, HCO3 in the last 72 hours.  Invalid input(s): PCO2, PO2  Studies/Results: DG CHEST PORT 1 VIEW  Result Date: 03/16/2021 CLINICAL DATA:  Wheezing, shortness of breath EXAM: PORTABLE CHEST 1 VIEW COMPARISON:  03/13/2021 FINDINGS: Transverse diameter of heart is increased. There are no signs of alveolar pulmonary edema or new focal infiltrates. There is improvement in aeration of both lower lung fields. There is blunting of both lateral CP angles. There is no pneumothorax. Tip of PICC line is  seen in the region of superior vena cava. IMPRESSION: Cardiomegaly. There is interval improvement in aeration of both lower lung fields suggesting decrease in bilateral pleural effusions. There are no new focal pulmonary infiltrates. Small bilateral pleural effusions. Electronically Signed   By: Elmer Picker M.D.   On: 03/16/2021 14:16    Anti-infectives: Anti-infectives (From admission, onward)    Start     Dose/Rate Route Frequency Ordered Stop   03/06/21 0400  piperacillin-tazobactam (ZOSYN) IVPB 2.25 g        2.25 g 100 mL/hr over 30 Minutes Intravenous Every 6 hours 03/06/21 0216 03/12/21 1707   03/05/21 2115  piperacillin-tazobactam (ZOSYN) IVPB 3.375 g        3.375 g 100 mL/hr over 30 Minutes Intravenous  Once 03/05/21 2101 03/05/21 2203       Assessment/Plan: Perforated duodenal ulcer S/P exploratory laparotomy with Phillip Heal patch repair 03/05/21 Dr. Marlou Starks - POD 12 - UGI without leak  - soft diet as tolerated - continue to mobilize with PT/OT - monitor JP output - no sign of bilious output; large serous output likely due to protein calorie malnutrition - off IV abx - PPI  BID IV - H pylori is negative    FEN: soft diet, TPN VTE: LMWH ID: off abx; WBC trending down   HTN: cont home meds, appreciate TRH assistance HLD CKD stage IV - Cr trending down today, baseline reportedly between 1.9 and 2.5 Carotid stenosis  Chronic anemia - hgb 8.2, continue to monitor  Moderate  protein calorie malnutrition - TPN as listed above; encourage PO intake Physical deconditioning  Urinary retention- Foley replaced 12/25    Place foley for better decompression - add urecholine - continue Foley until 12/29 for ongoing diuresis  CT scan abd/ pelvis without IV contrast today to evaluate abdomen/ gastric anatomy for possible feeding tube placement since she is unable to take adequate PO intake to support her nutrition.    LOS: 12 days    Maia Petties 03/17/2021

## 2021-03-17 NOTE — Progress Notes (Signed)
PHARMACY - TOTAL PARENTERAL NUTRITION CONSULT NOTE   Indication:  Intolerance to enteral feeding  Patient Measurements: Height: 5\' 3"  (160 cm) Weight: 52.5 kg (115 lb 11.9 oz) IBW/kg (Calculated) : 52.4 TPN AdjBW (KG): 48.1 Body mass index is 20.5 kg/m.  Assessment: 87 yo female with PMH hypertension, carotid stenosis, chronic renal insuffiencey & anemia admitted on 12/16 with abdominal pain.  CT revealed perforated viscus and was taken to the OR for gastric repair of perforated duodenal ulcer with washout and placement of intra-abdominal drain.  Pharmacy is consulted to start TPN on 12/20.  Glucose / Insulin: No hx DM. CBGs controlled on SSI. Given IV steroid on 12/27 and not CBGs in mid 200s.  Electrolytes: Lytes wnl. Renal: Hx CKD, SCr 1.44 (below baseline 1.9), BUN elevated and up to 58 Hepatic: AST/ALT elevated. Hopefully stabilizing. Tbili wnl, Alb 3.2. Prealbumin 8.5 on 12/19. This may be multifactorial but will try to avoid overfeeding  Triglycerides: WNL (43 on 12/21) Intake / Output: UOP 2.6 L/24h, Drain output 440 ml/24hr  MIVF: none GI Imaging: - 12/16 CT abd Moderate free air with mild-to-moderate free fluid in the abdomen. Findings consistent with perforated hollow viscus.  GI Surgeries / Procedures:  - 12/17 exploratory laparotomy and graham patch repair for perforated duodenal ulcer  Central access: PICC placed 12/20 TPN start date: 12/20  Nutritional Goals: Goal TPN rate is 60 mL/hr (provides 76 g of protein and 1498 kcals per day)  RD Assessment: pending Estimated Needs Total Energy Estimated Needs: 1500-1700 kcal Total Protein Estimated Needs: 75-90 grams Total Fluid Estimated Needs: >/= 1.7 L/day  Current Nutrition:  TPN Dypshagia 3 12/27 > (still poor PO intake) Glucerna shake TID (10 g of protein and 220 kcal each) *Refusing so far  Plan:  Decrease TPN to 28mL/hr tonight to slightly reduce protein, dextrose, and lipid components which may help with  transaminitis. Still meeting bottom of range for estimated needs with just parenteral nutrition. Electrolytes in TPN:  Na 50 mEq/L  K 35 mEq/L Ca  5 mEq/L  Mg 5 mEq/L  Phos 15 mmol/L  Cl:Ac 1:2 Add standard MVI and trace elements to TPN Change to sensitive SSI Q6h Monitor TPN labs on Mon/Thurs  Maximizing enteral nutrition may also help transaminitis. Recommend continued encouragement of enteral nutrition.  Elenor Quinones, PharmD, BCPS, BCIDP Clinical Pharmacist 03/17/2021 7:25 AM

## 2021-03-18 DIAGNOSIS — K269 Duodenal ulcer, unspecified as acute or chronic, without hemorrhage or perforation: Secondary | ICD-10-CM | POA: Diagnosis not present

## 2021-03-18 LAB — COMPREHENSIVE METABOLIC PANEL
ALT: 264 U/L — ABNORMAL HIGH (ref 0–44)
AST: 231 U/L — ABNORMAL HIGH (ref 15–41)
Albumin: 3 g/dL — ABNORMAL LOW (ref 3.5–5.0)
Alkaline Phosphatase: 71 U/L (ref 38–126)
Anion gap: 6 (ref 5–15)
BUN: 73 mg/dL — ABNORMAL HIGH (ref 8–23)
CO2: 26 mmol/L (ref 22–32)
Calcium: 8.2 mg/dL — ABNORMAL LOW (ref 8.9–10.3)
Chloride: 106 mmol/L (ref 98–111)
Creatinine, Ser: 1.53 mg/dL — ABNORMAL HIGH (ref 0.44–1.00)
GFR, Estimated: 33 mL/min — ABNORMAL LOW (ref >60)
Glucose, Bld: 130 mg/dL — ABNORMAL HIGH (ref 70–99)
Potassium: 4.4 mmol/L (ref 3.5–5.1)
Sodium: 138 mmol/L (ref 135–145)
Total Bilirubin: 0.6 mg/dL (ref 0.3–1.2)
Total Protein: 4.9 g/dL — ABNORMAL LOW (ref 6.5–8.1)

## 2021-03-18 LAB — GLUCOSE, CAPILLARY
Glucose-Capillary: 115 mg/dL — ABNORMAL HIGH (ref 70–99)
Glucose-Capillary: 121 mg/dL — ABNORMAL HIGH (ref 70–99)
Glucose-Capillary: 133 mg/dL — ABNORMAL HIGH (ref 70–99)
Glucose-Capillary: 140 mg/dL — ABNORMAL HIGH (ref 70–99)
Glucose-Capillary: 146 mg/dL — ABNORMAL HIGH (ref 70–99)

## 2021-03-18 LAB — AMYLASE, PLEURAL OR PERITONEAL FLUID: Amylase, Fluid: 50 U/L

## 2021-03-18 LAB — PHOSPHORUS: Phosphorus: 4.6 mg/dL (ref 2.5–4.6)

## 2021-03-18 LAB — AMYLASE: Amylase: 113 U/L — ABNORMAL HIGH (ref 28–100)

## 2021-03-18 LAB — MAGNESIUM: Magnesium: 2.4 mg/dL (ref 1.7–2.4)

## 2021-03-18 MED ORDER — ACETAMINOPHEN 500 MG PO TABS
1000.0000 mg | ORAL_TABLET | Freq: Three times a day (TID) | ORAL | Status: DC
  Administered 2021-03-18 – 2021-03-30 (×35): 1000 mg via ORAL
  Filled 2021-03-18 (×36): qty 2

## 2021-03-18 MED ORDER — TRAVASOL 10 % IV SOLN
INTRAVENOUS | Status: AC
  Filled 2021-03-18: qty 763.2

## 2021-03-18 NOTE — Progress Notes (Signed)
PHARMACY - TOTAL PARENTERAL NUTRITION CONSULT NOTE   Indication:  Intolerance to enteral feeding  Patient Measurements: Height: 5\' 3"  (160 cm) Weight: 50.5 kg (111 lb 5.3 oz) IBW/kg (Calculated) : 52.4 TPN AdjBW (KG): 48.1 Body mass index is 19.72 kg/m.  Assessment: 87 yo female with PMH hypertension, carotid stenosis, chronic renal insuffiencey & anemia admitted on 12/16 with abdominal pain.  CT revealed perforated viscus and was taken to the OR for gastric repair of perforated duodenal ulcer with washout and placement of intra-abdominal drain.  Pharmacy is consulted to start TPN on 12/20.  Glucose / Insulin: No hx DM. - on sensitive SSI q6h (used 6 units in 24 hrs) - cbgs (goal <150): 130-193 Electrolytes: Phos at upper end of range with 4.6, Mag up 2.4 - CorrCa 9; other lytes wnl  Renal: Hx CKD, SCr up 1.53 (below baseline 1.9), BUN elevated and up to 78 Hepatic: AST/ALT elevated 231/264; Tbili wnl, Alb 3. Prealbumin 8.5 on 12/19.  Triglycerides: WNL (43 on 12/26) Intake / Output: - 477 ml - UOP 2L , Drain output 405 ml MIVF: none GI Imaging: - 12/16 CT abd Moderate free air with mild-to-moderate free fluid in the abdomen. Findings consistent with perforated hollow viscus.  - 12/28 abd CT: No significant free abdominal or free pelvic fluid collections and no free air. Distended gallbladder with a 2 cm gallstone. Bilateral pleural effusions with overlying atelectasis GI Surgeries / Procedures:  - 12/17 exploratory laparotomy and graham patch repair for perforated duodenal ulcer  Central access: PICC placed 12/20 TPN start date: 12/20  Nutritional Goals: Goal TPN rate is 60 mL/hr (provides 76 g of protein and 1498 kcals per day)  RD Assessment: pending Estimated Needs Total Energy Estimated Needs: 1500-1700 kcal Total Protein Estimated Needs: 75-90 grams Total Fluid Estimated Needs: >/= 1.7 L/day  Current Nutrition:  TPN Dypshagia 3 12/27 > (still poor PO  intake) Glucerna shake TID (10 g of protein and 220 kcal each) *Refusing so far  Plan:   At 1800:  - continue TPN at reduced rate of 29mL/hr to slightly reduce protein, dextrose, and lipid components which may help with transaminitis. Still meeting bottom of range for estimated needs with just parenteral nutrition. - Electrolytes in TPN:  Na 50 mEq/L  K 35 mEq/L Ca 5 mEq/L  Reduce Mg to 2.5 mEq/L  Reduce Phos to 5 mmol/L  Cl:Ac 1:2 - Add standard MVI and trace elements to TPN - continue sensitive SSI Q6h - Standard TPN labs on Mon/Thurs - CMET, phos and mag on 12/30  Maximizing enteral nutrition may also help transaminitis. Recommend continued encouragement of enteral nutrition.  Dia Sitter, PharmD, BCPS 03/18/2021 8:44 AM

## 2021-03-18 NOTE — Assessment & Plan Note (Addendum)
From poor p.o. intake postoperatively. Dietitian following.  Continue supplements.  Patient requires feeding assistance. TPN currently stopped as of 1/3. If no improvement in appetite, patient will benefit from being on Marinol. Body mass index is 20.03 kg/m.  Nutrition Problem: Moderate Malnutrition Etiology: chronic illness Nutrition Interventions: Interventions: TPN

## 2021-03-18 NOTE — Progress Notes (Signed)
13 Days Post-Op   Subjective/Chief Complaint: No complaints. sleepy   Objective: Vital signs in last 24 hours: Temp:  [98.1 F (36.7 C)-99.3 F (37.4 C)] 98.1 F (36.7 C) (12/29 0453) Pulse Rate:  [61-70] 61 (12/29 0453) Resp:  [18] 18 (12/29 0453) BP: (131-154)/(48-57) 134/48 (12/29 0453) SpO2:  [92 %-96 %] 93 % (12/29 0453) Weight:  [48.7 kg-50.5 kg] 50.5 kg (12/29 0335) Last BM Date: 03/16/21  Intake/Output from previous day: 12/28 0701 - 12/29 0700 In: 1927.3 [P.O.:60; I.V.:1525.6; IV Piggyback:341.8] Out: 2405 [Urine:2000; Drains:405] Intake/Output this shift: No intake/output data recorded.  General appearance: alert and cooperative Resp: clear to auscultation bilaterally Cardio: regular rate and rhythm GI: soft, minimal tenderness. Drain output serous  Lab Results:  Recent Labs    03/16/21 0241 03/17/21 0317  WBC 11.0* 9.6  HGB 8.2* 8.2*  HCT 24.9* 25.2*  PLT 154 170   BMET Recent Labs    03/17/21 0317 03/18/21 0419  NA 138 138  K 4.4 4.4  CL 106 106  CO2 24 26  GLUCOSE 243* 130*  BUN 58* 73*  CREATININE 1.44* 1.53*  CALCIUM 8.4* 8.2*   PT/INR No results for input(s): LABPROT, INR in the last 72 hours. ABG No results for input(s): PHART, HCO3 in the last 72 hours.  Invalid input(s): PCO2, PO2  Studies/Results: CT ABDOMEN PELVIS WO CONTRAST  Result Date: 03/17/2021 CLINICAL DATA:  Postop day 12 from duodenal perforation. EXAM: CT ABDOMEN AND PELVIS WITHOUT CONTRAST TECHNIQUE: Multidetector CT imaging of the abdomen and pelvis was performed following the standard protocol without IV contrast. COMPARISON:  CT scan 03/05/2021 FINDINGS: Lower chest: Bilateral pleural effusions with overlying atelectasis. Advanced atherosclerotic calcifications involving the aorta and coronary arteries. Hepatobiliary: Stable left hepatic lobe cyst. The gallbladder is distended and contains a 2 cm gallstone. No pericholecystic fluid. Pancreas: No mass, inflammation  ductal dilatation. Spleen: Normal size. No focal lesions. Calcified granulomas are noted. Adrenals/Urinary Tract: Adrenal glands are unremarkable. Bilateral renal cysts are stable. No hydronephrosis. Bladder contains a Foley catheter. Stomach/Bowel: The stomach is largely decompressed. No ulcer or mass is identified. Is situated behind the transverse colon. Duodenum, small bowel and colon are grossly normal. Vascular/Lymphatic: Stable advanced vascular disease. Reproductive: No significant findings. Other: Abdominal drainage catheter in place without complicating features. No significant free abdominal or free pelvic fluid collections and no free air. Open anterior abdominal wound is noted. Musculoskeletal: Stable lumbar fusion hardware. IMPRESSION: 1. Abdominal drainage catheter in place without complicating features. No significant free abdominal or free pelvic fluid collections and no free air. 2. Distended gallbladder with a 2 cm gallstone. 3. Bilateral pleural effusions with overlying atelectasis. 4. Stable advanced vascular disease. Aortic Atherosclerosis (ICD10-I70.0). Electronically Signed   By: Marijo Sanes M.D.   On: 03/17/2021 15:20   DG CHEST PORT 1 VIEW  Result Date: 03/16/2021 CLINICAL DATA:  Wheezing, shortness of breath EXAM: PORTABLE CHEST 1 VIEW COMPARISON:  03/13/2021 FINDINGS: Transverse diameter of heart is increased. There are no signs of alveolar pulmonary edema or new focal infiltrates. There is improvement in aeration of both lower lung fields. There is blunting of both lateral CP angles. There is no pneumothorax. Tip of PICC line is seen in the region of superior vena cava. IMPRESSION: Cardiomegaly. There is interval improvement in aeration of both lower lung fields suggesting decrease in bilateral pleural effusions. There are no new focal pulmonary infiltrates. Small bilateral pleural effusions. Electronically Signed   By: Elmer Picker M.D.   On:  03/16/2021 14:16     Anti-infectives: Anti-infectives (From admission, onward)    Start     Dose/Rate Route Frequency Ordered Stop   03/06/21 0400  piperacillin-tazobactam (ZOSYN) IVPB 2.25 g        2.25 g 100 mL/hr over 30 Minutes Intravenous Every 6 hours 03/06/21 0216 03/12/21 1707   03/05/21 2115  piperacillin-tazobactam (ZOSYN) IVPB 3.375 g        3.375 g 100 mL/hr over 30 Minutes Intravenous  Once 03/05/21 2101 03/05/21 2203       Assessment/Plan: s/p Procedure(s): EXPLORATORY LAPAROTOMY and graham patch repair of perforated duodenal ulcer (N/A) Advance diet Will send drain fluid for amylase. If normal then d/c drain PT/OT Perforated duodenal ulcer S/P exploratory laparotomy with Phillip Heal patch repair 03/05/21 Dr. Marlou Starks - POD 12 - UGI without leak  - soft diet as tolerated - continue to mobilize with PT/OT - monitor JP output - no sign of bilious output; large serous output likely due to protein calorie malnutrition - off IV abx - PPI  BID IV - H pylori is negative    FEN: soft diet, TPN VTE: LMWH ID: off abx; WBC trending down   HTN: cont home meds, appreciate TRH assistance HLD CKD stage IV - Cr trending down today, baseline reportedly between 1.9 and 2.5 Carotid stenosis  Chronic anemia - hgb 8.2, continue to monitor  Moderate protein calorie malnutrition - TPN as listed above; encourage PO intake Physical deconditioning  Urinary retention- Foley replaced 12/25    Place foley for better decompression - add urecholine - continue Foley until 12/29 for ongoing diuresis   CT scan abd/ pelvis without IV contrast today to evaluate abdomen/ gastric anatomy for possible feeding tube placement since she is unable to take adequate PO intake to support her nutrition.  LOS: 13 days    Kathleen Jimenez 03/18/2021

## 2021-03-18 NOTE — TOC Progression Note (Signed)
Transition of Care [facility]) - Progression Note    Patient Details  Name: Kathleen Jimenez MRN: [id number] Date of Birth: December 04, 1931  Transition of Care [facility]) CM/SW Contact  Purcell Mouton, RN Phone Number: 03/18/2021, 3:50 PM  Clinical Narrative:     Will need PT notes for insurance company to approve SNF.  Expected Discharge Plan: Skilled Nursing Facility Barriers to Discharge: SNF Pending bed offer, Insurance Authorization  Expected Discharge Plan and Services Expected Discharge Plan: [place] In-house Referral: Clinical Social Work   Post Acute Care Choice: [place] Living arrangements for the past 2 months: [place]                 DME Arranged: N/A DME Agency: NA                   Social Determinants of Health (SDOH) Interventions    Readmission Risk Interventions Readmission Risk Prevention Plan 03/12/2021  Transportation Screening Complete  HRI or Home Care Consult Complete  Social Work Consult for [place] Planning/Counseling Complete  Palliative Care Screening Not Applicable  Medication Review [employer]) Complete  Some recent data might be hidden

## 2021-03-18 NOTE — Assessment & Plan Note (Addendum)
Likely in the setting of TPN. Bilirubin normal. Continue to monitor for now. AST and ALT in 200 range.  Now trending down.  2 near normal level.

## 2021-03-18 NOTE — Assessment & Plan Note (Signed)
Prior history.  On aspirin at home.  Currently on hold.  Also holding statin.

## 2021-03-18 NOTE — Assessment & Plan Note (Signed)
Management per primary team.

## 2021-03-18 NOTE — Assessment & Plan Note (Addendum)
Home blood pressure regimen includes hydralazine 25 twice daily, Coreg 6.25 twice daily, Norvasc 5 mg daily. Blood pressure mildly elevated. Will continue Norvasc 5 mg daily and add hydralazine.  Hydralazine dose increased.

## 2021-03-18 NOTE — Progress Notes (Signed)
Occupational Therapy Treatment Patient Details Name: Kathleen Jimenez MRN: [id number] DOB: 1931/04/20 Today's Date: 03/18/2021   History of present illness 87 yo female  s/p exploratory laparotomy for perforated duodenal ulcer on 12/16. PMH : IM nail, lumbar fusion, CKD   OT comments  Treatment focused on activity tolerance and self care task out of bed. Patient limited by pain and nausea. RN reports narcotics have been stopped due to patient's lethargy and confusion. Patient receiving IV tylenol. Patient max assist to transfer to side of bed, mod assist to rise from bed and min assist to take steps to recliner. Set up for grooming task. Patient moaning and grimacing. Will continue POC.    Recommendations for follow up therapy are one component of a multi-disciplinary discharge planning process, led by the attending physician.  Recommendations may be updated based on patient status, additional functional criteria and insurance authorization.    Follow Up Recommendations  Skilled nursing-short term rehab (<3 hours/day)    Assistance Recommended at Discharge Frequent or constant Supervision/Assistance  Equipment Recommendations  Other (comment) (defer to next venue)    Recommendations for Other Services      Precautions / Restrictions Precautions Precautions: Fall Precaution Comments: incontinent of urine and BM, JP drain, abdominal incision Restrictions Weight Bearing Restrictions: No        ADL either performed or assessed with clinical judgement   ADL Overall ADL's : Needs assistance/impaired     Grooming: Wash/dry face;Set up;Sitting Grooming Details (indicate cue type and reason): set up in recliner to wash face and apply lotion toface.                             Functional mobility during ADLs: Moderate assistance;Rolling walker (2 wheels) General ADL Comments: Patient limited by pain. Patient max assist to transfer to sitting a side of bed in preparation for  ADLs. Patient nauseated and dry heaving at edge of bed. Patinet mod assist to rise from elevated bed height and min assist to take a couple steps to recliner. Performed minimal grooming task in seated position out of bed.      Cognition Arousal/Alertness: Awake/alert Behavior During Therapy: Flat affect Overall Cognitive Status: No family/caregiver present to determine baseline cognitive functioning                                 General Comments: Knows she is in the hospital and she had surgery. Otherwise verbalizations minimal.                     Pertinent Vitals/ Pain       Pain Assessment: Faces Faces Pain Scale: Hurts even more Pain Location: Ex Lap site Pain Descriptors / Indicators: Aching;Discomfort;Grimacing;Moaning Pain Intervention(s): Limited activity within patient's tolerance;Monitored during session;Premedicated before session;Repositioned   Progress Toward Goals  OT Goals(current goals can now be found in the care plan section)  Progress towards OT goals: Not progressing toward goals - comment  Acute Rehab OT Goals OT Goal Formulation: Patient unable to participate in goal setting Potential to Achieve Goals: [place] Discharge plan remains appropriate    Co-evaluation                 AM-PAC OT "6 Clicks" Daily Activity     Outcome Measure   Help from another person eating meals?: A Little Help from another person taking  care of personal grooming?: A Little Help from another person toileting, which includes using toliet, bedpan, or urinal?: Total Help from another person bathing (including washing, rinsing, drying)?: A Lot Help from another person to put on and taking off regular upper body clothing?: A Lot Help from another person to put on and taking off regular lower body clothing?: Total 6 Click Score: 12    End of Session Equipment Utilized During Treatment: Rolling walker (2 wheels)  OT Visit Diagnosis: Unsteadiness  on feet (R26.81);Other symptoms and signs involving cognitive function;History of falling (Z91.81);Muscle weakness (generalized) (M62.81);Pain   Activity Tolerance Patient tolerated treatment well;Patient limited by pain   Patient Left in chair;with call bell/phone within reach;with chair alarm set;with nursing/sitter in room   Nurse Communication Mobility status        Time: 0902-0923 OT Time Calculation (min): 21 min  Charges: OT General Charges $OT Visit: 1 Visit OT Treatments $Self Care/Home Management : 8-22 mins  Derl Barrow, OTR/L Home  Office [phone] Pager: [place] 03/18/2021, 9:30 AM

## 2021-03-18 NOTE — Hospital Course (Signed)
Medical consultation for inpatient management of CKD stage IV, HTN, and dyslipidemia  87 yo female who presented with 1 day of abdominal pain, work up in the ED showed perforated viscus and she was taken urgently to the operating room.   12/16 exploratory laparotomy underwent washout and intra-abdominal drain. Placed on broad spectrum antibiotic therapy and transferred to the step down unit.  Patient developed agitation and delirium, and required precedex infusion.  12/20 NG tube removed  12/22 transferred to the med surg unit 12/27 received IV Lasix and steroids.

## 2021-03-18 NOTE — Assessment & Plan Note (Signed)
Management per primary team surgery.

## 2021-03-18 NOTE — Assessment & Plan Note (Signed)
On Crestor at home.  Currently on hold.

## 2021-03-18 NOTE — Progress Notes (Signed)
°  Progress Note   Patient: Kathleen Jimenez MCN:[id number] DOB: Jan 22, 1932 DOA: 03/05/2021     13 DOS: the patient was seen and examined on 03/18/2021   Brief hospital course: Medical consultation for inpatient management of CKD stage IV, HTN, and dyslipidemia  87 yo female who presented with 1 day of abdominal pain, work up in the ED showed perforated viscus and she was taken urgently to the operating room.   12/16 exploratory laparotomy underwent washout and intra-abdominal drain. Placed on broad spectrum antibiotic therapy and transferred to the step down unit.  Patient developed agitation and delirium, and required precedex infusion.  12/20 NG tube removed  12/22 transferred to the med surg unit 12/27 received IV Lasix and steroids.  Assessment and Plan * Perforated abdominal viscus- (present on admission) Management per primary team.  Duodenal ulcer disease- (present on admission) Management per primary team surgery.  Acute respiratory failure ([place])- (present on admission) 87% on room air on 12/16. Currently oxygenation improved.  Essential hypertension- (present on admission) Blood pressure was elevated, now improving. Currently on Norvasc 5 mg daily. Patient received intermittent diuresis.  Currently holding.  Acute renal failure superimposed on stage 4 chronic kidney disease ([place])- (present on admission) Baseline serum creatinine around 1.7.  On presentation serum creatinine 2.  Initially worsened.  Currently improving to 1.5.  BUN still elevated.  Monitor.  Malnutrition of moderate degree- (present on admission) From poor p.o. intake postoperatively. Monitor.  Dietitian following.  Continue supplements.  Patient requires feeding assistance.  Transaminitis- (present on admission) Likely in the setting of TPN. Bilirubin normal. Continue to monitor for now. AST and ALT in 200 range.  Hyperlipidemia- (present on admission) On Crestor at home.  Currently on  hold.  Carotid stenosis- (present on admission) Prior history.  On aspirin at home.  Currently on hold.  Also holding statin.  Postoperative anemia due to acute blood loss- (present on admission) H&H relatively stable.  We will continue to monitor intermittently.  Transfuse for hemoglobin less than 7.     Subjective: No nausea no vomiting, less agitated, more interactive.  No complaint of pain.  Objective Vital signs were reviewed and unremarkable. General: Appear in mild distress, no Rash; Oral Mucosa Clear, moist. no Abnormal Neck Mass Or lumps, Conjunctiva normal  Cardiovascular: S1 and S2 Present, no Murmur, Respiratory: good respiratory effort, Bilateral Air entry present and CTA, no Crackles, no wheezes Abdomen: Bowel Sound present, Soft and mild tenderness Extremities: no Pedal edema Neurology: alert and oriented to time, place, and person affect appropriate. no new focal deficit Gait not checked due to patient safety concerns   Data Reviewed: Serum creatinine stable.  BUN worsening.  Family Communication: Family at bedside.  Disposition: Status is: Inpatient  Remains inpatient appropriate because: Poor p.o. intake, ongoing requirement for close monitoring for LFT and pain control.         Time spent: 35 minutes  Author: Berle Mull 03/18/2021 6:28 PM  For on call review [email or web address].

## 2021-03-18 NOTE — Assessment & Plan Note (Addendum)
Baseline serum creatinine around 1.7.   On presentation serum creatinine 2.   Initially worsened, peaked at 2.49.  Currently stable at around 1.4. BUN still elevated.  Now trending down.  Monitor.

## 2021-03-18 NOTE — Assessment & Plan Note (Addendum)
On top of chronic anemia from chronic blood loss as well as CKD. Has baseline hemoglobin of around 9. On presentation hemoglobin of 9.6.  Postoperatively dropped down to 6.8.  Required 1 PRBC transfusion. Currently dropping likely from frequent blood draws. Monitor CBC biweekly. Transfuse for hemoglobin less than 7.

## 2021-03-18 NOTE — Assessment & Plan Note (Addendum)
87% on room air on 12/16. Currently oxygenation improved. Now on room air.

## 2021-03-19 DIAGNOSIS — R198 Other specified symptoms and signs involving the digestive system and abdomen: Secondary | ICD-10-CM | POA: Diagnosis not present

## 2021-03-19 LAB — COMPREHENSIVE METABOLIC PANEL
ALT: 235 U/L — ABNORMAL HIGH (ref 0–44)
AST: 140 U/L — ABNORMAL HIGH (ref 15–41)
Albumin: 2.9 g/dL — ABNORMAL LOW (ref 3.5–5.0)
Alkaline Phosphatase: 69 U/L (ref 38–126)
Anion gap: 6 (ref 5–15)
BUN: 76 mg/dL — ABNORMAL HIGH (ref 8–23)
CO2: 25 mmol/L (ref 22–32)
Calcium: 8.2 mg/dL — ABNORMAL LOW (ref 8.9–10.3)
Chloride: 108 mmol/L (ref 98–111)
Creatinine, Ser: 1.55 mg/dL — ABNORMAL HIGH (ref 0.44–1.00)
GFR, Estimated: 32 mL/min — ABNORMAL LOW (ref >60)
Glucose, Bld: 132 mg/dL — ABNORMAL HIGH (ref 70–99)
Potassium: 4.4 mmol/L (ref 3.5–5.1)
Sodium: 139 mmol/L (ref 135–145)
Total Bilirubin: 0.4 mg/dL (ref 0.3–1.2)
Total Protein: 4.9 g/dL — ABNORMAL LOW (ref 6.5–8.1)

## 2021-03-19 LAB — GLUCOSE, CAPILLARY
Glucose-Capillary: 129 mg/dL — ABNORMAL HIGH (ref 70–99)
Glucose-Capillary: 125 mg/dL — ABNORMAL HIGH (ref 70–99)
Glucose-Capillary: 138 mg/dL — ABNORMAL HIGH (ref 70–99)

## 2021-03-19 LAB — MAGNESIUM: Magnesium: 2.4 mg/dL (ref 1.7–2.4)

## 2021-03-19 LAB — PHOSPHORUS: Phosphorus: 3.4 mg/dL (ref 2.5–4.6)

## 2021-03-19 MED ORDER — MELATONIN 3 MG PO TABS
3.0000 mg | ORAL_TABLET | Freq: Every day | ORAL | Status: DC
  Administered 2021-03-19 – 2021-03-29 (×11): 3 mg via ORAL
  Filled 2021-03-19 (×11): qty 1

## 2021-03-19 MED ORDER — TRAVASOL 10 % IV SOLN
INTRAVENOUS | Status: AC
  Filled 2021-03-19: qty 763.2

## 2021-03-19 NOTE — Progress Notes (Signed)
PHARMACY - TOTAL PARENTERAL NUTRITION CONSULT NOTE   Indication:  Intolerance to enteral feeding  Patient Measurements: Height: 5\' 3"  (160 cm) Weight: 51.3 kg (113 lb 1.5 oz) IBW/kg (Calculated) : 52.4 TPN AdjBW (KG): 48.1 Body mass index is 20.03 kg/m.  Assessment: 87 yo female with PMH hypertension, carotid stenosis, chronic renal insuffiencey & anemia admitted on 12/16 with abdominal pain.  CT revealed perforated viscus and was taken to the OR for gastric repair of perforated duodenal ulcer with washout and placement of intra-abdominal drain.  Pharmacy is consulted to start TPN on 12/20.  Glucose / Insulin: No hx DM. - on sensitive SSI q6h (used 6 units in 24 hrs) - cbgs (goal <150): 115-146 Electrolytes: - CorrCa 9; other lytes wnl  Renal: Hx CKD, SCr up 1.55 (below baseline 1.9), BUN elevated at 76 Hepatic: AST/ALT decreased some 140/235; Tbili wnl, Alb 2.9. Prealbumin 8.5 on 12/19.  Triglycerides: WNL (43 on 12/26) Intake / Output: - 141 ml - UOP 1400 mL , Drain output 220  ml MIVF: none GI Imaging: - 12/16 CT abd Moderate free air with mild-to-moderate free fluid in the abdomen. Findings consistent with perforated hollow viscus.  - 12/28 abd CT: No significant free abdominal or free pelvic fluid collections and no free air. Distended gallbladder with a 2 cm gallstone. Bilateral pleural effusions with overlying atelectasis GI Surgeries / Procedures:  - 12/17 exploratory laparotomy and graham patch repair for perforated duodenal ulcer  Central access: PICC placed 12/20 TPN start date: 12/20  Nutritional Goals: Goal TPN rate is 60 mL/hr (provides 76 g of protein and 1498 kcals per day)  RD Assessment: pending Estimated Needs Total Energy Estimated Needs: 1500-1700 kcal Total Protein Estimated Needs: 75-90 grams Total Fluid Estimated Needs: >/= 1.7 L/day  Current Nutrition:  TPN Dypshagia 3 12/27 > (still poor PO intake) Glucerna shake TID (10 g of protein and 220  kcal each) *Refusing so far except for twice yesterday  Plan:   At 1800:  - continue TPN at reduced rate of 74mL/hr to slightly reduce protein, dextrose, and lipid components which may help with transaminitis. Still meeting bottom of range for estimated needs with just parenteral nutrition. - Electrolytes in TPN:  Na 50 mEq/L  K 35 mEq/L Ca 5 mEq/L  Reduce Mg to 2.5 mEq/L  Reduce Phos to 5 mmol/L  Cl:Ac 1:2 - Add standard MVI and trace elements to TPN - continue sensitive SSI Q6h - Standard TPN labs on Mon/Thurs - CMET, phos and mag on 12/31  Maximizing enteral nutrition may also help transaminitis. Recommend continued encouragement of enteral nutrition.  Ulice Dash D  03/19/2021 7:51 AM

## 2021-03-19 NOTE — Progress Notes (Signed)
Physical Therapy Treatment Patient Details Name: Kathleen Jimenez MRN: [id number] DOB: 01-02-1932 Today's Date: 03/19/2021   History of Present Illness 87 yo female  s/p exploratory laparotomy for perforated duodenal ulcer on 12/16. PMH : IM nail, lumbar fusion, CKD    PT Comments    Pt just returned to bed with nursing after being up in the recliner. She declined further mobility as she stated she's too fatigued. She agreed to bed level LE exercises. Pt performed BLE exercises with assistance. She fatigues quickly.     Recommendations for follow up therapy are one component of a multi-disciplinary discharge planning process, led by the attending physician.  Recommendations may be updated based on patient status, additional functional criteria and insurance authorization.  Follow Up Recommendations  Skilled nursing-short term rehab (<3 hours/day)     Assistance Recommended at Discharge Frequent or constant Supervision/Assistance  Equipment Recommendations  None recommended by PT    Recommendations for Other Services       Precautions / Restrictions Precautions Precautions: Fall Precaution Comments: incontinent of urine and BM, JP drain, abdominal incision Restrictions Weight Bearing Restrictions: No     Mobility  Bed Mobility               General bed mobility comments: Pt just got back to bed with nursing after being up in the recliner. She reported she's too fatigued to attempt further mobility. She agreed to bed level exercises.    Transfers                        Ambulation/Gait                   Stairs             Wheelchair Mobility    Modified Rankin (Stroke Patients Only)       Balance                                            Cognition Arousal/Alertness: Awake/alert Behavior During Therapy: Flat affect Overall Cognitive Status: No family/caregiver present to determine baseline cognitive functioning                                  General Comments: Knows she is in the hospital and she had surgery. Otherwise verbalizations minimal.        Exercises General Exercises - Lower Extremity Ankle Circles/Pumps: AAROM;Both;10 reps;Supine Short Arc Quad: AAROM;Both;10 reps;Supine Heel Slides: AAROM;Both;10 reps;Supine Hip ABduction/ADduction: AAROM;Both;10 reps;Supine    General Comments        Pertinent Vitals/Pain Pain Assessment: No/denies pain    Home Living                          Prior Function            PT Goals (current goals can now be found in the care plan section) Acute Rehab PT Goals Patient Stated Goal: to feel better PT Goal Formulation: With patient Time For Goal Achievement: 03/22/21 Potential to Achieve Goals: Good Progress towards PT goals: Progressing toward goals    Frequency    Min 2X/week      PT Plan Current plan remains appropriate    Co-evaluation  AM-PAC PT "6 Clicks" Mobility   Outcome Measure  Help needed turning from your back to your side while in a flat bed without using bedrails?: A Little Help needed moving from lying on your back to sitting on the side of a flat bed without using bedrails?: A Lot Help needed moving to and from a bed to a chair (including a wheelchair)?: A Lot Help needed standing up from a chair using your arms (e.g., wheelchair or bedside chair)?: A Little Help needed to walk in hospital room?: Total Help needed climbing 3-5 steps with a railing? : Total 6 Click Score: 12    End of Session   Activity Tolerance: Patient limited by fatigue Patient left: in bed;with call bell/phone within reach;with bed alarm set Nurse Communication: Mobility status PT Visit Diagnosis: Other abnormalities of gait and mobility (R26.89);Muscle weakness (generalized) (M62.81);Difficulty in walking, not elsewhere classified (R26.2)     Time: 0867-6195 PT Time Calculation (min) (ACUTE  ONLY): 9 min  Charges:  $Therapeutic Exercise: 8-22 mins                     Philomena Doheny PT 03/19/2021  Acute Rehabilitation Services Pager [phone] Office [phone]

## 2021-03-19 NOTE — Progress Notes (Signed)
14 Days Post-Op   Subjective/Chief Complaint: No complaints today. More alert and in good spirits   Objective: Vital signs in last 24 hours: Temp:  [97.8 F (36.6 C)-98.3 F (36.8 C)] 98.1 F (36.7 C) (12/30 0600) Pulse Rate:  [57-72] 57 (12/30 0600) Resp:  [14-20] 20 (12/30 0600) BP: (121-155)/(40-63) 155/63 (12/30 0600) SpO2:  [93 %-100 %] 100 % (12/30 0600) Weight:  [51.3 kg] 51.3 kg (12/30 0500) Last BM Date: 03/17/21  Intake/Output from previous day: 12/29 0701 - 12/30 0700 In: 1478.8 [P.O.:280; I.V.:1198.8] Out: 1620 [Urine:1400; Drains:220] Intake/Output this shift: No intake/output data recorded.  General appearance: alert and cooperative Resp: clear to auscultation bilaterally Cardio: regular rate and rhythm GI: soft, nontender. Drain removed yesterday  Lab Results:  Recent Labs    03/17/21 0317  WBC 9.6  HGB 8.2*  HCT 25.2*  PLT 170   BMET Recent Labs    03/18/21 0419 03/19/21 0413  NA 138 139  K 4.4 4.4  CL 106 108  CO2 26 25  GLUCOSE 130* 132*  BUN 73* 76*  CREATININE 1.53* 1.55*  CALCIUM 8.2* 8.2*   PT/INR No results for input(s): LABPROT, INR in the last 72 hours. ABG No results for input(s): PHART, HCO3 in the last 72 hours.  Invalid input(s): PCO2, PO2  Studies/Results: CT ABDOMEN PELVIS WO CONTRAST  Result Date: 03/17/2021 CLINICAL DATA:  Postop day 12 from duodenal perforation. EXAM: CT ABDOMEN AND PELVIS WITHOUT CONTRAST TECHNIQUE: Multidetector CT imaging of the abdomen and pelvis was performed following the standard protocol without IV contrast. COMPARISON:  CT scan 03/05/2021 FINDINGS: Lower chest: Bilateral pleural effusions with overlying atelectasis. Advanced atherosclerotic calcifications involving the aorta and coronary arteries. Hepatobiliary: Stable left hepatic lobe cyst. The gallbladder is distended and contains a 2 cm gallstone. No pericholecystic fluid. Pancreas: No mass, inflammation ductal dilatation. Spleen: Normal  size. No focal lesions. Calcified granulomas are noted. Adrenals/Urinary Tract: Adrenal glands are unremarkable. Bilateral renal cysts are stable. No hydronephrosis. Bladder contains a Foley catheter. Stomach/Bowel: The stomach is largely decompressed. No ulcer or mass is identified. Is situated behind the transverse colon. Duodenum, small bowel and colon are grossly normal. Vascular/Lymphatic: Stable advanced vascular disease. Reproductive: No significant findings. Other: Abdominal drainage catheter in place without complicating features. No significant free abdominal or free pelvic fluid collections and no free air. Open anterior abdominal wound is noted. Musculoskeletal: Stable lumbar fusion hardware. IMPRESSION: 1. Abdominal drainage catheter in place without complicating features. No significant free abdominal or free pelvic fluid collections and no free air. 2. Distended gallbladder with a 2 cm gallstone. 3. Bilateral pleural effusions with overlying atelectasis. 4. Stable advanced vascular disease. Aortic Atherosclerosis (ICD10-I70.0). Electronically Signed   By: Marijo Sanes M.D.   On: 03/17/2021 15:20    Anti-infectives: Anti-infectives (From admission, onward)    Start     Dose/Rate Route Frequency Ordered Stop   03/06/21 0400  piperacillin-tazobactam (ZOSYN) IVPB 2.25 g        2.25 g 100 mL/hr over 30 Minutes Intravenous Every 6 hours 03/06/21 0216 03/12/21 1707   03/05/21 2115  piperacillin-tazobactam (ZOSYN) IVPB 3.375 g        3.375 g 100 mL/hr over 30 Minutes Intravenous  Once 03/05/21 2101 03/05/21 2203       Assessment/Plan: s/p Procedure(s): EXPLORATORY LAPAROTOMY and graham patch repair of perforated duodenal ulcer (N/A) Advance diet PT/OT Perforated duodenal ulcer S/P exploratory laparotomy with Kathleen Jimenez patch repair 03/05/21 Dr. Marlou Jimenez - POD 13 - UGI without  leak  - soft diet as tolerated - continue to mobilize with PT/OT - off IV abx - PPI  BID IV - H pylori is  negative    FEN: soft diet, TPN VTE: LMWH ID: off abx; WBC trending down   HTN: cont home meds, appreciate TRH assistance HLD CKD stage IV - Cr trending down today, baseline reportedly between 1.9 and 2.5 Carotid stenosis  Chronic anemia - hgb 8.2, continue to monitor  Moderate protein calorie malnutrition - TPN as listed above; encourage PO intake Physical deconditioning  Urinary retention- Foley replaced 12/25    Place foley for better decompression - add urecholine - continue Foley until 12/29 for ongoing diuresis    LOS: 14 days    Kathleen Jimenez 03/19/2021

## 2021-03-19 NOTE — Progress Notes (Signed)
°  Progress Note   Patient: Kathleen Jimenez [id number]:[id number] DOB: February 11, 1932 DOA: 03/05/2021     14 DOS: the patient was seen and examined on 03/19/2021   Brief hospital course: Medical consultation for inpatient management of CKD stage IV, HTN, and dyslipidemia  87 yo female who presented with 1 day of abdominal pain, work up in the ED showed perforated viscus and she was taken urgently to the operating room.   12/16 exploratory laparotomy underwent washout and intra-abdominal drain. Placed on broad spectrum antibiotic therapy and transferred to the step down unit.  Patient developed agitation and delirium, and required precedex infusion.  12/20 NG tube removed  12/22 transferred to the med surg unit 12/27 received IV Lasix and steroids.  Assessment and Plan * Perforated abdominal viscus- (present on admission) Management per primary team.  Duodenal ulcer disease- (present on admission) Management per primary team surgery.  Acute respiratory failure ([place])- (present on admission) 87% on room air on 12/16. Currently oxygenation improved. Now on room air.  Essential hypertension- (present on admission) Blood pressure was elevated, now improving. Currently on Norvasc 5 mg daily.  Acute renal failure superimposed on stage 4 chronic kidney disease ([place])- (present on admission) Baseline serum creatinine around 1.7.   On presentation serum creatinine 2.   Initially worsened.  Currently improving. BUN still elevated.  Monitor.  Malnutrition of moderate degree- (present on admission) From poor p.o. intake postoperatively. Dietitian following.  Continue supplements.  Patient requires feeding assistance. Body mass index is 20.03 kg/m.  Nutrition Problem: Moderate Malnutrition Etiology: chronic illness Nutrition Interventions: Interventions: TPN    Transaminitis- (present on admission) Likely in the setting of TPN. Bilirubin normal. Continue to monitor for now. AST and ALT in 200  range.  Hyperlipidemia- (present on admission) On Crestor at home.  Currently on hold.  Carotid stenosis- (present on admission) Prior history.  On aspirin at home.  Currently on hold.  Also holding statin.  Postoperative anemia due to acute blood loss- (present on admission) H&H relatively stable.  We will continue to monitor intermittently. Transfuse for hemoglobin less than 7.     Subjective: feeling better no acute complains.  Objective Vital signs were reviewed and unremarkable. General: Appear in mild distress; Oral Mucosa Clear, moist. Cardiovascular: S1 and S2 Present, no Murmur, Respiratory: good respiratory effort, Bilateral Air entry present and CTA, no Crackles, no wheezes Abdomen: Bowel Sound present, mild tenderness Extremities: no Pedal edema Neurology: alert and oriented to time, place, and person affect appropriate.  Data Reviewed: Improving LFT  Family Communication: none at bedside  Disposition: Status is: Inpatient    Time spent: 20 minutes  Author: Berle Mull 03/19/2021 8:10 PM  For on call review [email or web address].

## 2021-03-19 NOTE — Care Management Important Message (Signed)
Important Message  Patient Details IM Letter placed in Patients room. Name: Kathleen Jimenez MRN: [id number] Date of Birth: 02-Apr-1931   Medicare Important Message Given:  Yes     Kerin Salen 03/19/2021, 11:09 AM

## 2021-03-20 ENCOUNTER — Inpatient Hospital Stay (HOSPITAL_COMMUNITY): Payer: PPO

## 2021-03-20 DIAGNOSIS — K269 Duodenal ulcer, unspecified as acute or chronic, without hemorrhage or perforation: Secondary | ICD-10-CM | POA: Diagnosis not present

## 2021-03-20 LAB — GLUCOSE, CAPILLARY
Glucose-Capillary: 121 mg/dL — ABNORMAL HIGH (ref 70–99)
Glucose-Capillary: 123 mg/dL — ABNORMAL HIGH (ref 70–99)
Glucose-Capillary: 138 mg/dL — ABNORMAL HIGH (ref 70–99)
Glucose-Capillary: 144 mg/dL — ABNORMAL HIGH (ref 70–99)
Glucose-Capillary: 158 mg/dL — ABNORMAL HIGH (ref 70–99)

## 2021-03-20 LAB — COMPREHENSIVE METABOLIC PANEL
ALT: 159 U/L — ABNORMAL HIGH (ref 0–44)
AST: 65 U/L — ABNORMAL HIGH (ref 15–41)
Albumin: 2.9 g/dL — ABNORMAL LOW (ref 3.5–5.0)
Alkaline Phosphatase: 72 U/L (ref 38–126)
Anion gap: 6 (ref 5–15)
BUN: 70 mg/dL — ABNORMAL HIGH (ref 8–23)
CO2: 25 mmol/L (ref 22–32)
Calcium: 8.5 mg/dL — ABNORMAL LOW (ref 8.9–10.3)
Chloride: 109 mmol/L (ref 98–111)
Creatinine, Ser: 1.4 mg/dL — ABNORMAL HIGH (ref 0.44–1.00)
GFR, Estimated: 36 mL/min — ABNORMAL LOW (ref >60)
Glucose, Bld: 140 mg/dL — ABNORMAL HIGH (ref 70–99)
Potassium: 4.1 mmol/L (ref 3.5–5.1)
Sodium: 140 mmol/L (ref 135–145)
Total Bilirubin: 0.6 mg/dL (ref 0.3–1.2)
Total Protein: 4.9 g/dL — ABNORMAL LOW (ref 6.5–8.1)

## 2021-03-20 LAB — MAGNESIUM: Magnesium: 2.2 mg/dL (ref 1.7–2.4)

## 2021-03-20 LAB — PHOSPHORUS: Phosphorus: 2.9 mg/dL (ref 2.5–4.6)

## 2021-03-20 LAB — CBC
HCT: 23.2 % — ABNORMAL LOW (ref 36.0–46.0)
Hemoglobin: 7.3 g/dL — ABNORMAL LOW (ref 12.0–15.0)
MCH: 31.2 pg (ref 26.0–34.0)
MCHC: 31.5 g/dL (ref 30.0–36.0)
MCV: 99.1 fL (ref 80.0–100.0)
Platelets: 222 10*3/uL (ref 150–400)
RBC: 2.34 MIL/uL — ABNORMAL LOW (ref 3.87–5.11)
RDW: 13.4 % (ref 11.5–15.5)
WBC: 9 10*3/uL (ref 4.0–10.5)
nRBC: 0 % (ref 0.0–0.2)

## 2021-03-20 MED ORDER — MENTHOL 3 MG MT LOZG: 1.0000 | LOZENGE | OROMUCOSAL | Status: DC | PRN

## 2021-03-20 MED ORDER — CALCIUM POLYCARBOPHIL 625 MG PO TABS
625.0000 mg | ORAL_TABLET | Freq: Two times a day (BID) | ORAL | Status: DC
  Administered 2021-03-20 – 2021-03-30 (×21): 625 mg via ORAL
  Filled 2021-03-20 (×22): qty 1

## 2021-03-20 MED ORDER — ALUM & MAG HYDROXIDE-SIMETH 200-200-20 MG/5ML PO SUSP: 30.0000 mL | Freq: Four times a day (QID) | ORAL | Status: DC | PRN

## 2021-03-20 MED ORDER — SIMETHICONE 40 MG/0.6ML PO SUSP
80.0000 mg | Freq: Four times a day (QID) | ORAL | Status: DC | PRN
  Filled 2021-03-20: qty 1.2

## 2021-03-20 MED ORDER — CARVEDILOL 3.125 MG PO TABS
3.1250 mg | ORAL_TABLET | Freq: Two times a day (BID) | ORAL | Status: DC
  Administered 2021-03-21: 3.125 mg via ORAL
  Filled 2021-03-20: qty 1

## 2021-03-20 MED ORDER — VITAMIN D 25 MCG (1000 UNIT) PO TABS
2000.0000 [IU] | ORAL_TABLET | Freq: Every day | ORAL | Status: DC
Start: 2021-03-20 — End: 2021-03-30
  Administered 2021-03-20 – 2021-03-30 (×11): 2000 [IU] via ORAL
  Filled 2021-03-20 (×11): qty 2

## 2021-03-20 MED ORDER — ROSUVASTATIN CALCIUM 10 MG PO TABS
10.0000 mg | ORAL_TABLET | Freq: Every day | ORAL | Status: DC
  Administered 2021-03-20 – 2021-03-30 (×11): 10 mg via ORAL
  Filled 2021-03-20 (×11): qty 1

## 2021-03-20 MED ORDER — CARVEDILOL 6.25 MG PO TABS: 6.2500 mg | ORAL_TABLET | Freq: Two times a day (BID) | ORAL | Status: DC

## 2021-03-20 MED ORDER — TRAVASOL 10 % IV SOLN
INTRAVENOUS | Status: AC
  Filled 2021-03-20: qty 763.2

## 2021-03-20 MED ORDER — PHENOL 1.4 % MT LIQD: 2.0000 | OROMUCOSAL | Status: DC | PRN

## 2021-03-20 MED ORDER — MAGIC MOUTHWASH
15.0000 mL | Freq: Four times a day (QID) | ORAL | Status: DC | PRN
  Filled 2021-03-20: qty 15

## 2021-03-20 MED ORDER — LIP MEDEX EX OINT: 1.0000 "application " | TOPICAL_OINTMENT | Freq: Two times a day (BID) | CUTANEOUS | Status: DC

## 2021-03-20 NOTE — Progress Notes (Signed)
°  Progress Note   Patient: Kathleen Jimenez EVO:[id number] DOB: 1931/04/15 DOA: 03/05/2021     15 DOS: the patient was seen and examined on 03/20/2021   Brief hospital course: Medical consultation for inpatient management of CKD stage IV, HTN, and dyslipidemia  87 yo female who presented with 1 day of abdominal pain, work up in the ED showed perforated viscus and she was taken urgently to the operating room.   12/16 exploratory laparotomy underwent washout and intra-abdominal drain. Placed on broad spectrum antibiotic therapy and transferred to the step down unit.  Patient developed agitation and delirium, and required precedex infusion.  12/20 NG tube removed  12/22 transferred to the med surg unit 12/27 received IV Lasix and steroids.  Assessment and Plan * Perforated abdominal viscus- (present on admission) Management per primary team.  Duodenal ulcer disease- (present on admission) Management per primary team surgery.  Postoperative anemia due to acute blood loss- (present on admission) On top of chronic anemia from chronic blood loss as well as CKD. Has baseline hemoglobin of around 9. On presentation hemoglobin of 9.6.  Postoperatively dropped down to 6.8.  Required 1 PRBC transfusion. Currently dropping 7.3 likely from frequent blood draws. Will monitor daily CBC for now. Transfuse for hemoglobin less than 7.  Essential hypertension- (present on admission) Blood pressure was elevated, now improving. Currently on Norvasc 5 mg daily.  Acute renal failure superimposed on stage 4 chronic kidney disease ([place])- (present on admission) Baseline serum creatinine around 1.7.   On presentation serum creatinine 2.   Initially worsened, peaked at 2.49.  Currently improving. BUN still elevated.  Now trending down.  Monitor.  Malnutrition of moderate degree- (present on admission) From poor p.o. intake postoperatively. Dietitian following.  Continue supplements.  Patient requires  feeding assistance. Body mass index is 20.03 kg/m.  Nutrition Problem: Moderate Malnutrition Etiology: chronic illness Nutrition Interventions: Interventions: TPN    Transaminitis- (present on admission) Likely in the setting of TPN. Bilirubin normal. Continue to monitor for now. AST and ALT in 200 range.  Now trending down.  Hyperlipidemia- (present on admission) On Crestor at home.  Currently on hold.  Acute respiratory failure ([place])- (present on admission) 87% on room air on 12/16. Currently oxygenation improved. Now on room air.  Carotid stenosis- (present on admission) Prior history.  On aspirin at home.  Currently on hold.  Also holding statin.     Subjective: Reports right wrist pain.  No nausea no vomiting but no fever no chills Somewhat agitated today but no evidence of confusion.  Objective Vital signs were reviewed and unremarkable. General: Appear in mild distress, no Rash; Oral Mucosa Clear, moist. Cardiovascular: S1 and S2 Present, no Murmur, Respiratory: good respiratory effort, Bilateral Air entry present and CTA, no Crackles, no wheezes Abdomen: Bowel Sound sluggish, Soft and no tenderness Extremities: no Pedal edema Neurology: alert and oriented to place, and person affect agitated. no new focal deficit Gait not checked due to patient safety concerns   Data Reviewed: X-ray of wrist shows no acute fracture but arthritic changes.  Family Communication: None at bedside.     Time spent: 35 minutes  Author: Berle Mull 03/20/2021 5:39 PM  For on call review [email or web address].

## 2021-03-20 NOTE — Progress Notes (Signed)
15 Days Post-Op   Subjective/Chief Complaint: Complains of pain in right hand. No apparent trauma to area   Objective: Vital signs in last 24 hours: Temp:  [98.2 F (36.8 C)-98.6 F (37 C)] 98.6 F (37 C) (12/31 0601) Pulse Rate:  [54-110] 79 (12/31 0606) Resp:  [14-18] 18 (12/31 0601) BP: (136-169)/(43-61) 169/59 (12/31 0601) SpO2:  [98 %-99 %] 99 % (12/31 0606) Weight:  [51.9 kg] 51.9 kg (12/31 0500) Last BM Date: 03/19/21  Intake/Output from previous day: 12/30 0701 - 12/31 0700 In: 1734.4 [P.O.:300; I.V.:1434.4] Out: 3150 [Urine:3150] Intake/Output this shift: No intake/output data recorded.  General appearance: alert and cooperative Resp: clear to auscultation bilaterally Cardio: regular rate and rhythm GI: soft, nontender. Wound clean with good granulation  Lab Results:  Recent Labs    03/20/21 0407  WBC 9.0  HGB 7.3*  HCT 23.2*  PLT 222   BMET Recent Labs    03/19/21 0413 03/20/21 0407  NA 139 140  K 4.4 4.1  CL 108 109  CO2 25 25  GLUCOSE 132* 140*  BUN 76* 70*  CREATININE 1.55* 1.40*  CALCIUM 8.2* 8.5*   PT/INR No results for input(s): LABPROT, INR in the last 72 hours. ABG No results for input(s): PHART, HCO3 in the last 72 hours.  Invalid input(s): PCO2, PO2  Studies/Results: No results found.  Anti-infectives: Anti-infectives (From admission, onward)    Start     Dose/Rate Route Frequency Ordered Stop   03/06/21 0400  piperacillin-tazobactam (ZOSYN) IVPB 2.25 g        2.25 g 100 mL/hr over 30 Minutes Intravenous Every 6 hours 03/06/21 0216 03/12/21 1707   03/05/21 2115  piperacillin-tazobactam (ZOSYN) IVPB 3.375 g        3.375 g 100 mL/hr over 30 Minutes Intravenous  Once 03/05/21 2101 03/05/21 2203       Assessment/Plan: s/p Procedure(s): EXPLORATORY LAPAROTOMY and graham patch repair of perforated duodenal ulcer (N/A) Advance diet. Encourage po's PT/OT Perforated duodenal ulcer S/P exploratory laparotomy with Phillip Heal  patch repair 03/05/21 Dr. Marlou Starks - POD 14 - UGI without leak  - soft diet as tolerated - continue to mobilize with PT/OT - off IV abx - PPI  BID IV - H pylori is negative    FEN: soft diet, TPN VTE: LMWH ID: off abx; WBC normal   HTN: cont home meds, appreciate TRH assistance HLD CKD stage IV - Cr trending down today, baseline reportedly between 1.9 and 2.5 Carotid stenosis  Chronic anemia - hgb 8.2, continue to monitor  Moderate protein calorie malnutrition - TPN as listed above; encourage PO intake Physical deconditioning  Urinary retention- Foley replaced 12/25    Place foley for better decompression - add urecholine - continue Foley until 12/29 for ongoing diuresis  LOS: 15 days    Kathleen Jimenez 03/20/2021

## 2021-03-20 NOTE — Progress Notes (Signed)
PHARMACY - TOTAL PARENTERAL NUTRITION CONSULT NOTE   Indication:  Intolerance to enteral feeding  Patient Measurements: Height: 5\' 3"  (160 cm) Weight: 51.9 kg (114 lb 6.7 oz) IBW/kg (Calculated) : 52.4 TPN AdjBW (KG): 48.1 Body mass index is 20.27 kg/m.  Assessment: 87 yo female with PMH hypertension, carotid stenosis, chronic renal insuffiencey & anemia admitted on 12/16 with abdominal pain.  CT revealed perforated viscus and was taken to the OR for gastric repair of perforated duodenal ulcer with washout and placement of intra-abdominal drain.  Pharmacy is consulted to start TPN on 12/20.  Glucose / Insulin: No hx DM. - on sensitive SSI q6h (used 6 units in 24 hrs) - cbgs (goal <150): 125-158 Electrolytes: - CorrCa 9.4; other lytes wnl  Renal: Hx CKD, SCr up 1.4 (below baseline 1.9), BUN elevated at 70 Hepatic: AST/ALT decreased some 65/159; Tbili wnl, Alb 2.9. Prealbumin 8.5 on 12/19.  Triglycerides: WNL (43 on 12/26) Intake / Output: - 1415.7 ml - UOP 3150mL , Drain output: none reported MIVF: none GI Imaging: - 12/16 CT abd Moderate free air with mild-to-moderate free fluid in the abdomen. Findings consistent with perforated hollow viscus.  - 12/28 abd CT: No significant free abdominal or free pelvic fluid collections and no free air. Distended gallbladder with a 2 cm gallstone. Bilateral pleural effusions with overlying atelectasis GI Surgeries / Procedures:  - 12/17 exploratory laparotomy and graham patch repair for perforated duodenal ulcer  Central access: PICC placed 12/20 TPN start date: 12/20  Nutritional Goals: Goal TPN rate is 60 mL/hr (provides 76 g of protein and 1498 kcals per day)  RD Assessment: pending Estimated Needs Total Energy Estimated Needs: 1500-1700 kcal Total Protein Estimated Needs: 75-90 grams Total Fluid Estimated Needs: >/= 1.7 L/day  Current Nutrition:  TPN Dypshagia 3 12/27 > (still poor PO intake) Glucerna shake TID (10 g of protein  and 220 kcal each) *Refusing so far except for once yesterday  Plan:   At 1800:  - continue TPN at reduced rate of 36mL/hr to slightly reduce protein, dextrose, and lipid components which may help with transaminitis. Still meeting bottom of range for estimated needs with just parenteral nutrition. - Electrolytes in TPN:  Na 50 mEq/L  K 35 mEq/L Ca 5 mEq/L  Reduce Mg to 2.5 mEq/L  Increase Phos to 7.5 mmol/L  Cl:Ac 1:2 - Add standard MVI and trace elements to TPN - continue sensitive SSI Q6h - Standard TPN labs on Mon/Thurs - CMET, phos and mag on 1/1  Maximizing enteral nutrition may also help transaminitis. Recommend continued encouragement of enteral nutrition.  Ulice Dash D  03/20/2021 9:48 AM

## 2021-03-21 DIAGNOSIS — K269 Duodenal ulcer, unspecified as acute or chronic, without hemorrhage or perforation: Secondary | ICD-10-CM | POA: Diagnosis not present

## 2021-03-21 LAB — CBC
HCT: 23.2 % — ABNORMAL LOW (ref 36.0–46.0)
Hemoglobin: 7.4 g/dL — ABNORMAL LOW (ref 12.0–15.0)
MCH: 32.2 pg (ref 26.0–34.0)
MCHC: 31.9 g/dL (ref 30.0–36.0)
MCV: 100.9 fL — ABNORMAL HIGH (ref 80.0–100.0)
Platelets: 229 10*3/uL (ref 150–400)
RBC: 2.3 MIL/uL — ABNORMAL LOW (ref 3.87–5.11)
RDW: 13.9 % (ref 11.5–15.5)
WBC: 8.7 10*3/uL (ref 4.0–10.5)
nRBC: 0 % (ref 0.0–0.2)

## 2021-03-21 LAB — TYPE AND SCREEN
ABO/RH(D): A POS
Antibody Screen: NEGATIVE

## 2021-03-21 LAB — COMPREHENSIVE METABOLIC PANEL
ALT: 114 U/L — ABNORMAL HIGH (ref 0–44)
AST: 39 U/L (ref 15–41)
Albumin: 3 g/dL — ABNORMAL LOW (ref 3.5–5.0)
Alkaline Phosphatase: 74 U/L (ref 38–126)
Anion gap: 6 (ref 5–15)
BUN: 63 mg/dL — ABNORMAL HIGH (ref 8–23)
CO2: 24 mmol/L (ref 22–32)
Calcium: 8.6 mg/dL — ABNORMAL LOW (ref 8.9–10.3)
Chloride: 112 mmol/L — ABNORMAL HIGH (ref 98–111)
Creatinine, Ser: 1.32 mg/dL — ABNORMAL HIGH (ref 0.44–1.00)
GFR, Estimated: 39 mL/min — ABNORMAL LOW (ref >60)
Glucose, Bld: 146 mg/dL — ABNORMAL HIGH (ref 70–99)
Potassium: 4.2 mmol/L (ref 3.5–5.1)
Sodium: 142 mmol/L (ref 135–145)
Total Bilirubin: 0.5 mg/dL (ref 0.3–1.2)
Total Protein: 5.2 g/dL — ABNORMAL LOW (ref 6.5–8.1)

## 2021-03-21 LAB — GLUCOSE, CAPILLARY
Glucose-Capillary: 135 mg/dL — ABNORMAL HIGH (ref 70–99)
Glucose-Capillary: 142 mg/dL — ABNORMAL HIGH (ref 70–99)
Glucose-Capillary: 108 mg/dL — ABNORMAL HIGH (ref 70–99)
Glucose-Capillary: 132 mg/dL — ABNORMAL HIGH (ref 70–99)

## 2021-03-21 LAB — PHOSPHORUS: Phosphorus: 3.2 mg/dL (ref 2.5–4.6)

## 2021-03-21 LAB — MAGNESIUM: Magnesium: 2.1 mg/dL (ref 1.7–2.4)

## 2021-03-21 MED ORDER — HYDRALAZINE HCL 25 MG PO TABS
25.0000 mg | ORAL_TABLET | Freq: Three times a day (TID) | ORAL | Status: DC
  Administered 2021-03-21 – 2021-03-22 (×4): 25 mg via ORAL
  Filled 2021-03-21 (×4): qty 1

## 2021-03-21 MED ORDER — TRAVASOL 10 % IV SOLN
INTRAVENOUS | Status: AC
  Filled 2021-03-21: qty 763.2

## 2021-03-21 NOTE — Progress Notes (Signed)
PHARMACY - TOTAL PARENTERAL NUTRITION CONSULT NOTE   Indication:  Intolerance to enteral feeding  Patient Measurements: Height: 5\' 3"  (160 cm) Weight: 51 kg (112 lb 7 oz) IBW/kg (Calculated) : 52.4 TPN AdjBW (KG): 48.1 Body mass index is 19.92 kg/m.  Assessment: 88 yo female with PMH hypertension, carotid stenosis, chronic renal insuffiencey & anemia admitted on 12/16 with abdominal pain.  CT revealed perforated viscus and was taken to the OR for gastric repair of perforated duodenal ulcer with washout and placement of intra-abdominal drain.  Pharmacy is consulted to start TPN on 12/20.  Glucose / Insulin: No hx DM. - on sensitive SSI q6h (used 4 units in 24 hrs) - cbgs (goal <150): 121-146 Electrolytes: - CorrCa 9.4; other lytes wnl except Cl 112 Renal: Hx CKD, SCr down 1.32(below baseline 1.9), BUN elevated at 63 Hepatic: AST/ALT improving 39/114; Tbili wnl, Alb 3. Prealbumin 8.5 on 12/19.  Triglycerides: WNL (43 on 12/26) Intake / Output: - 527 mL - UOP 1750 mL , Drain output: none reported MIVF: none GI Imaging: - 12/16 CT abd Moderate free air with mild-to-moderate free fluid in the abdomen. Findings consistent with perforated hollow viscus.  - 12/28 abd CT: No significant free abdominal or free pelvic fluid collections and no free air. Distended gallbladder with a 2 cm gallstone. Bilateral pleural effusions with overlying atelectasis GI Surgeries / Procedures:  - 12/17 exploratory laparotomy and graham patch repair for perforated duodenal ulcer  Central access: PICC placed 12/20 TPN start date: 12/20  Nutritional Goals: Goal TPN rate is 60 mL/hr (provides 76 g of protein and 1498 kcals per day)  RD Assessment: pending Estimated Needs Total Energy Estimated Needs: 1500-1700 kcal Total Protein Estimated Needs: 75-90 grams Total Fluid Estimated Needs: >/= 1.7 L/day  Current Nutrition:  TPN Dypshagia 3 12/27 > (still poor PO intake) Glucerna shake TID (10 g of protein  and 220 kcal each) *Refusing intermittently  Plan:   At 1800:  - continue TPN at reduced rate of 58mL/hr to slightly reduce protein, dextrose, and lipid components which may help with transaminitis. Still meeting bottom of range for estimated needs with just parenteral nutrition. - Electrolytes in TPN:  Na 50 mEq/L  K 35 mEq/L Ca 5 mEq/L  Mg 2.5 mEq/L  Phos 7.5 mmol/L  Cl:Ac max Ac - Add standard MVI and trace elements to TPN - continue sensitive SSI Q6h - Standard TPN labs on Mon/Thurs  Maximizing enteral nutrition may also help transaminitis. Recommend continued encouragement of enteral nutrition.  Ulice Dash D  03/21/2021 9:54 AM

## 2021-03-21 NOTE — Progress Notes (Signed)
Consultation Progress Note   Patient: Kathleen Jimenez WOE:[id number] DOB: Apr 10, 1931 DOA: 03/05/2021 DOS: the patient was seen and examined on 03/21/2021 Primary service: Ccs, Md, MD  Brief hospital course: Medical consultation for inpatient management of CKD stage IV, HTN, and dyslipidemia  88 yo female who presented with 1 day of abdominal pain, work up in the ED showed perforated viscus and she was taken urgently to the operating room.   12/16 exploratory laparotomy underwent washout and intra-abdominal drain. Placed on broad spectrum antibiotic therapy and transferred to the step down unit.  Patient developed agitation and delirium, and required precedex infusion.  12/20 NG tube removed  12/22 transferred to the med surg unit 12/27 received IV Lasix and steroids.  Assessment and Plan: * Perforated abdominal viscus- (present on admission) Management per primary team.  Essential hypertension- (present on admission) Home blood pressure regimen includes hydralazine 25 twice daily, Coreg 6.25 twice daily, Norvasc 5 mg daily. Blood pressure mildly elevated. Will continue Norvasc 5 mg daily and add hydralazine.  Duodenal ulcer disease- (present on admission) Management per primary team surgery.  Postoperative anemia due to acute blood loss- (present on admission) On top of chronic anemia from chronic blood loss as well as CKD. Has baseline hemoglobin of around 9. On presentation hemoglobin of 9.6.  Postoperatively dropped down to 6.8.  Required 1 PRBC transfusion. Currently dropping 7.3 likely from frequent blood draws. Monitor CBC biweekly. Transfuse for hemoglobin less than 7.  Acute renal failure superimposed on stage 4 chronic kidney disease ([place])- (present on admission) Baseline serum creatinine around 1.7.   On presentation serum creatinine 2.   Initially worsened, peaked at 2.49.  Currently improving. BUN still elevated.  Now trending down.  Monitor.  Malnutrition of moderate  degree- (present on admission) From poor p.o. intake postoperatively. Dietitian following.  Continue supplements.  Patient requires feeding assistance. Family deciding to cut the TPN in half to see if it will help improve appetite. If not patient will benefit from being on Marinol. Body mass index is 20.03 kg/m.  Nutrition Problem: Moderate Malnutrition Etiology: chronic illness Nutrition Interventions: Interventions: TPN    Transaminitis- (present on admission) Likely in the setting of TPN. Bilirubin normal. Continue to monitor for now. AST and ALT in 200 range.  Now trending down.  2 near normal level.  Hyperlipidemia- (present on admission) On Crestor at home.  Currently on hold.  Acute respiratory failure ([place])- (present on admission) 87% on room air on 12/16. Currently oxygenation improved. Now on room air.  Carotid stenosis- (present on admission) Prior history.  On aspirin at home.  Currently on hold.  Also holding statin.      TRH will continue to follow the patient.  Subjective: No nausea no vomiting no fever no chills.  Appears to have some shortness of breath although denies any shortness of breath.  No cough.  Minimal oral intake.  No appetite.  No hand pain.  Objective: Vital signs were reviewed and unremarkable. General: Appear in mild distress, no Rash; Oral Mucosa Clear, moist. no Abnormal Neck Mass Or lumps, Conjunctiva normal  Cardiovascular: S1 and S2 Present, no Murmur, Respiratory: good respiratory effort, Bilateral Air entry present and CTA, no Crackles, no wheezes Abdomen: Bowel Sound present, Soft and no tenderness Extremities: no Pedal edema Neurology: alert and oriented to time, place, and person affect appropriate. no new focal deficit Gait not checked due to patient safety concerns   Data Reviewed: My review of labs, imaging, notes and other  tests shows no new significant findings.   Family Communication: Son at bedside.   Updated.     Time spent: 35 minutes.  Author: Berle Mull 03/21/2021 5:47 PM  For on call review [email or web address].

## 2021-03-21 NOTE — Progress Notes (Signed)
16 Days Post-Op   Subjective/Chief Complaint: No complaints today. Says her hand does not hurt. Nurses say she hasn't eaten much   Objective: Vital signs in last 24 hours: Temp:  [98.3 F (36.8 C)-98.7 F (37.1 C)] 98.3 F (36.8 C) (01/01 0616) Pulse Rate:  [60-79] 79 (01/01 0616) Resp:  [14] 14 (12/31 2226) BP: (134-164)/(49-61) 164/61 (01/01 0616) SpO2:  [95 %-100 %] 100 % (01/01 0616) Weight:  [51 kg] 51 kg (01/01 0500) Last BM Date: 03/19/21  Intake/Output from previous day: 12/31 0701 - 01/01 0700 In: 1223.3 [P.O.:180; I.V.:1043.3] Out: 1750 [Urine:1750] Intake/Output this shift: No intake/output data recorded.  General appearance: alert and cooperative Resp: clear to auscultation bilaterally Cardio: regular rate and rhythm GI: soft, nontender. Wound clean  Lab Results:  Recent Labs    03/20/21 0407 03/21/21 0539  WBC 9.0 8.7  HGB 7.3* 7.4*  HCT 23.2* 23.2*  PLT 222 229   BMET Recent Labs    03/20/21 0407 03/21/21 0539  NA 140 142  K 4.1 4.2  CL 109 112*  CO2 25 24  GLUCOSE 140* 146*  BUN 70* 63*  CREATININE 1.40* 1.32*  CALCIUM 8.5* 8.6*   PT/INR No results for input(s): LABPROT, INR in the last 72 hours. ABG No results for input(s): PHART, HCO3 in the last 72 hours.  Invalid input(s): PCO2, PO2  Studies/Results: DG Wrist Complete Right  Result Date: 03/20/2021 CLINICAL DATA:  Right hand pain. EXAM: RIGHT WRIST - COMPLETE 3+ VIEW COMPARISON:  None. FINDINGS: No fracture or bone lesion. Widened scapholunate interval to 4 mm. Mild narrowing of the radial scaphoid joint space. More significant narrowing with subchondral sclerosis and osteophytes at the trapezium first metacarpal articulation. Remaining joints normally spaced and aligned. Skeletal structures are demineralized. IMPRESSION: 1. No fracture or acute finding. 2. 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 3. Arthropathic changes consistent with  osteoarthritis predominantly involving trapezium first metacarpal articulation milder involvement the radial scaphoid joint. Electronically Signed   By: Lajean Manes M.D.   On: 03/20/2021 15:40    Anti-infectives: Anti-infectives (From admission, onward)    Start     Dose/Rate Route Frequency Ordered Stop   03/06/21 0400  piperacillin-tazobactam (ZOSYN) IVPB 2.25 g        2.25 g 100 mL/hr over 30 Minutes Intravenous Every 6 hours 03/06/21 0216 03/12/21 1707   03/05/21 2115  piperacillin-tazobactam (ZOSYN) IVPB 3.375 g        3.375 g 100 mL/hr over 30 Minutes Intravenous  Once 03/05/21 2101 03/05/21 2203       Assessment/Plan: s/p Procedure(s): EXPLORATORY LAPAROTOMY and graham patch repair of perforated duodenal ulcer (N/A) Advance diet. Encourage po's PT/OT Perforated duodenal ulcer S/P exploratory laparotomy with Phillip Heal patch repair 03/05/21 Dr. Marlou Starks - POD 15 - UGI without leak  - soft diet as tolerated - continue to mobilize with PT/OT - off IV abx - PPI  BID IV - H pylori is negative    FEN: soft diet, TPN VTE: LMWH ID: off abx; WBC normal   HTN: cont home meds, appreciate TRH assistance HLD CKD stage IV - Cr trending down today, baseline reportedly between 1.9 and 2.5 Carotid stenosis  Chronic anemia - hgb 8.2, continue to monitor  Moderate protein calorie malnutrition - TPN as listed above; encourage PO intake Physical deconditioning  Urinary retention- Foley replaced 12/25    Place foley for better decompression - add urecholine - continue Foley until 12/29 for ongoing diuresis  LOS: 16 days    Autumn Messing III 03/21/2021

## 2021-03-22 DIAGNOSIS — R198 Other specified symptoms and signs involving the digestive system and abdomen: Secondary | ICD-10-CM | POA: Diagnosis not present

## 2021-03-22 LAB — COMPREHENSIVE METABOLIC PANEL
ALT: 87 U/L — ABNORMAL HIGH (ref 0–44)
AST: 32 U/L (ref 15–41)
Albumin: 2.9 g/dL — ABNORMAL LOW (ref 3.5–5.0)
Alkaline Phosphatase: 78 U/L (ref 38–126)
Anion gap: 5 (ref 5–15)
BUN: 59 mg/dL — ABNORMAL HIGH (ref 8–23)
CO2: 25 mmol/L (ref 22–32)
Calcium: 8.5 mg/dL — ABNORMAL LOW (ref 8.9–10.3)
Chloride: 110 mmol/L (ref 98–111)
Creatinine, Ser: 1.34 mg/dL — ABNORMAL HIGH (ref 0.44–1.00)
GFR, Estimated: 38 mL/min — ABNORMAL LOW (ref >60)
Glucose, Bld: 118 mg/dL — ABNORMAL HIGH (ref 70–99)
Potassium: 4.2 mmol/L (ref 3.5–5.1)
Sodium: 140 mmol/L (ref 135–145)
Total Bilirubin: 0.5 mg/dL (ref 0.3–1.2)
Total Protein: 5.2 g/dL — ABNORMAL LOW (ref 6.5–8.1)

## 2021-03-22 LAB — CBC WITH DIFFERENTIAL/PLATELET
Abs Immature Granulocytes: 0.03 10*3/uL (ref 0.00–0.07)
Basophils Absolute: 0 10*3/uL (ref 0.0–0.1)
Basophils Relative: 1 %
Eosinophils Absolute: 0.7 10*3/uL — ABNORMAL HIGH (ref 0.0–0.5)
Eosinophils Relative: 8 %
HCT: 23.3 % — ABNORMAL LOW (ref 36.0–46.0)
Hemoglobin: 7.3 g/dL — ABNORMAL LOW (ref 12.0–15.0)
Immature Granulocytes: 0 %
Lymphocytes Relative: 11 %
Lymphs Abs: 0.9 10*3/uL (ref 0.7–4.0)
MCH: 31.3 pg (ref 26.0–34.0)
MCHC: 31.3 g/dL (ref 30.0–36.0)
MCV: 100 fL (ref 80.0–100.0)
Monocytes Absolute: 0.7 10*3/uL (ref 0.1–1.0)
Monocytes Relative: 8 %
Neutro Abs: 5.9 10*3/uL (ref 1.7–7.7)
Neutrophils Relative %: 72 %
Platelets: 218 10*3/uL (ref 150–400)
RBC: 2.33 MIL/uL — ABNORMAL LOW (ref 3.87–5.11)
RDW: 14 % (ref 11.5–15.5)
WBC: 8.2 10*3/uL (ref 4.0–10.5)
nRBC: 0 % (ref 0.0–0.2)

## 2021-03-22 LAB — MAGNESIUM: Magnesium: 2 mg/dL (ref 1.7–2.4)

## 2021-03-22 LAB — GLUCOSE, CAPILLARY
Glucose-Capillary: 133 mg/dL — ABNORMAL HIGH (ref 70–99)
Glucose-Capillary: 133 mg/dL — ABNORMAL HIGH (ref 70–99)
Glucose-Capillary: 122 mg/dL — ABNORMAL HIGH (ref 70–99)

## 2021-03-22 LAB — TRIGLYCERIDES: Triglycerides: 36 mg/dL (ref <150)

## 2021-03-22 LAB — PHOSPHORUS: Phosphorus: 3.5 mg/dL (ref 2.5–4.6)

## 2021-03-22 MED ORDER — TRAVASOL 10 % IV SOLN
INTRAVENOUS | Status: AC
  Filled 2021-03-22: qty 381.6

## 2021-03-22 MED ORDER — HYDRALAZINE HCL 50 MG PO TABS
50.0000 mg | ORAL_TABLET | Freq: Three times a day (TID) | ORAL | Status: DC
  Administered 2021-03-22 – 2021-03-30 (×21): 50 mg via ORAL
  Filled 2021-03-22 (×21): qty 1

## 2021-03-22 MED ORDER — HYDRALAZINE HCL 25 MG PO TABS
25.0000 mg | ORAL_TABLET | Freq: Once | ORAL | Status: AC
  Administered 2021-03-22: 25 mg via ORAL
  Filled 2021-03-22: qty 1

## 2021-03-22 MED ORDER — TRAVASOL 10 % IV SOLN
INTRAVENOUS | Status: DC
  Filled 2021-03-22: qty 763.2

## 2021-03-22 NOTE — Progress Notes (Signed)
Nutrition Follow-up  DOCUMENTATION CODES:   Non-severe (moderate) malnutrition in context of chronic illness  INTERVENTION:   -TPN management per Pharmacy  -Glucerna Shake po TID, each supplement provides 220 kcal and 10 grams of protein   NUTRITION DIAGNOSIS:   Moderate Malnutrition related to chronic illness as evidenced by moderate fat depletion, severe muscle depletion, mild muscle depletion.  Ongoing.  GOAL:   Patient will meet greater than or equal to 90% of their needs  Progressing.  MONITOR:   Diet advancement, Labs, Weight trends, Skin, Other (Comment) (TPN regimen)  ASSESSMENT:   88 year-old female with medical history of HTN, anemia, CKD, and L carotid stenosis. She presented to the ED due to abdominal pain which began the day PTA and reported sour taste in her mouth for several weeks. In the ED she had a CT which was consistent with free air and bowel perforation.  12/17: s/p ex lap with graham patch repair d/t perforated duodenal ulcer 12/20: PICC placed, TPN initiated 12/21: bari-CLD 12/23: FLD 12/27: Dysphagia 3 diet  Pt now on solid diet. Consumed no breakfast this morning d/t pain. Ate some jello then 25% of her lunch tray. Accepted 2 Glucernas today.   TPN to be decreased to 30 ml/hr today, providing 748 kcals and 38g protein.   Admission weight: 106 lbs. Current weight: 113 lbs  I/Os: -6.4L since 12/19 UOP: 1100 ml x 24 hrs  Medications: Vitamin D, Fibercon   Labs reviewed:  CBGs: 108-142 TG: 36  Diet Order:   Diet Order             DIET DYS 3 Room service appropriate? Yes; Fluid consistency: Thin  Diet effective now                   EDUCATION NEEDS:   Not appropriate for education at this time  Skin:  Skin Assessment: Skin Integrity Issues: Skin Integrity Issues:: Stage I, Incisions Stage I: sacrum Incisions: abdomen (12/17)  Last BM:  1/2 -type 7  Height:   Ht Readings from Last 1 Encounters:  03/06/21 5\' 3"  (1.6  m)    Weight:   Wt Readings from Last 1 Encounters:  03/22/21 51.7 kg    Ideal Body Weight:  52.3 kg  BMI:  Body mass index is 20.19 kg/m.  Estimated Nutritional Needs:   Kcal:  1500-1700 kcal  Protein:  75-90 grams  Fluid:  >/= 1.7 L/day   Clayton Bibles, MS, RD, LDN Inpatient Clinical Dietitian Contact information available via Amion

## 2021-03-22 NOTE — Progress Notes (Signed)
[facility] PROGRESS NOTE  Patient: Kathleen Jimenez YEL:[id number]   PCP: Ginger Organ., MD DOB: 01/16/1932   DOA: 03/05/2021   DOS: 03/22/2021    Subjective: No acute complaint.  No pain.  No nausea no vomiting.  No events overnight.  Tells me that she is hungry.  Objective:  Vitals:   03/22/21 0502 03/22/21 1430  BP: (!) 170/67 (!) 171/91  Pulse: 82 85  Resp: 16 16  Temp: 97.9 F (36.6 C) 99 F (37.2 C)  SpO2: 97% 99%    S1-S2 present. Mild bilateral expiratory wheeze present. Bowel sound present. No edema in the lower extremity.  Assessment and plan: Essential hypertension. Blood pressure elevated.  We will increase the dose of the hydralazine from 25 mg 3 times daily to 50 mg 3 times daily. Continue Norvasc 5 mg twice daily. Likely will require a 20 mg IV Lasix on 03/23/2021.  Low-grade temp but Patient had a temp of 99. Will monitor for now.  CKD. Serum creatinine stable.  BUN improving.  Monitor.  Anemia. Hemoglobin stable around 7.3-7.4-7.3. No evidence of active bleeding reported as well. Monitor biweekly.  Author: Berle Mull, MD [facility] 03/22/2021 7:14 PM   If 7PM-7AM, please contact night-coverage at [email or web address]

## 2021-03-22 NOTE — Progress Notes (Addendum)
PHARMACY - TOTAL PARENTERAL NUTRITION CONSULT NOTE   Indication:  Intolerance to enteral feeding  Patient Measurements: Height: 5\' 3"  (160 cm) Weight: 51.7 kg (113 lb 15.7 oz) IBW/kg (Calculated) : 52.4 TPN AdjBW (KG): 48.1 Body mass index is 20.19 kg/m.  Assessment: 88 yo female with PMH hypertension, carotid stenosis, chronic renal insuffiencey & anemia admitted on 12/16 with abdominal pain.  CT revealed perforated viscus and was taken to the OR for gastric repair of perforated duodenal ulcer with washout and placement of intra-abdominal drain.  Pharmacy is consulted to start TPN on 12/20.  Glucose / Insulin: No hx DM. - on sensitive SSI q6h (used 2 units in 24 hrs) - cbgs (goal <150): 108-142 Electrolytes: - CorrCa 9.4; other lytes wnl   Renal: Hx CKD, SCr stable, today is  1.34 (below baseline 1.9), BUN elevated at 59 Hepatic: AST/ALT improving 32/87; Tbili wnl, Alb 2.9. Prealbumin 8.5 on 12/19.  Triglycerides: WNL (43 on 12/26) Intake / Output: - 527 mL - UOP 2050 mL , Drain output: none reported MIVF: none GI Imaging: - 12/16 CT abd Moderate free air with mild-to-moderate free fluid in the abdomen. Findings consistent with perforated hollow viscus.  - 12/28 abd CT: No significant free abdominal or free pelvic fluid collections and no free air. Distended gallbladder with a 2 cm gallstone. Bilateral pleural effusions with overlying atelectasis GI Surgeries / Procedures:  - 12/17 exploratory laparotomy and graham patch repair for perforated duodenal ulcer  Central access: PICC placed 12/20 TPN start date: 12/20  Nutritional Goals: Goal TPN rate is 60 mL/hr (provides 76 g of protein and 1498 kcals per day)  RD Assessment: pending Estimated Needs Total Energy Estimated Needs: 1500-1700 kcal Total Protein Estimated Needs: 75-90 grams Total Fluid Estimated Needs: >/= 1.7 L/day  Current Nutrition:  TPN Dypshagia 3 12/27 > (still poor PO intake) Glucerna shake TID (10 g of  protein and 220 kcal each) *Refusing intermittently  Plan:   At 1800:  - continue TPN at reduced rate of 74mL/hr to slightly reduce protein, dextrose, and lipid components which may help with transaminitis. Still meeting bottom of range for estimated needs with just parenteral nutrition. - Electrolytes in TPN:  Na 50 mEq/L  K 35 mEq/L Ca 5 mEq/L  Mg 4 mEq/L  Phos 7.5 mmol/L  Cl:Ac max Ac - Add standard MVI and trace elements to TPN - continue sensitive SSI Q6h - Standard TPN labs on Mon/Thurs - BMP, magnesium, phosphorus with AM labs    Maximizing enteral nutrition may also help transaminitis. Recommend continued encouragement of enteral nutrition.    [name], PharmD, BCPS 03/22/2021 9:34 AM   ADDENDUM - Received message from Dr. Zenia Resides to decrease the TPN rate to 1/2 of current rate in anticipation of stopping. Orders updated with a new lower rate.   [name], PharmD, BCPS 03/22/2021 10:52 AM

## 2021-03-22 NOTE — Progress Notes (Signed)
17 Days Post-Op   Subjective/Chief Complaint: No complaints today. Requesting to get up to chair this morning. PO intake remains minimal.   Objective: Vital signs in last 24 hours: Temp:  [97.7 F (36.5 C)-98.4 F (36.9 C)] 97.9 F (36.6 C) (01/02 0502) Pulse Rate:  [70-82] 82 (01/02 0502) Resp:  [16-18] 16 (01/02 0502) BP: (143-170)/(53-67) 170/67 (01/02 0502) SpO2:  [97 %-98 %] 97 % (01/02 0502) Weight:  [51.7 kg] 51.7 kg (01/02 0500) Last BM Date: 03/19/21  Intake/Output from previous day: 01/01 0701 - 01/02 0700 In: 674 [P.O.:130; I.V.:544] Out: 2050 [Urine:2050] Intake/Output this shift: Total I/O In: 227 [I.V.:227] Out: 400 [Urine:400]  General: resting comfortably, NAD Neuro: alert and pleasant Resp: normal work of breathing on room air Abdomen: soft, nondistended, nontender to palpation. Midline incision clean. Extremities: warm and well-perfused   Lab Results:  Recent Labs    03/21/21 0539 03/22/21 0634  WBC 8.7 8.2  HGB 7.4* 7.3*  HCT 23.2* 23.3*  PLT 229 218   BMET Recent Labs    03/21/21 0539 03/22/21 0634  NA 142 140  K 4.2 4.2  CL 112* 110  CO2 24 25  GLUCOSE 146* 118*  BUN 63* 59*  CREATININE 1.32* 1.34*  CALCIUM 8.6* 8.5*   PT/INR No results for input(s): LABPROT, INR in the last 72 hours. ABG No results for input(s): PHART, HCO3 in the last 72 hours.  Invalid input(s): PCO2, PO2  Studies/Results: No results found.  Anti-infectives: Anti-infectives (From admission, onward)    Start     Dose/Rate Route Frequency Ordered Stop   03/06/21 0400  piperacillin-tazobactam (ZOSYN) IVPB 2.25 g        2.25 g 100 mL/hr over 30 Minutes Intravenous Every 6 hours 03/06/21 0216 03/12/21 1707   03/05/21 2115  piperacillin-tazobactam (ZOSYN) IVPB 3.375 g        3.375 g 100 mL/hr over 30 Minutes Intravenous  Once 03/05/21 2101 03/05/21 2203       Assessment/Plan: s/p Procedure(s): EXPLORATORY LAPAROTOMY and graham patch repair of  perforated duodenal ulcer (N/A) Advance diet. Encourage po's PT/OT Perforated duodenal ulcer S/P exploratory laparotomy with Phillip Heal patch repair 03/05/21 Dr. Marlou Starks - UGI without leak  - soft diet as tolerated - continue to mobilize with PT/OT - off IV abx - BID PPI (oral) - H pylori is negative    FEN: soft diet, wean TPN to 1/2 rate today to see if appetite improves. VTE: LMWH ID: off abx; WBC normal   HTN: cont home meds, appreciate TRH assistance HLD CKD stage IV - Cr stable at 1.3, baseline reportedly between 1.9 and 2.5 Carotid stenosis  Chronic anemia - hgb stable  Moderate protein calorie malnutrition - Wean TPN, encourage PO intake Physical deconditioning  Urinary retention- Foley replaced 12/25, on urecholine, will remove foley today and allow voiding trial.  Discussed plan of care with patient's family.      LOS: 17 days    Dwan Bolt 03/22/2021

## 2021-03-23 DIAGNOSIS — M25531 Pain in right wrist: Secondary | ICD-10-CM | POA: Diagnosis not present

## 2021-03-23 DIAGNOSIS — K269 Duodenal ulcer, unspecified as acute or chronic, without hemorrhage or perforation: Secondary | ICD-10-CM | POA: Diagnosis not present

## 2021-03-23 DIAGNOSIS — R451 Restlessness and agitation: Secondary | ICD-10-CM | POA: Diagnosis not present

## 2021-03-23 DIAGNOSIS — R338 Other retention of urine: Secondary | ICD-10-CM | POA: Diagnosis not present

## 2021-03-23 LAB — BASIC METABOLIC PANEL
Anion gap: 4 — ABNORMAL LOW (ref 5–15)
BUN: 59 mg/dL — ABNORMAL HIGH (ref 8–23)
CO2: 26 mmol/L (ref 22–32)
Calcium: 8.5 mg/dL — ABNORMAL LOW (ref 8.9–10.3)
Chloride: 112 mmol/L — ABNORMAL HIGH (ref 98–111)
Creatinine, Ser: 1.41 mg/dL — ABNORMAL HIGH (ref 0.44–1.00)
GFR, Estimated: 36 mL/min — ABNORMAL LOW (ref >60)
Glucose, Bld: 124 mg/dL — ABNORMAL HIGH (ref 70–99)
Potassium: 3.9 mmol/L (ref 3.5–5.1)
Sodium: 142 mmol/L (ref 135–145)

## 2021-03-23 LAB — GLUCOSE, CAPILLARY
Glucose-Capillary: 106 mg/dL — ABNORMAL HIGH (ref 70–99)
Glucose-Capillary: 117 mg/dL — ABNORMAL HIGH (ref 70–99)
Glucose-Capillary: 117 mg/dL — ABNORMAL HIGH (ref 70–99)
Glucose-Capillary: 124 mg/dL — ABNORMAL HIGH (ref 70–99)
Glucose-Capillary: 84 mg/dL (ref 70–99)
Glucose-Capillary: 91 mg/dL (ref 70–99)

## 2021-03-23 LAB — MAGNESIUM: Magnesium: 2 mg/dL (ref 1.7–2.4)

## 2021-03-23 LAB — PHOSPHORUS: Phosphorus: 3.5 mg/dL (ref 2.5–4.6)

## 2021-03-23 NOTE — Assessment & Plan Note (Signed)
Recurrent issue. Patient had final cc of urine in her bladder on 1/3.  Unable to urinate.  Foley catheter inserted for now. Recommend voiding trial again in next 48 hours.

## 2021-03-23 NOTE — Assessment & Plan Note (Signed)
Patient was on telemetry for as needed agitation. Family would not like to use this medication. If agitation becomes a long-term issue, patient will benefit from Zyprexa which may have side effect of appetite stimulation as well.

## 2021-03-23 NOTE — Progress Notes (Signed)
Physical Therapy Treatment Patient Details Name: Kathleen Jimenez MRN: [id number] DOB: [birth year] Today's Date: 03/23/2021   History of Present Illness 88 yo female  s/p exploratory laparotomy for perforated duodenal ulcer on 12/16. PMH : IM nail, lumbar fusion, CKD    PT Comments    Pt is progressing slowly toward goals. Participates with encouragement however requiring more assist overall this session. Continue to recommend SNF post acute.   Recommendations for follow up therapy are one component of a multi-disciplinary discharge planning process, led by the attending physician.  Recommendations may be updated based on patient status, additional functional criteria and insurance authorization.  Follow Up Recommendations  Skilled nursing-short term rehab (<3 hours/day)     Assistance Recommended at Discharge Frequent or constant Supervision/Assistance  Patient can return home with the following     Equipment Recommendations  None recommended by PT    Recommendations for Other Services       Precautions / Restrictions Precautions Precautions: Fall Precaution Comments: incontinent of urine Restrictions Weight Bearing Restrictions: No     Mobility  Bed Mobility Overal bed mobility: Needs Assistance Bed Mobility: Rolling;Sidelying to Sit Rolling: Min assist;Mod assist Sidelying to sit: Mod assist;Min assist;+2 for physical assistance;+2 for safety/equipment       General bed mobility comments: assist to guide LEs off bed, elevate trunk. requires lots of encouragement. multi-modal cues for self assist    Transfers Overall transfer level: Needs assistance Equipment used: Rolling walker (2 wheels) Transfers: Sit to/from Stand Sit to Stand: Mod assist;+2 physical assistance;+2 safety/equipment Stand pivot transfers: Mod assist;+2 physical assistance;+2 safety/equipment;Max assist         General transfer comment: assist to rise and pivot to recliner; 2 person assist, did  use device    Ambulation/Gait                   Stairs             Wheelchair Mobility    Modified Rankin (Stroke Patients Only)       Balance   Sitting-balance support: Feet supported;Bilateral upper extremity supported;No upper extremity supported Sitting balance-Leahy Scale: Poor Sitting balance - Comments: assist to maintain midline, cues to self assist. after sitting ~ 3 minutes pt able to briefly hold self EOB     Standing balance-Leahy Scale: Zero                              Cognition Arousal/Alertness: Awake/alert Behavior During Therapy: Flat affect                                   General Comments: awakens/alert but sleepy, had Haldol last lnight. repeatedly states "I can't"        Exercises General Exercises - Lower Extremity Ankle Circles/Pumps: AAROM;Both;10 reps;Supine Heel Slides: AAROM;5 reps;Both    General Comments        Pertinent Vitals/Pain Pain Assessment: Faces Faces Pain Scale: Hurts little more Pain Location: abd Pain Descriptors / Indicators: Grimacing Pain Intervention(s): Monitored during session;Limited activity within patient's tolerance;Repositioned    Home Living                          Prior Function            PT Goals (current goals can now be found in the care plan  section) Acute Rehab PT Goals Patient Stated Goal: to feel better PT Goal Formulation: With patient Time For Goal Achievement: 04/05/21 Potential to Achieve Goals: Fair Progress towards PT goals: Progressing toward goals (slowly)    Frequency    Min 2X/week      PT Plan Current plan remains appropriate    Co-evaluation              AM-PAC PT "6 Clicks" Mobility   Outcome Measure  Help needed turning from your back to your side while in a flat bed without using bedrails?: A Little Help needed moving from lying on your back to sitting on the side of a flat bed without using  bedrails?: Total Help needed moving to and from a bed to a chair (including a wheelchair)?: Total Help needed standing up from a chair using your arms (e.g., wheelchair or bedside chair)?: Total Help needed to walk in hospital room?: Total Help needed climbing 3-5 steps with a railing? : Total 6 Click Score: 8    End of Session Equipment Utilized During Treatment: Gait belt Activity Tolerance: Patient limited by fatigue Patient left: in chair;with call bell/phone within reach;with chair alarm set Nurse Communication: Mobility status PT Visit Diagnosis: Other abnormalities of gait and mobility (R26.89);Muscle weakness (generalized) (M62.81);Difficulty in walking, not elsewhere classified (R26.2)     Time: 6568-1275 PT Time Calculation (min) (ACUTE ONLY): 14 min  Charges:  $Therapeutic Activity: 8-22 mins                     Baxter Flattery, PT  Acute Rehab Dept [facility]) [phone] Pager [phone]  03/23/2021    [facility] 03/23/2021, 11:30 AM

## 2021-03-23 NOTE — Progress Notes (Signed)
Patient is awake, very anxious, agitated and confused. Pt is asking for help on her rt arm. She requested don't leave alone in her room. Her linen and gown changed. Repositioned, and try to redirected her behavior. PRN inj. haldol 0.5mg  IM administered. Assisted her to use BSC. Will continue to monitor.

## 2021-03-23 NOTE — Progress Notes (Signed)
Consultation Progress Note   Patient: Kathleen Jimenez PJA:[id number] DOB: 1931/10/25 DOA: 03/05/2021 DOS: the patient was seen and examined on 03/23/2021 Primary service: Ccs, Md, MD  Brief hospital course: Medical consultation for inpatient management of CKD stage IV, HTN, and dyslipidemia  88 yo female who presented with 1 day of abdominal pain, work up in the ED showed perforated viscus and she was taken urgently to the operating room.   12/16 exploratory laparotomy underwent washout and intra-abdominal drain. Placed on broad spectrum antibiotic therapy and transferred to the step down unit.  Patient developed agitation and delirium, and required precedex infusion.  12/20 NG tube removed  12/22 transferred to the med surg unit 12/27 received IV Lasix and steroids.  Assessment and Plan: * Perforated abdominal viscus- (present on admission) Management per primary team.  Essential hypertension- (present on admission) Home blood pressure regimen includes hydralazine 25 twice daily, Coreg 6.25 twice daily, Norvasc 5 mg daily. Blood pressure mildly elevated. Will continue Norvasc 5 mg daily and add hydralazine.  Hydralazine dose increased.  Duodenal ulcer disease- (present on admission) Management per primary team surgery.  Postoperative anemia due to acute blood loss- (present on admission) On top of chronic anemia from chronic blood loss as well as CKD. Has baseline hemoglobin of around 9. On presentation hemoglobin of 9.6.  Postoperatively dropped down to 6.8.  Required 1 PRBC transfusion. Currently dropping likely from frequent blood draws. Monitor CBC biweekly. Transfuse for hemoglobin less than 7.  Acute renal failure superimposed on stage 4 chronic kidney disease ([place])- (present on admission) Baseline serum creatinine around 1.7.   On presentation serum creatinine 2.   Initially worsened, peaked at 2.49.  Currently stable at around 1.4. BUN still elevated.  Now trending down.   Monitor.  Acute urinary retention Recurrent issue. Patient had final cc of urine in her bladder on 1/3.  Unable to urinate.  Foley catheter inserted for now. Recommend voiding trial again in next 48 hours.  Malnutrition of moderate degree- (present on admission) From poor p.o. intake postoperatively. Dietitian following.  Continue supplements.  Patient requires feeding assistance. TPN currently stopped as of 1/3. If no improvement in appetite, patient will benefit from being on Marinol. Body mass index is 20.03 kg/m.  Nutrition Problem: Moderate Malnutrition Etiology: chronic illness Nutrition Interventions: Interventions: TPN    Wrist pain, acute, right X-ray negative for any acute abnormality. No further work-up.  No further reports of wrist pain.  Transaminitis- (present on admission) Likely in the setting of TPN. Bilirubin normal. Continue to monitor for now. AST and ALT in 200 range.  Now trending down.  2 near normal level.  Agitation Patient was on telemetry for as needed agitation. Family would not like to use this medication. If agitation becomes a long-term issue, patient will benefit from Zyprexa which may have side effect of appetite stimulation as well.  Hyperlipidemia- (present on admission) On Crestor at home.  Currently on hold.  Acute respiratory failure ([place])- (present on admission) 87% on room air on 12/16. Currently oxygenation improved. Now on room air.  Carotid stenosis- (present on admission) Prior history.  On aspirin at home.  Currently on hold.  Also holding statin.      TRH will continue to follow the patient.  Subjective: No nausea no vomiting.  No fever no chills.  Was agitated last night requiring IM Haldol injection.  Objective: Remains hypertensive. General: Appear in mild distress, no Rash; Oral Mucosa Clear, moist. no Abnormal Neck Mass Or  lumps, Conjunctiva normal  Cardiovascular: S1 and S2 Present, no Murmur, Respiratory:  good respiratory effort, Bilateral Air entry present and CTA, no Crackles, no wheezes Abdomen: Bowel Sound present, Soft and no tenderness Extremities: no Pedal edema Neurology: alert and oriented to time, place, and person affect appropriate. no new focal deficit Gait not checked due to patient safety concerns   Data Reviewed: My review of labs, imaging, notes and other tests shows no new significant findings.   Family Communication: Son Dr. Harlow Asa at bedside.     Time spent: 35 minutes.  Author: Berle Mull 03/23/2021 8:18 PM  For on call review [email or web address].

## 2021-03-23 NOTE — Assessment & Plan Note (Signed)
X-ray negative for any acute abnormality. No further work-up.  No further reports of wrist pain.

## 2021-03-23 NOTE — Progress Notes (Signed)
18 Days Post-Op   Subjective/Chief Complaint: Had mild agitation overnight and given one dose of IM haldol. Afebrile, vitals stable. Resting comfortably this morning. Voiding spontaneously after foley removal.   Objective: Vital signs in last 24 hours: Temp:  [97.9 F (36.6 C)-99 F (37.2 C)] 97.9 F (36.6 C) (01/03 0546) Pulse Rate:  [60-85] 60 (01/03 0546) Resp:  [16-18] 18 (01/03 0546) BP: (150-171)/(40-91) 156/40 (01/03 0546) SpO2:  [97 %-99 %] 97 % (01/03 0546) Weight:  [49.7 kg] 49.7 kg (01/03 0500) Last BM Date: 03/19/21  Intake/Output from previous day: 01/02 0701 - 01/03 0700 In: 1355 [P.O.:340; I.V.:1015] Out: 400 [Urine:400] Intake/Output this shift: No intake/output data recorded.  General: resting comfortably, NAD Neuro: alert and pleasant Resp: normal work of breathing on room air Abdomen: soft, nondistended, nontender to palpation. Midline incision is clean and granulating, with fibrinous exudate at the base of the wound. Extremities: warm and well-perfused   Lab Results:  Recent Labs    03/21/21 0539 03/22/21 0634  WBC 8.7 8.2  HGB 7.4* 7.3*  HCT 23.2* 23.3*  PLT 229 218   BMET Recent Labs    03/22/21 0634 03/23/21 0309  NA 140 142  K 4.2 3.9  CL 110 112*  CO2 25 26  GLUCOSE 118* 124*  BUN 59* 59*  CREATININE 1.34* 1.41*  CALCIUM 8.5* 8.5*   PT/INR No results for input(s): LABPROT, INR in the last 72 hours. ABG No results for input(s): PHART, HCO3 in the last 72 hours.  Invalid input(s): PCO2, PO2  Studies/Results: No results found.  Anti-infectives: Anti-infectives (From admission, onward)    Start     Dose/Rate Route Frequency Ordered Stop   03/06/21 0400  piperacillin-tazobactam (ZOSYN) IVPB 2.25 g        2.25 g 100 mL/hr over 30 Minutes Intravenous Every 6 hours 03/06/21 0216 03/12/21 1707   03/05/21 2115  piperacillin-tazobactam (ZOSYN) IVPB 3.375 g        3.375 g 100 mL/hr over 30 Minutes Intravenous  Once 03/05/21 2101  03/05/21 2203       Assessment/Plan: s/p Procedure(s): EXPLORATORY LAPAROTOMY and graham patch repair of perforated duodenal ulcer (N/A) Advance diet. Encourage po's PT/OT Perforated duodenal ulcer S/P exploratory laparotomy with Phillip Heal patch repair 03/05/21 Dr. Marlou Starks - UGI without leak  - soft diet as tolerated - continue to mobilize with PT/OT - off IV abx - BID PPI (oral) - H pylori is negative    FEN: soft diet as tolerated. Discontinue TPN tonight and continue to monitor PO intake. VTE: LMWH ID: off abx; WBC normal   HTN: On oral norvasc and hydralazine, appreciate TRH assistance HLD CKD stage IV - Cr stable, baseline reportedly between 1.9 and 2.5 Carotid stenosis  Chronic anemia - hgb stable  Moderate protein calorie malnutrition - Stop TPN, encourage PO intake Physical deconditioning  Urinary retention- Resolved, foley removed 1/2, voiding spontaneously.  Dispo: Will likely need SNF placement at discharge, discussions with family ongoing.      LOS: 18 days    Dwan Bolt 03/23/2021

## 2021-03-24 DIAGNOSIS — R198 Other specified symptoms and signs involving the digestive system and abdomen: Secondary | ICD-10-CM | POA: Diagnosis not present

## 2021-03-24 LAB — GLUCOSE, CAPILLARY
Glucose-Capillary: 84 mg/dL (ref 70–99)
Glucose-Capillary: 87 mg/dL (ref 70–99)
Glucose-Capillary: 92 mg/dL (ref 70–99)

## 2021-03-24 LAB — COMPREHENSIVE METABOLIC PANEL
ALT: 62 U/L — ABNORMAL HIGH (ref 0–44)
AST: 30 U/L (ref 15–41)
Albumin: 3 g/dL — ABNORMAL LOW (ref 3.5–5.0)
Alkaline Phosphatase: 80 U/L (ref 38–126)
Anion gap: 7 (ref 5–15)
BUN: 51 mg/dL — ABNORMAL HIGH (ref 8–23)
CO2: 23 mmol/L (ref 22–32)
Calcium: 8.4 mg/dL — ABNORMAL LOW (ref 8.9–10.3)
Chloride: 114 mmol/L — ABNORMAL HIGH (ref 98–111)
Creatinine, Ser: 1.3 mg/dL — ABNORMAL HIGH (ref 0.44–1.00)
GFR, Estimated: 40 mL/min — ABNORMAL LOW (ref >60)
Glucose, Bld: 88 mg/dL (ref 70–99)
Potassium: 4 mmol/L (ref 3.5–5.1)
Sodium: 144 mmol/L (ref 135–145)
Total Bilirubin: 0.7 mg/dL (ref 0.3–1.2)
Total Protein: 5.4 g/dL — ABNORMAL LOW (ref 6.5–8.1)

## 2021-03-24 NOTE — Progress Notes (Signed)
Mid abdominal dressing changed per MD order. Pt needs  assist and encouragement to eat &drink. Sit patient up for all meals r/t possible aspiration. Currently tolerating a DYS #3 diet.

## 2021-03-24 NOTE — Progress Notes (Signed)
Occupational Therapy Treatment Patient Details Name: Kathleen Jimenez MRN: [id number] DOB: October 10, 1931 Today's Date: 03/24/2021   History of present illness 88 yo female  s/p exploratory laparotomy for perforated duodenal ulcer on 12/16. PMH : IM nail, lumbar fusion, CKD   OT comments  Pt assisted to sit EOB with maximum encouragement. Refused participation in any ADL or to attempt standing. Remains appropriate for SNF level rehab.    Recommendations for follow up therapy are one component of a multi-disciplinary discharge planning process, led by the attending physician.  Recommendations may be updated based on patient status, additional functional criteria and insurance authorization.    Follow Up Recommendations  Skilled nursing-short term rehab (<3 hours/day)    Assistance Recommended at Discharge Frequent or constant Supervision/Assistance  Patient can return home with the following  A lot of help with bathing/dressing/bathroom;Two people to help with walking and/or transfers   Equipment Recommendations  Other (comment) (defer to next venue)    Recommendations for Other Services      Precautions / Restrictions Precautions Precautions: Fall Precaution Comments: incontinent of urine       Mobility Bed Mobility Overal bed mobility: Needs Assistance Bed Mobility: Rolling;Sidelying to Sit;Sit to Sidelying Rolling: Mod assist Sidelying to sit: Mod assist     Sit to sidelying: Mod assist General bed mobility comments: assist to guide LEs off bed, elevate trunk. requires lots of encouragement. multi-modal cues for self assist    Transfers                   General transfer comment: actively resistant to attempts to stand     Balance Overall balance assessment: Needs assistance Sitting-balance support: Feet supported;Bilateral upper extremity supported;No upper extremity supported Sitting balance-Leahy Scale: Poor Sitting balance - Comments: min assist for balance,  sat x 8 minutes at EOB                                   ADL either performed or assessed with clinical judgement   ADL                                         General ADL Comments: Pt declined participation in ADL.    Extremity/Trunk Assessment              Vision       Perception     Praxis      Cognition Arousal/Alertness: Awake/alert Behavior During Therapy: Flat affect Overall Cognitive Status: No family/caregiver present to determine baseline cognitive functioning                                 General Comments: pt repeating, "No, I just want you to hold my hand."          Exercises     Shoulder Instructions       General Comments      Pertinent Vitals/ Pain       Pain Assessment: Faces Faces Pain Scale: Hurts a little bit Pain Location: abdomen Pain Descriptors / Indicators: Grimacing Pain Intervention(s): Repositioned;Monitored during session  Home Living  Prior Functioning/Environment              Frequency  Min 2X/week        Progress Toward Goals  OT Goals(current goals can now be found in the care plan section)  Progress towards OT goals: Not progressing toward goals - comment  Acute Rehab OT Goals OT Goal Formulation: Patient unable to participate in goal setting Potential to Achieve Goals: [place] Discharge plan remains appropriate    Co-evaluation                 AM-PAC OT "6 Clicks" Daily Activity     Outcome Measure   Help from another person eating meals?: A Lot Help from another person taking care of personal grooming?: Total Help from another person toileting, which includes using toliet, bedpan, or urinal?: Total Help from another person bathing (including washing, rinsing, drying)?: Total Help from another person to put on and taking off regular upper body clothing?: Total Help from another  person to put on and taking off regular lower body clothing?: Total 6 Click Score: 7    End of Session    OT Visit Diagnosis: Unsteadiness on feet (R26.81);Other symptoms and signs involving cognitive function;History of falling (Z91.81);Muscle weakness (generalized) (M62.81);Pain   Activity Tolerance Patient limited by fatigue   Patient Left in bed;with call bell/phone within reach;with bed alarm set;with nursing/sitter in room   Nurse Communication          Time: 1204-1219 OT Time Calculation (min): 15 min  Charges: OT General Charges $OT Visit: 1 Visit OT Treatments $Therapeutic Activity: 8-22 mins  Nestor Lewandowsky, OTR/L Acute Rehabilitation Services Pager: [phone] Office: [phone]   Malka So 03/24/2021, 12:20 PM

## 2021-03-24 NOTE — Plan of Care (Signed)

## 2021-03-24 NOTE — Progress Notes (Addendum)
19 Days Post-Op   Subjective/Chief Complaint: Foley replaced yesterday due to retention. Oriented to person, place, and time this AM. Denies abdominal pain. C/o R wrist pain. Denies BM (per epic, last BM 1-2 d ago). Does not want breakfast - states she does not like the food. Does like the protein shakes.   Objective: Vital signs in last 24 hours: Temp:  [97.5 F (36.4 C)-99.1 F (37.3 C)] 97.9 F (36.6 C) (01/04 0528) Pulse Rate:  [42-80] 80 (01/04 0528) Resp:  [14-17] 16 (01/04 0528) BP: (132-156)/(43-69) 156/69 (01/04 0528) SpO2:  [95 %-100 %] 95 % (01/04 0528) Last BM Date: 03/19/21  Intake/Output from previous day: 01/03 0701 - 01/04 0700 In: 569.8 [P.O.:240; I.V.:329.8] Out: 1700 [Urine:1700] Intake/Output this shift: Total I/O In: 0  Out: 200 [Urine:200]  General: resting comfortably, NAD Neuro: alert and pleasant Resp: normal work of breathing on room air Abdomen: soft, nondistended, nontender to palpation. Midline dressing c/d/I. Extremities: warm and well-perfused   Lab Results:  Recent Labs    03/22/21 0634  WBC 8.2  HGB 7.3*  HCT 23.3*  PLT 218   BMET Recent Labs    03/23/21 0309 03/24/21 0334  NA 142 144  K 3.9 4.0  CL 112* 114*  CO2 26 23  GLUCOSE 124* 88  BUN 59* 51*  CREATININE 1.41* 1.30*  CALCIUM 8.5* 8.4*   PT/INR No results for input(s): LABPROT, INR in the last 72 hours. ABG No results for input(s): PHART, HCO3 in the last 72 hours.  Invalid input(s): PCO2, PO2  Studies/Results: No results found.  Anti-infectives: Anti-infectives (From admission, onward)    Start     Dose/Rate Route Frequency Ordered Stop   03/06/21 0400  piperacillin-tazobactam (ZOSYN) IVPB 2.25 g        2.25 g 100 mL/hr over 30 Minutes Intravenous Every 6 hours 03/06/21 0216 03/12/21 1707   03/05/21 2115  piperacillin-tazobactam (ZOSYN) IVPB 3.375 g        3.375 g 100 mL/hr over 30 Minutes Intravenous  Once 03/05/21 2101 03/05/21 2203        Assessment/Plan: s/p Procedure(s): EXPLORATORY LAPAROTOMY and graham patch repair of perforated duodenal ulcer (N/A) Advance diet. Encourage po's PT/OT Perforated duodenal ulcer S/P exploratory laparotomy with Phillip Heal patch repair 03/05/21 Dr. Marlou Starks - UGI without leak  - soft diet as tolerated, glucerna TID - continue to mobilize with PT/OT - off IV abx - BID PPI (oral) - H pylori is negative    FEN: soft diet as tolerated. glucerna TID VTE: LMWH Foley: replaced 1/3 for retention, urecholine  ID: off abx; WBC normal   HTN: On oral norvasc and hydralazine, appreciate TRH assistance HLD CKD stage IV - Cr stable, baseline reportedly between 1.9 and 2.5 Carotid stenosis  Chronic anemia - hgb stable  Moderate protein calorie malnutrition - encourage PO intake Physical deconditioning  Urinary retention- Resolved, foley removed 1/2, voiding spontaneously.  Dispo: continue foley today. encourage PO intake and closely monitor.  PT/OT. Will likely need SNF placement at discharge, discussions with family ongoing.       LOS: 19 days    Jill Alexanders 03/24/2021

## 2021-03-24 NOTE — Progress Notes (Addendum)
Consultation PROGRESS NOTE    Kathleen Jimenez  EPP:[id number] DOB: 10/20/1931 DOA: 03/05/2021 PCP: Ginger Organ., MD    Brief Narrative:  Kathleen Jimenez is an 88 year old female with past medical history significant for essential hypertension, hyperlipidemia, CKD stage IV who presented to [facility] ED on 12/16 with 1 day history of abdominal pain.  Evaluation in the ED notable for perforated viscus and was taken urgently to the operating room.  Hospitalist service consulted for further evaluation and management of CKD, hypertension and dyslipidemia.   Assessment & Plan:   Principal Problem:   Perforated abdominal viscus Active Problems:   Essential hypertension   Postoperative anemia due to acute blood loss   Carotid stenosis   Duodenal ulcer disease   Acute respiratory failure (HCC)   Malnutrition of moderate degree   Transaminitis   Hyperlipidemia   Acute renal failure superimposed on stage 4 chronic kidney disease (HCC)   Acute urinary retention   Wrist pain, acute, right   Agitation   Perforated abdominal viscus Patient initially presented to the ED with 1 day history of abdominal pain.  CT chest/abdomen/pelvis with moderate free air and mild-moderate free fluid in the abdomen consistent with perforated hollow viscus.  Patient underwent exploratory laparotomy with Phillip Heal patch repair of perforated duodenal ulcer by general surgery, Dr. Marlou Starks on 03/05/2021.  Upper GI series on 12/21 with mild irregularity proximal duodenum without visible leak. --TPN discontinued 1/3 --Diet advanced to dysphagia 3 diet --Further per primary, general surgery  Right thyroid mass On CT chest/abdomen/pelvis, incidental finding of 5.2 cm right thyroid heterogeneous mass with patchy calcifications and displacement of the trachea to the left.  Per family, has been present for roughly 62 years and no further work-up desired at this time.  Transaminitis improving. --AST 46>>>231>>>30 --ALT  50>>264>>>62 --Tbili in normal limits --Holding home Crestor; TPN now discontinued --CMP in a.m.  Acute urinary retention Foley catheter placed for elevated retained urine in bladder on 1/3. --Continue Foley catheter for now --Bethanechol 5 mg p.o. 3 times daily --Encourage increased mobilization  Essential hypertension Home regimen includes hydralazine 25mg  PO twice daily, carvedilol 6.25 mg PO twice daily, Amlodipine 5 mg p.o. daily. --Hydralazine increased to 25 mg PO TID --Amlodipine 5 mg p.o. daily --Carvedilol 6.25mg  PO BID --Continue monitor BP and adjust as needed  Postoperative anemia secondary to acute blood loss Anemia of chronic medical disease Baseline hemoglobin 9, on presentation hemoglobin 9.6's and postoperatively dropped down to 6.8; status post 1 unit PRBCs. --Continue monitor CBC twice weekly --Transfuse for hemoglobin less than 7.0  Hyperlipidemia --Holding home Crestor due to elevated LFTs  Acute renal failure on CKD stage IV Baseline creatinine 1.7.  Creatinine initially trended up to a peak of 2.49. --Creatinine 1.30 today, improved better than baseline --Avoid nephrotoxins, renal dose all medications  Acute hypoxic respiratory failure, POA: Resolved On presentation to the ED, patient was noted to be 87% on room air.  Likely secondary to perforated viscus as above.  Has been titrated off of supplemental oxygen. --Continue SPO2 checks with vital signs  Agitation: Resolved Would avoid benzodiazepines/Haldol.  Hx carotid artery stenosis --Currently holding home aspirin/statin  Moderate protein calorie malnutrition Body mass index is 19.37 kg/m. Nutrition Status: Nutrition Problem: Moderate Malnutrition Etiology: chronic illness Signs/Symptoms: moderate fat depletion, severe muscle depletion, mild muscle depletion Interventions: TPN -- TPN discontinued 1/3, diet advance --Dietitian following, continue supplementation, patient requires feeding  assistance, continue to encourage increased oral intake --Consider Marinol if no  improvement in oral intake in the next few days --Family okay with IR PEG tube placement if necessary  Weakness/deconditioning/debility: --PT/OT currently recommending SNF placement --Continue therapy efforts while inpatient   DVT prophylaxis: enoxaparin (LOVENOX) injection 30 mg Start: 03/07/21 0800 SCD's Start: 03/06/21 0147   Code Status: Full Code Family Communication: No family present at bedside this morning; updated patient's son, Dr. Harlow Asa this afternoon.  Disposition Plan:  Level of care: Med-Surg Status is: Inpatient   Procedures:  Exploratory laparotomy with Phillip Heal patch, Dr. Marlou Starks 12/16  Antimicrobials:  Zosyn 12/16 - 12/23    Subjective: Patient seen examined at bedside, resting comfortably.  Slightly confused.  No family present.  TPN has been discontinued and general surgery advancing diet.  No other complaints or concerns at this time.  Denies headache, no chest pain, no shortness of breath, no abdominal pain.  No acute events overnight reported by nursing staff.  Objective: Vitals:   03/23/21 2158 03/24/21 0122 03/24/21 0528 03/24/21 1438  BP: (!) 148/58 (!) 133/44 (!) 156/69 (!) 154/73  Pulse: 75 68 80 76  Resp:  17 16 18   Temp: (!) 97.5 F (36.4 C) 98.9 F (37.2 C) 97.9 F (36.6 C) 98.9 F (37.2 C)  TempSrc:  Oral Oral Oral  SpO2: 100% 100% 95% 96%  Weight:      Height:        Intake/Output Summary (Last 24 hours) at 03/24/2021 1456 Last data filed at 03/24/2021 1000 Gross per 24 hour  Intake 329.9 ml  Output 1300 ml  Net -970.1 ml   Filed Weights   03/22/21 0500 03/23/21 0500 03/23/21 0918  Weight: 51.7 kg 49.7 kg 49.6 kg    Examination:  General exam: Appears calm and comfortable, slightly confused, frail Respiratory system: Clear to auscultation. Respiratory effort normal.  On room air Cardiovascular system: S1 & S2 heard, RRR. No JVD, murmurs, rubs,  gallops or clicks. No pedal edema. Gastrointestinal system: Abdomen is nondistended, soft and nontender. No organomegaly or masses felt. Normal bowel sounds heard.  Abdominal dressing noted in place, clean/dry/intact. GU: Foley catheter noted with clear yellow urine in collection bag Central nervous system: Alert and oriented. No focal neurological deficits. Extremities: Symmetric 5 x 5 power. Skin: No rashes, lesions or ulcers Psychiatry: Judgement and insight appear normal. Mood & affect appropriate.     Data Reviewed: I have personally reviewed following labs and imaging studies  CBC: Recent Labs  Lab 03/20/21 0407 03/21/21 0539 03/22/21 0634  WBC 9.0 8.7 8.2  NEUTROABS  --   --  5.9  HGB 7.3* 7.4* 7.3*  HCT 23.2* 23.2* 23.3*  MCV 99.1 100.9* 100.0  PLT 222 229 592   Basic Metabolic Panel: Recent Labs  Lab 03/19/21 0413 03/20/21 0407 03/21/21 0539 03/22/21 0634 03/23/21 0309 03/24/21 0334  NA 139 140 142 140 142 144  K 4.4 4.1 4.2 4.2 3.9 4.0  CL 108 109 112* 110 112* 114*  CO2 25 25 24 25 26 23   GLUCOSE 132* 140* 146* 118* 124* 88  BUN 76* 70* 63* 59* 59* 51*  CREATININE 1.55* 1.40* 1.32* 1.34* 1.41* 1.30*  CALCIUM 8.2* 8.5* 8.6* 8.5* 8.5* 8.4*  MG 2.4 2.2 2.1 2.0 2.0  --   PHOS 3.4 2.9 3.2 3.5 3.5  --    GFR: Estimated Creatinine Clearance: 23.9 mL/min (A) (by C-G formula based on SCr of 1.3 mg/dL (H)). Liver Function Tests: Recent Labs  Lab 03/19/21 0413 03/20/21 0407 03/21/21 0539 03/22/21 9244 03/24/21  0334  AST 140* 65* 39 32 30  ALT 235* 159* 114* 87* 62*  ALKPHOS 69 72 74 78 80  BILITOT 0.4 0.6 0.5 0.5 0.7  PROT 4.9* 4.9* 5.2* 5.2* 5.4*  ALBUMIN 2.9* 2.9* 3.0* 2.9* 3.0*   Recent Labs  Lab 03/18/21 1518  AMYLASE 113*   No results for input(s): AMMONIA in the last 168 hours. Coagulation Profile: No results for input(s): INR, PROTIME in the last 168 hours. Cardiac Enzymes: No results for input(s): CKTOTAL, CKMB, CKMBINDEX, TROPONINI in  the last 168 hours. BNP (last 3 results) No results for input(s): PROBNP in the last 8760 hours. HbA1C: No results for input(s): HGBA1C in the last 72 hours. CBG: Recent Labs  Lab 03/23/21 1723 03/23/21 2211 03/23/21 2354 03/24/21 0627 03/24/21 1201  GLUCAP 106* 91 84 87 84   Lipid Profile: Recent Labs    03/22/21 0634  TRIG 36   Thyroid Function Tests: No results for input(s): TSH, T4TOTAL, FREET4, T3FREE, THYROIDAB in the last 72 hours. Anemia Panel: No results for input(s): VITAMINB12, FOLATE, FERRITIN, TIBC, IRON, RETICCTPCT in the last 72 hours. Sepsis Labs: No results for input(s): PROCALCITON, LATICACIDVEN in the last 168 hours.  No results found for this or any previous visit (from the past 240 hour(s)).       Radiology Studies: No results found.      Scheduled Meds:  acetaminophen  1,000 mg Oral TID   amLODipine  5 mg Oral Daily   bethanechol  5 mg Oral TID   chlorhexidine  15 mL Mouth Rinse BID   Chlorhexidine Gluconate Cloth  6 each Topical Daily   cholecalciferol  2,000 Units Oral Daily   enoxaparin (LOVENOX) injection  30 mg Subcutaneous Q24H   feeding supplement (GLUCERNA SHAKE)  237 mL Oral TID BM   hydrALAZINE  50 mg Oral Q8H   insulin aspart  0-9 Units Subcutaneous Q6H   mouth rinse  15 mL Mouth Rinse q12n4p   melatonin  3 mg Oral QHS   pantoprazole  40 mg Oral BID   polycarbophil  625 mg Oral BID   rosuvastatin  10 mg Oral Daily   sodium chloride flush  10-40 mL Intracatheter Q12H   Continuous Infusions:   LOS: 18 days    Time spent: 39 minutes spent on chart review, discussion with nursing staff, consultants, updating family and interview/physical exam; more than 50% of that time was spent in counseling and/or coordination of care.    Demya Scruggs J [place] DO [facility] Available via Epic secure chat 7am-7pm After these hours, please refer to coverage provider listed on [email or web address] 03/24/2021, 2:56 PM

## 2021-03-25 DIAGNOSIS — R198 Other specified symptoms and signs involving the digestive system and abdomen: Secondary | ICD-10-CM | POA: Diagnosis not present

## 2021-03-25 LAB — GLUCOSE, CAPILLARY
Glucose-Capillary: 83 mg/dL (ref 70–99)
Glucose-Capillary: 91 mg/dL (ref 70–99)
Glucose-Capillary: 98 mg/dL (ref 70–99)

## 2021-03-25 LAB — BASIC METABOLIC PANEL
Anion gap: 6 (ref 5–15)
BUN: 43 mg/dL — ABNORMAL HIGH (ref 8–23)
CO2: 23 mmol/L (ref 22–32)
Calcium: 8.2 mg/dL — ABNORMAL LOW (ref 8.9–10.3)
Chloride: 114 mmol/L — ABNORMAL HIGH (ref 98–111)
Creatinine, Ser: 1.51 mg/dL — ABNORMAL HIGH (ref 0.44–1.00)
GFR, Estimated: 33 mL/min — ABNORMAL LOW (ref >60)
Glucose, Bld: 79 mg/dL (ref 70–99)
Potassium: 3.7 mmol/L (ref 3.5–5.1)
Sodium: 143 mmol/L (ref 135–145)

## 2021-03-25 MED ORDER — AMLODIPINE BESYLATE 5 MG PO TABS
5.0000 mg | ORAL_TABLET | Freq: Every day | ORAL | Status: DC
  Administered 2021-03-25 – 2021-03-30 (×6): 5 mg via ORAL
  Filled 2021-03-25 (×6): qty 1

## 2021-03-25 MED ORDER — CARVEDILOL 6.25 MG PO TABS
6.2500 mg | ORAL_TABLET | Freq: Two times a day (BID) | ORAL | Status: DC
  Administered 2021-03-25 – 2021-03-27 (×5): 6.25 mg via ORAL
  Filled 2021-03-25 (×6): qty 1

## 2021-03-25 MED ORDER — AMLODIPINE BESYLATE 10 MG PO TABS: 10.0000 mg | ORAL_TABLET | Freq: Every day | ORAL | Status: DC

## 2021-03-25 NOTE — Progress Notes (Addendum)
20 Days Post-Op   Subjective/Chief Complaint: Alert and less confused this morning. Dr. Armandina Gemma at bedside - states she told him she had to go to the bathroom. She got up out of bed to the bedside commode with assistance and had a large, nonbloody stool. She is now up in the chair. Had some coke and banana this morning. She denies pain, nausea, or vomiting. Foley remains in place.   Patients son, Randall Hiss, is coming in to town today and staying through the weekend   Objective: Vital signs in last 24 hours: Temp:  [97.3 F (36.3 C)-98.9 F (37.2 C)] 97.3 F (36.3 C) (01/05 0600) Pulse Rate:  [68-90] 90 (01/05 0600) Resp:  [16-18] 18 (01/05 0600) BP: (146-179)/(73-80) 179/80 (01/05 0600) SpO2:  [94 %-96 %] 94 % (01/05 0600) Weight:  [46.2 kg] 46.2 kg (01/05 0600) Last BM Date: 03/19/21  Intake/Output from previous day: 01/04 0701 - 01/05 0700 In: 120 [P.O.:120] Out: 2050 [Urine:2050] Intake/Output this shift: No intake/output data recorded.  General: resting comfortably, NAD Neuro: alert and pleasant Resp: normal work of breathing on room air Abdomen: soft, nondistended, nontender to palpation. Midline wound with pink granulation tissue. There is some fibrinous exudate centrally at the wound base. No bleeding or purulence.  Extremities: warm and well-perfused  BMET Recent Labs    03/24/21 0334 03/25/21 0343  NA 144 143  K 4.0 3.7  CL 114* 114*  CO2 23 23  GLUCOSE 88 79  BUN 51* 43*  CREATININE 1.30* 1.51*  CALCIUM 8.4* 8.2*   PT/INR No results for input(s): LABPROT, INR in the last 72 hours. ABG No results for input(s): PHART, HCO3 in the last 72 hours.  Invalid input(s): PCO2, PO2  Studies/Results: No results found.  Anti-infectives: Anti-infectives (From admission, onward)    Start     Dose/Rate Route Frequency Ordered Stop   03/06/21 0400  piperacillin-tazobactam (ZOSYN) IVPB 2.25 g        2.25 g 100 mL/hr over 30 Minutes Intravenous Every 6 hours  03/06/21 0216 03/12/21 1707   03/05/21 2115  piperacillin-tazobactam (ZOSYN) IVPB 3.375 g        3.375 g 100 mL/hr over 30 Minutes Intravenous  Once 03/05/21 2101 03/05/21 2203       Assessment/Plan: s/p Procedure(s): EXPLORATORY LAPAROTOMY and graham patch repair of perforated duodenal ulcer (N/A) Advance diet. Encourage po's PT/OT Perforated duodenal ulcer S/P exploratory laparotomy with Phillip Heal patch repair 03/05/21 Dr. Marlou Starks - UGI without leak  - soft diet as tolerated, glucerna TID; not eating much - continue to mobilize with PT/OT - off IV abx - BID PPI (oral) - H pylori is negative    FEN: soft diet as tolerated. glucerna TID VTE: LMWH Foley: replaced 1/3 for retention, urecholine; may repeat TOV tomorrow 1/6 - will confirm with MD. ID: off abx; WBC normal   HTN: On oral norvasc and hydralazine, appreciate TRH assistance HLD CKD stage IV - Cr stable, baseline reportedly between 1.9 and 2.5 Carotid stenosis  Chronic anemia - hgb stable  Moderate protein calorie malnutrition - encourage PO intake Physical deconditioning  Urinary retention- Resolved, foley removed 1/2, voiding spontaneously.  Dispo: continue foley. encourage PO intake.  PT/OT. Will likely need SNF placement at discharge, discussions with family ongoing.       LOS: 19 days    Jill Alexanders 03/25/2021

## 2021-03-25 NOTE — Progress Notes (Signed)
Consultation PROGRESS NOTE    Kathleen Jimenez  VVO:[id number] DOB: August 26, 1931 DOA: 03/05/2021 PCP: Ginger Organ., MD    Brief Narrative:  Kathleen Jimenez is an 88 year old female with past medical history significant for essential hypertension, hyperlipidemia, CKD stage IV who presented to [facility] ED on 12/16 with 1 day history of abdominal pain.  Evaluation in the ED notable for perforated viscus and was taken urgently to the operating room.  Hospitalist service consulted for further evaluation and management of CKD, hypertension and dyslipidemia.   Assessment & Plan:   Principal Problem:   Perforated abdominal viscus Active Problems:   Essential hypertension   Postoperative anemia due to acute blood loss   Carotid stenosis   Duodenal ulcer disease   Acute respiratory failure (HCC)   Malnutrition of moderate degree   Transaminitis   Hyperlipidemia   Acute renal failure superimposed on stage 4 chronic kidney disease (HCC)   Acute urinary retention   Wrist pain, acute, right   Agitation   Perforated abdominal viscus Duodenal ulcer. Patient initially presented to the ED with 1 day history of abdominal pain.  CT chest/abdomen/pelvis with moderate free air and mild-moderate free fluid in the abdomen consistent with perforated hollow viscus.  Patient underwent exploratory laparotomy with Phillip Heal patch repair of perforated duodenal ulcer by general surgery, Dr. Marlou Starks on 03/05/2021.  Upper GI series on 12/21 with mild irregularity proximal duodenum without visible leak.  H. pylori negative. --TPN discontinued 1/3 --Diet advanced to dysphagia 3 diet --Further per primary, general surgery  Right thyroid mass On CT chest/abdomen/pelvis, incidental finding of 5.2 cm right thyroid heterogeneous mass with patchy calcifications and displacement of the trachea to the left.  Per family, has been present for roughly 13 years and no further work-up desired at this time.  Transaminitis  improving. --AST 46>>>231>>>30 --ALT 50>>264>>>62 --Tbili in normal limits --Holding home Crestor; TPN now discontinued  Acute urinary retention Foley catheter placed for elevated retained urine in bladder on 1/3. --Continue Foley catheter for now --Bethanechol 5 mg p.o. 3 times daily --Encourage increased mobilization  Essential hypertension Home regimen includes hydralazine 25mg  PO twice daily, carvedilol 6.25 mg PO twice daily, Amlodipine 5 mg p.o. daily. --Hydralazine increased to 50 mg PO TID --Amlodipine 5 mg p.o. daily --Carvedilol 6.25mg  PO BID --Continue monitor BP and adjust as needed  Postoperative anemia secondary to acute blood loss Anemia of chronic medical disease Baseline hemoglobin 9, on presentation hemoglobin 9.6's and postoperatively dropped down to 6.8; status post 1 unit pRBCs. --Continue monitor CBC twice weekly --Transfuse for hemoglobin less than 7.0  Hyperlipidemia --Holding home Crestor due to elevated LFTs  Acute renal failure on CKD stage IV Baseline creatinine 1.7.  Creatinine initially trended up to a peak of 2.49. --Creatinine 1.51 today --Avoid nephrotoxins, renal dose all medications  Acute hypoxic respiratory failure, POA: Resolved On presentation to the ED, patient was noted to be 87% on room air.  Likely secondary to perforated viscus as above.  Has been titrated off of supplemental oxygen. --Continue SPO2 checks with vital signs  Agitation: Resolved Would avoid benzodiazepines/Haldol.  Hx carotid artery stenosis --Currently holding home aspirin/statin  Moderate protein calorie malnutrition Body mass index is 18.04 kg/m. Nutrition Status: Nutrition Problem: Moderate Malnutrition Etiology: chronic illness Signs/Symptoms: moderate fat depletion, severe muscle depletion, mild muscle depletion Interventions: TPN --TPN discontinued 1/3, diet advance --Dietitian following, continue supplementation, patient requires feeding assistance,  continue to encourage increased oral intake --Consider Marinol if no improvement in  oral intake in the next few days --Family okay with IR PEG tube placement if necessary  Weakness/deconditioning/debility: --PT/OT currently recommending SNF placement --Continue therapy efforts while inpatient   DVT prophylaxis: enoxaparin (LOVENOX) injection 30 mg Start: 03/07/21 0800 SCD's Start: 03/06/21 0147   Code Status: Full Code Family Communication: updated patient's son, Dr. Harlow Asa this morning at bedside.  Disposition Plan:  Level of care: Med-Surg Status is: Inpatient   Procedures:  Exploratory laparotomy with Phillip Heal patch, Dr. Marlou Starks 12/16  Antimicrobials:  Zosyn 12/16 - 12/23    Subjective: Patient seen examined at bedside, resting comfortably.  Sitting in bedside chair.  Son, Dr. Harlow Asa present.  Patient ate three quarters of a banana and three quarters of soda this morning.  Much less confused today.  Patient talking about her sisters and other son who will be coming into town today.  No other complaints or concerns at this time.  Denies headache, no chest pain, no shortness of breath, no abdominal pain.  No acute events overnight reported by nursing staff.  Objective: Vitals:   03/24/21 1438 03/24/21 2137 03/25/21 0153 03/25/21 0600  BP: (!) 154/73 (!) 146/77 (!) 151/76 (!) 179/80  Pulse: 76 84 68 90  Resp: 18 16 16 18   Temp: 98.9 F (37.2 C) 97.9 F (36.6 C) 98 F (36.7 C) (!) 97.3 F (36.3 C)  TempSrc: Oral Oral Oral Oral  SpO2: 96% 96% 96% 94%  Weight:    46.2 kg  Height:        Intake/Output Summary (Last 24 hours) at 03/25/2021 1146 Last data filed at 03/25/2021 0900 Gross per 24 hour  Intake 240 ml  Output 1850 ml  Net -1610 ml   Filed Weights   03/23/21 0500 03/23/21 0918 03/25/21 0600  Weight: 49.7 kg 49.6 kg 46.2 kg    Examination:  General exam: Appears calm and comfortable, frail/elderly in appearance Respiratory system: Clear to auscultation.  Respiratory effort normal.  On room air Cardiovascular system: S1 & S2 heard, RRR. No JVD, murmurs, rubs, gallops or clicks. No pedal edema. Gastrointestinal system: Abdomen is nondistended, soft and nontender. No organomegaly or masses felt. Normal bowel sounds heard.  Abdominal dressing noted in place, clean/dry/intact. GU: Foley catheter noted with clear yellow urine in collection bag Central nervous system: Alert and oriented. No focal neurological deficits. Extremities: Symmetric 5 x 5 power. Skin: No rashes, lesions or ulcers Psychiatry: Judgement and insight appear normal. Mood & affect appropriate.     Data Reviewed: I have personally reviewed following labs and imaging studies  CBC: Recent Labs  Lab 03/20/21 0407 03/21/21 0539 03/22/21 0634  WBC 9.0 8.7 8.2  NEUTROABS  --   --  5.9  HGB 7.3* 7.4* 7.3*  HCT 23.2* 23.2* 23.3*  MCV 99.1 100.9* 100.0  PLT 222 229 659   Basic Metabolic Panel: Recent Labs  Lab 03/19/21 0413 03/20/21 0407 03/21/21 0539 03/22/21 0634 03/23/21 0309 03/24/21 0334 03/25/21 0343  NA 139 140 142 140 142 144 143  K 4.4 4.1 4.2 4.2 3.9 4.0 3.7  CL 108 109 112* 110 112* 114* 114*  CO2 25 25 24 25 26 23 23   GLUCOSE 132* 140* 146* 118* 124* 88 79  BUN 76* 70* 63* 59* 59* 51* 43*  CREATININE 1.55* 1.40* 1.32* 1.34* 1.41* 1.30* 1.51*  CALCIUM 8.2* 8.5* 8.6* 8.5* 8.5* 8.4* 8.2*  MG 2.4 2.2 2.1 2.0 2.0  --   --   PHOS 3.4 2.9 3.2 3.5 3.5  --   --  GFR: Estimated Creatinine Clearance: 19.1 mL/min (A) (by C-G formula based on SCr of 1.51 mg/dL (H)). Liver Function Tests: Recent Labs  Lab 03/19/21 0413 03/20/21 0407 03/21/21 0539 03/22/21 0634 03/24/21 0334  AST 140* 65* 39 32 30  ALT 235* 159* 114* 87* 62*  ALKPHOS 69 72 74 78 80  BILITOT 0.4 0.6 0.5 0.5 0.7  PROT 4.9* 4.9* 5.2* 5.2* 5.4*  ALBUMIN 2.9* 2.9* 3.0* 2.9* 3.0*   Recent Labs  Lab 03/18/21 1518  AMYLASE 113*   No results for input(s): AMMONIA in the last 168  hours. Coagulation Profile: No results for input(s): INR, PROTIME in the last 168 hours. Cardiac Enzymes: No results for input(s): CKTOTAL, CKMB, CKMBINDEX, TROPONINI in the last 168 hours. BNP (last 3 results) No results for input(s): PROBNP in the last 8760 hours. HbA1C: No results for input(s): HGBA1C in the last 72 hours. CBG: Recent Labs  Lab 03/23/21 2354 03/24/21 0627 03/24/21 1201 03/24/21 2355 03/25/21 0559  GLUCAP 84 87 84 92 83   Lipid Profile: No results for input(s): CHOL, HDL, LDLCALC, TRIG, CHOLHDL, LDLDIRECT in the last 72 hours.  Thyroid Function Tests: No results for input(s): TSH, T4TOTAL, FREET4, T3FREE, THYROIDAB in the last 72 hours. Anemia Panel: No results for input(s): VITAMINB12, FOLATE, FERRITIN, TIBC, IRON, RETICCTPCT in the last 72 hours. Sepsis Labs: No results for input(s): PROCALCITON, LATICACIDVEN in the last 168 hours.  No results found for this or any previous visit (from the past 240 hour(s)).       Radiology Studies: No results found.      Scheduled Meds:  acetaminophen  1,000 mg Oral TID   amLODipine  5 mg Oral Daily   bethanechol  5 mg Oral TID   carvedilol  6.25 mg Oral BID WC   chlorhexidine  15 mL Mouth Rinse BID   Chlorhexidine Gluconate Cloth  6 each Topical Daily   cholecalciferol  2,000 Units Oral Daily   enoxaparin (LOVENOX) injection  30 mg Subcutaneous Q24H   feeding supplement (GLUCERNA SHAKE)  237 mL Oral TID BM   hydrALAZINE  50 mg Oral Q8H   insulin aspart  0-9 Units Subcutaneous Q6H   mouth rinse  15 mL Mouth Rinse q12n4p   melatonin  3 mg Oral QHS   pantoprazole  40 mg Oral BID   polycarbophil  625 mg Oral BID   rosuvastatin  10 mg Oral Daily   sodium chloride flush  10-40 mL Intracatheter Q12H   Continuous Infusions:   LOS: 19 days    Time spent: 38 minutes spent on chart review, discussion with nursing staff, consultants, updating family and interview/physical exam; more than 50% of that time  was spent in counseling and/or coordination of care.    Sarp Vernier J [place] DO [facility] Available via Epic secure chat 7am-7pm After these hours, please refer to coverage provider listed on [email or web address] 03/25/2021, 11:46 AM

## 2021-03-26 LAB — GLUCOSE, CAPILLARY
Glucose-Capillary: 87 mg/dL (ref 70–99)
Glucose-Capillary: 112 mg/dL — ABNORMAL HIGH (ref 70–99)

## 2021-03-26 NOTE — Progress Notes (Signed)
Consultation PROGRESS NOTE    Kathleen Jimenez  EHU:[id number] DOB: 27-Apr-1931 DOA: 03/05/2021 PCP: Ginger Organ., MD    Brief Narrative:  Kathleen Jimenez is an 88 year old female with past medical history significant for essential hypertension, hyperlipidemia, CKD stage IV who presented to [facility] ED on 12/16 with 1 day history of abdominal pain.  Evaluation in the ED notable for perforated viscus and was taken urgently to the operating room.  Hospitalist service consulted for further evaluation and management of CKD, hypertension and dyslipidemia.   Assessment & Plan:   Principal Problem:   Perforated abdominal viscus Active Problems:   Essential hypertension   Postoperative anemia due to acute blood loss   Carotid stenosis   Duodenal ulcer disease   Acute respiratory failure (HCC)   Malnutrition of moderate degree   Transaminitis   Hyperlipidemia   Acute renal failure superimposed on stage 4 chronic kidney disease (HCC)   Acute urinary retention   Wrist pain, acute, right   Agitation   Perforated abdominal viscus Duodenal ulcer. Patient initially presented to the ED with 1 day history of abdominal pain.  CT chest/abdomen/pelvis with moderate free air and mild-moderate free fluid in the abdomen consistent with perforated hollow viscus.  Patient underwent exploratory laparotomy with Phillip Heal patch repair of perforated duodenal ulcer by general surgery, Dr. Marlou Starks on 03/05/2021.  Upper GI series on 12/21 with mild irregularity proximal duodenum without visible leak.  H. pylori negative. --TPN discontinued 1/3 --Diet advanced to dysphagia 3 diet; continue to encourage increased oral intake --Further per primary, general surgery  Right thyroid mass On CT chest/abdomen/pelvis, incidental finding of 5.2 cm right thyroid heterogeneous mass with patchy calcifications and displacement of the trachea to the left.  Per family, has been present for roughly 79 years and no further work-up  desired at this time.  Transaminitis improving. --AST 46>>>231>>>30 --ALT 50>>264>>>62 --Tbili in normal limits --Holding home Crestor; TPN now discontinued  Acute urinary retention Foley catheter placed for elevated retained urine in bladder on 1/3. --Continue Foley catheter for now --Bethanechol 5 mg p.o. 3 times daily --Encourage increased mobilization  Essential hypertension Home regimen includes hydralazine 25mg  PO twice daily, carvedilol 6.25 mg PO twice daily, Amlodipine 5 mg p.o. daily. --Hydralazine increased to 50 mg PO TID --Amlodipine 5 mg p.o. daily --Carvedilol 6.25mg  PO BID --Continue monitor BP and adjust as needed  Postoperative anemia secondary to acute blood loss Anemia of chronic medical disease Baseline hemoglobin 9, on presentation hemoglobin 9.6's and postoperatively dropped down to 6.8; status post 1 unit pRBCs. --Continue monitor CBC twice weekly --Transfuse for hemoglobin less than 7.0  Hyperlipidemia --Holding home Crestor due to elevated LFTs  Acute renal failure on CKD stage IV Baseline creatinine 1.7.  Creatinine initially trended up to a peak of 2.49. --Creatinine 1.51 on 1/5 --Avoid nephrotoxins, renal dose all medications  Acute hypoxic respiratory failure, POA: Resolved On presentation to the ED, patient was noted to be 87% on room air.  Likely secondary to perforated viscus as above.  Has been titrated off of supplemental oxygen. --Continue SPO2 checks with vital signs  Agitation: Resolved Would avoid benzodiazepines/Haldol.  Hx carotid artery stenosis --Currently holding home aspirin/statin  Moderate protein calorie malnutrition Body mass index is 17.69 kg/m. Nutrition Status: Nutrition Problem: Moderate Malnutrition Etiology: chronic illness Signs/Symptoms: moderate fat depletion, severe muscle depletion, mild muscle depletion Interventions: TPN --TPN discontinued 1/3, diet advance --Dietitian following, continue  supplementation, patient requires feeding assistance, continue to encourage increased oral  intake --Consider Marinol if no improvement in oral intake in the next few days --Family okay with IR PEG tube placement if necessary  Weakness/deconditioning/debility: --PT/OT currently recommending SNF placement --Continue therapy efforts while inpatient   DVT prophylaxis: enoxaparin (LOVENOX) injection 30 mg Start: 03/07/21 0800 SCD's Start: 03/06/21 0147   Code Status: Full Code Family Communication: updated patient's son, Dr. Harlow Asa yesterday morning at bedside, no family present this morning.  Disposition Plan:  Level of care: Med-Surg Status is: Inpatient   Procedures:  Exploratory laparotomy with Phillip Heal patch, Dr. Marlou Starks 12/16  Antimicrobials:  Zosyn 12/16 - 12/23    Subjective: Patient seen examined at bedside, resting comfortably.  Lying in bed.  RN present.  Reports not hungry this morning.  No other questions or concerns at this time.  Denies headache, no chest pain, no shortness of breath, no abdominal pain.  No acute events overnight reported by nursing staff.  Objective: Vitals:   03/25/21 0600 03/25/21 1344 03/25/21 2050 03/26/21 0522  BP: (!) 179/80 (!) 135/58 (!) 152/56 (!) 162/59  Pulse: 90 64 67 62  Resp: 18 16 14 14   Temp: (!) 97.3 F (36.3 C) 98.3 F (36.8 C) 98.4 F (36.9 C) 98 F (36.7 C)  TempSrc: Oral  Oral Oral  SpO2: 94% 98% 97% 96%  Weight: 46.2 kg   45.3 kg  Height:        Intake/Output Summary (Last 24 hours) at 03/26/2021 1247 Last data filed at 03/26/2021 1028 Gross per 24 hour  Intake 340 ml  Output 750 ml  Net -410 ml   Filed Weights   03/23/21 0918 03/25/21 0600 03/26/21 0522  Weight: 49.6 kg 46.2 kg 45.3 kg    Examination:  General exam: Appears calm and comfortable, frail/elderly in appearance Respiratory system: Clear to auscultation. Respiratory effort normal.  On room air Cardiovascular system: S1 & S2 heard, RRR. No JVD, murmurs,  rubs, gallops or clicks. No pedal edema. Gastrointestinal system: Abdomen is nondistended, soft and nontender. No organomegaly or masses felt. Normal bowel sounds heard.  Abdominal dressing noted in place, clean/dry/intact. GU: Foley catheter noted with clear yellow urine in collection bag Central nervous system: Alert and oriented. No focal neurological deficits. Extremities: Symmetric 5 x 5 power. Skin: No rashes, lesions or ulcers Psychiatry: Judgement and insight appear normal. Mood & affect appropriate.     Data Reviewed: I have personally reviewed following labs and imaging studies  CBC: Recent Labs  Lab 03/20/21 0407 03/21/21 0539 03/22/21 0634  WBC 9.0 8.7 8.2  NEUTROABS  --   --  5.9  HGB 7.3* 7.4* 7.3*  HCT 23.2* 23.2* 23.3*  MCV 99.1 100.9* 100.0  PLT 222 229 751   Basic Metabolic Panel: Recent Labs  Lab 03/20/21 0407 03/21/21 0539 03/22/21 0634 03/23/21 0309 03/24/21 0334 03/25/21 0343  NA 140 142 140 142 144 143  K 4.1 4.2 4.2 3.9 4.0 3.7  CL 109 112* 110 112* 114* 114*  CO2 25 24 25 26 23 23   GLUCOSE 140* 146* 118* 124* 88 79  BUN 70* 63* 59* 59* 51* 43*  CREATININE 1.40* 1.32* 1.34* 1.41* 1.30* 1.51*  CALCIUM 8.5* 8.6* 8.5* 8.5* 8.4* 8.2*  MG 2.2 2.1 2.0 2.0  --   --   PHOS 2.9 3.2 3.5 3.5  --   --    GFR: Estimated Creatinine Clearance: 18.8 mL/min (A) (by C-G formula based on SCr of 1.51 mg/dL (H)). Liver Function Tests: Recent Labs  Lab 03/20/21 0407  03/21/21 0539 03/22/21 0634 03/24/21 0334  AST 65* 39 32 30  ALT 159* 114* 87* 62*  ALKPHOS 72 74 78 80  BILITOT 0.6 0.5 0.5 0.7  PROT 4.9* 5.2* 5.2* 5.4*  ALBUMIN 2.9* 3.0* 2.9* 3.0*   No results for input(s): LIPASE, AMYLASE in the last 168 hours.  No results for input(s): AMMONIA in the last 168 hours. Coagulation Profile: No results for input(s): INR, PROTIME in the last 168 hours. Cardiac Enzymes: No results for input(s): CKTOTAL, CKMB, CKMBINDEX, TROPONINI in the last 168  hours. BNP (last 3 results) No results for input(s): PROBNP in the last 8760 hours. HbA1C: No results for input(s): HGBA1C in the last 72 hours. CBG: Recent Labs  Lab 03/25/21 0559 03/25/21 1731 03/25/21 2357 03/26/21 0524 03/26/21 1219  GLUCAP 83 91 98 87 112*   Lipid Profile: No results for input(s): CHOL, HDL, LDLCALC, TRIG, CHOLHDL, LDLDIRECT in the last 72 hours.  Thyroid Function Tests: No results for input(s): TSH, T4TOTAL, FREET4, T3FREE, THYROIDAB in the last 72 hours. Anemia Panel: No results for input(s): VITAMINB12, FOLATE, FERRITIN, TIBC, IRON, RETICCTPCT in the last 72 hours. Sepsis Labs: No results for input(s): PROCALCITON, LATICACIDVEN in the last 168 hours.  No results found for this or any previous visit (from the past 240 hour(s)).       Radiology Studies: No results found.      Scheduled Meds:  acetaminophen  1,000 mg Oral TID   amLODipine  5 mg Oral Daily   bethanechol  5 mg Oral TID   carvedilol  6.25 mg Oral BID WC   chlorhexidine  15 mL Mouth Rinse BID   Chlorhexidine Gluconate Cloth  6 each Topical Daily   cholecalciferol  2,000 Units Oral Daily   enoxaparin (LOVENOX) injection  30 mg Subcutaneous Q24H   feeding supplement (GLUCERNA SHAKE)  237 mL Oral TID BM   hydrALAZINE  50 mg Oral Q8H   insulin aspart  0-9 Units Subcutaneous Q6H   mouth rinse  15 mL Mouth Rinse q12n4p   melatonin  3 mg Oral QHS   pantoprazole  40 mg Oral BID   polycarbophil  625 mg Oral BID   rosuvastatin  10 mg Oral Daily   sodium chloride flush  10-40 mL Intracatheter Q12H   Continuous Infusions:   LOS: 20 days    Time spent: 36 minutes spent on chart review, discussion with nursing staff, consultants, updating family and interview/physical exam; more than 50% of that time was spent in counseling and/or coordination of care.    Fergus Throne J [place] DO [facility] Available via Epic secure chat 7am-7pm After these hours, please refer to coverage  provider listed on [email or web address] 03/26/2021, 12:47 PM

## 2021-03-26 NOTE — Plan of Care (Signed)

## 2021-03-26 NOTE — Progress Notes (Signed)
21 Days Post-Op   Subjective/Chief Complaint: Alert and oriented. No new complaints. Denies pain. Only had a sip of juice so far this AM. We discussed taking out her foley catheter and she states she would like to try to have it removed today.  Objective: Vital signs in last 24 hours: Temp:  [98 F (36.7 C)-98.4 F (36.9 C)] 98 F (36.7 C) (01/06 0522) Pulse Rate:  [62-67] 62 (01/06 0522) Resp:  [14-16] 14 (01/06 0522) BP: (135-162)/(56-59) 162/59 (01/06 0522) SpO2:  [96 %-98 %] 96 % (01/06 0522) Weight:  [45.3 kg] 45.3 kg (01/06 0522) Last BM Date: 03/25/21  Intake/Output from previous day: 01/05 0701 - 01/06 0700 In: 400 [P.O.:400] Out: 550 [Urine:550] Intake/Output this shift: Total I/O In: 60 [P.O.:60] Out: 200 [Urine:200]  General: resting comfortably, NAD Neuro: alert and pleasant Resp: normal work of breathing on room air Abdomen: soft, nondistended, nontender to palpation. Midline wound with dressing in tact. Extremities: warm and well-perfused  BMET Recent Labs    03/24/21 0334 03/25/21 0343  NA 144 143  K 4.0 3.7  CL 114* 114*  CO2 23 23  GLUCOSE 88 79  BUN 51* 43*  CREATININE 1.30* 1.51*  CALCIUM 8.4* 8.2*   PT/INR No results for input(s): LABPROT, INR in the last 72 hours. ABG No results for input(s): PHART, HCO3 in the last 72 hours.  Invalid input(s): PCO2, PO2  Studies/Results: No results found.  Anti-infectives: Anti-infectives (From admission, onward)    Start     Dose/Rate Route Frequency Ordered Stop   03/06/21 0400  piperacillin-tazobactam (ZOSYN) IVPB 2.25 g        2.25 g 100 mL/hr over 30 Minutes Intravenous Every 6 hours 03/06/21 0216 03/12/21 1707   03/05/21 2115  piperacillin-tazobactam (ZOSYN) IVPB 3.375 g        3.375 g 100 mL/hr over 30 Minutes Intravenous  Once 03/05/21 2101 03/05/21 2203       Assessment/Plan: s/p Procedure(s): EXPLORATORY LAPAROTOMY and graham patch repair of perforated duodenal ulcer  (N/A) Advance diet. Encourage po's PT/OT Perforated duodenal ulcer S/P exploratory laparotomy with Phillip Heal patch repair 03/05/21 Dr. Marlou Starks - UGI without leak  - soft diet as tolerated, glucerna TID; not eating much - continue to mobilize with PT/OT - off IV abx - BID PPI (oral) - H pylori is negative    FEN: soft diet as tolerated. glucerna TID VTE: LMWH Foley: replaced 1/3 for retention, urecholine; D/C foley, OOB to bedside commode for urination. ID: off abx; WBC normal  Below per medical service HTN: On oral norvasc and hydralazine, appreciate TRH assistance HLD CKD stage IV - Cr stable  Carotid stenosis  Chronic anemia - hgb stable  Moderate protein calorie malnutrition - encourage PO intake Physical deconditioning  Urinary retention- replacement of foley x2.  Dispo: voiding trial. encourage PO intake.  PT/OT recommending SNF, discussions with family ongoing.      LOS: 20 days    Kathleen Jimenez 03/26/2021

## 2021-03-26 NOTE — [place] FL2 (Signed)
[place] MEDICAID FL2 LEVEL OF CARE SCREENING TOOL     IDENTIFICATION  Patient Name: Kathleen Jimenez Birthdate: 1931-11-16 Sex: female Admission Date (Current Location): 03/05/2021  [facility] and [place] Number:  Herbalist and Address:  [facility],  [place] [address], [place]      Provider Number: [id number]  Attending Physician Name and Address:  Edison Pace, Md, MD  Relative Name and Phone Number:  Tijah Hane (son) Ph: [phone]    Current Level of Care: Hospital Recommended Level of Care: [place] Prior Approval Number:    Date Approved/Denied:   PASRR Number: [id number]  Discharge Plan: SNF    Current Diagnoses: Patient Active Problem List   Diagnosis Date Noted   Acute urinary retention 03/23/2021   Wrist pain, acute, right 03/23/2021   Agitation 03/23/2021   Transaminitis    Hyperlipidemia    Acute renal failure superimposed on stage 4 chronic kidney disease ([place])    Malnutrition of moderate degree 03/10/2021   Duodenal ulcer disease 03/06/2021   Acute respiratory failure ([place])    Perforated abdominal viscus    Carotid stenosis 05/19/2017   UTI due to Klebsiella species 12/30/2016   Chronic kidney disease    Closed intertrochanteric fracture of left hip ([place]) 12/27/2016   Acute blood loss anemia 12/27/2016   Hip pain, acute, left 12/26/2016   ARF (acute renal failure) ([place]) 12/26/2016   Essential hypertension 12/26/2016   Postoperative anemia due to acute blood loss 12/26/2016   Dehydration 12/26/2016    Orientation RESPIRATION BLADDER Height & Weight     Self, Situation, Time, Place  Normal Continent Weight: 99 lb 13.9 oz (45.3 kg) Height:  5\' 3"  (160 cm)  BEHAVIORAL SYMPTOMS/MOOD NEUROLOGICAL BOWEL NUTRITION STATUS      Continent Diet (regular)  AMBULATORY STATUS COMMUNICATION OF NEEDS Skin   Limited Assist Verbally Surgical wounds, Other (Comment) (Ecchymosis: left hip)                        Personal Care Assistance Level of Assistance  Bathing, Feeding, Dressing Bathing Assistance: Limited assistance Feeding assistance: Limited assistance Dressing Assistance: Limited assistance     Functional Limitations Info  Sight, Hearing, Speech Sight Info: Impaired Hearing Info: Adequate Speech Info: Adequate    SPECIAL CARE FACTORS FREQUENCY  PT (By licensed PT), OT (By licensed OT)     PT Frequency: 5x's/week OT Frequency: 5x's/week            Contractures Contractures Info: Not present    Additional Factors Info  Code Status, Allergies, Insulin Sliding Scale Code Status Info: Full Allergies Info: NKA   Insulin Sliding Scale Info: See discharge summary       Current Medications (03/26/2021):  This is the current hospital active medication list Current Facility-Administered Medications  Medication Dose Route Frequency Provider Last Rate Last Admin   acetaminophen (TYLENOL) tablet 1,000 mg  1,000 mg Oral TID Michael Boston, MD   1,000 mg at 03/26/21 1001   albuterol (PROVENTIL) (2.5 MG/3ML) 0.083% nebulizer solution 2.5 mg  2.5 mg Nebulization Q4H PRN Eugenie Filler, MD       alum & mag hydroxide-simeth (MAALOX/MYLANTA) 200-200-20 MG/5ML suspension 30 mL  30 mL Oral Q6H PRN Michael Boston, MD       amLODipine (NORVASC) tablet 5 mg  5 mg Oral Daily [place] Donnamarie Poag, DO   5 mg at 03/26/21 1002   bethanechol (URECHOLINE) tablet 5 mg  5 mg Oral TID Donnie Mesa, MD   5 mg at 03/26/21 1001   carvedilol (COREG) tablet 6.25 mg  6.25 mg Oral BID WC [place] Eric J, DO   6.25 mg at 03/26/21 8469   chlorhexidine (PERIDEX) 0.12 % solution 15 mL  15 mL Mouth Rinse BID Leighton Ruff, MD   15 mL at 03/26/21 1002   Chlorhexidine Gluconate Cloth 2 % PADS 6 each  6 each Topical Daily Leighton Ruff, MD   6 each at 03/25/21 1140   cholecalciferol (VITAMIN D3) tablet 2,000 Units  2,000 Units Oral Daily Michael Boston, MD   2,000 Units at 03/26/21 1002   enoxaparin (LOVENOX)  injection 30 mg  30 mg Subcutaneous G29B Leighton Ruff, MD   30 mg at 03/26/21 0838   feeding supplement (GLUCERNA SHAKE) (GLUCERNA SHAKE) liquid 237 mL  237 mL Oral TID BM Arrien, Jimmy Picket, MD   237 mL at 03/26/21 1433   hydrALAZINE (APRESOLINE) injection 10 mg  10 mg Intravenous Q6H PRN Lavina Hamman, MD       hydrALAZINE (APRESOLINE) tablet 50 mg  50 mg Oral Q8H Lavina Hamman, MD   50 mg at 03/26/21 1433   insulin aspart (novoLOG) injection 0-9 Units  0-9 Units Subcutaneous Q6H Reginia Naas, RPH   1 Units at 03/23/21 1241   lip balm (CARMEX) ointment   Topical PRN Leighton Ruff, MD   75 application at 28/41/32 4401   magic mouthwash  15 mL Oral QID PRN Michael Boston, MD       MEDLINE mouth rinse  15 mL Mouth Rinse [id number] Leighton Ruff, MD   15 mL at 03/26/21 1224   melatonin tablet 3 mg  3 mg Oral QHS Leighton Ruff, MD   3 mg at 66/44/03 2248   menthol-cetylpyridinium (CEPACOL) lozenge 3 mg  1 lozenge Oral PRN Michael Boston, MD       metoprolol tartrate (LOPRESSOR) injection 2.5-5 mg  2.5-5 mg Intravenous K7Q PRN Leighton Ruff, MD   5 mg at 25/95/63 0617   ondansetron (ZOFRAN-ODT) disintegrating tablet 4 mg  4 mg Oral O7F PRN Leighton Ruff, MD       Or   ondansetron [facility]) injection 4 mg  4 mg Intravenous I4P PRN Leighton Ruff, MD   4 mg at 32/95/18 0559   pantoprazole (PROTONIX) EC tablet 40 mg  40 mg Oral BID ArrienJimmy Picket, MD   40 mg at 03/26/21 1002   phenol (CHLORASEPTIC) mouth spray 2 spray  2 spray Mouth/Throat PRN Michael Boston, MD       polycarbophil (FIBERCON) tablet 625 mg  625 mg Oral BID Michael Boston, MD   625 mg at 03/26/21 1001   rosuvastatin (CRESTOR) tablet 10 mg  10 mg Oral Daily Michael Boston, MD   10 mg at 03/26/21 1002   simethicone (MYLICON) 40 AC/1.6SA suspension 80 mg  80 mg Oral QID PRN Michael Boston, MD       sodium chloride flush (NS) 0.9 % injection 10-40 mL  10-40 mL Intracatheter Y30Z Leighton Ruff, MD   10 mL at 03/25/21  2250   sodium chloride flush (NS) 0.9 % injection 10-40 mL  10-40 mL Intracatheter PRN Leighton Ruff, MD   10 mL at 03/26/21 0350   traZODone (DESYREL) tablet 50 mg  50 mg Oral QHS PRN Arrien, Jimmy Picket, MD   50 mg at 03/22/21 0147     Discharge Medications: Please see discharge summary for a list of  discharge medications.  Relevant Imaging Results:  Relevant Lab Results:   Additional Information SSN: [id number]  Lennart Pall, LCSW

## 2021-03-26 NOTE — Progress Notes (Signed)
Physical Therapy Treatment Patient Details Name: Kathleen Jimenez MRN: [id number] DOB: January 05, 1932 Today's Date: 03/26/2021   History of Present Illness 88 yo female  s/p exploratory laparotomy for perforated duodenal ulcer on 12/16. PMH : IM nail, lumbar fusion, CKD    PT Comments    Patient progressing slowly but initiated gait today and ambulated 2 short bouts of ~10' and 6' after ~2 minutes seated rest. Pt with significant LE weakness and bil LE's buckling during gait requiring close chair follow and mod assist to maintain balance. EOS pt returned to recliner and completed repeated sit<>stands with improved initiation using bil UE's for power up. Continue to recommend SNF rehab and acute PT will continue to progress as able,    Recommendations for follow up therapy are one component of a multi-disciplinary discharge planning process, led by the attending physician.  Recommendations may be updated based on patient status, additional functional criteria and insurance authorization.  Follow Up Recommendations  Skilled nursing-short term rehab (<3 hours/day)     Assistance Recommended at Discharge    Patient can return home with the following     Equipment Recommendations  None recommended by PT    Recommendations for Other Services       Precautions / Restrictions Precautions Precautions: Fall Precaution Comments: incontinent of urine Restrictions Weight Bearing Restrictions: No     Mobility  Bed Mobility               General bed mobility comments: pt OOB in recliner    Transfers Overall transfer level: Needs assistance Equipment used: Rolling walker (2 wheels) Transfers: Sit to/from Stand Sit to Stand: Min assist;+2 safety/equipment           General transfer comment: actively resistant to attempts to stand    Ambulation/Gait Ambulation/Gait assistance: Mod assist;+2 safety/equipment Gait Distance (Feet): 16 Feet (10,6) Assistive device: Rolling walker (2  wheels) Gait Pattern/deviations: Step-to pattern;Decreased step length - right;Decreased step length - left;Shuffle;Narrow base of support;Trunk flexed Gait velocity: decr     General Gait Details: pt required some encouragement to ambulate and fatigued quickly. knees buckling due to weakness and Mod assist to steady. 2 short bouts of ~10', and 6'.   Stairs             Wheelchair Mobility    Modified Rankin (Stroke Patients Only)       Balance Overall balance assessment: Needs assistance Sitting-balance support: Feet supported;Bilateral upper extremity supported;No upper extremity supported Sitting balance-Leahy Scale: Poor Sitting balance - Comments: min assist for balance, sat x 8 minutes at EOB                                    Cognition Arousal/Alertness: Awake/alert Behavior During Therapy: Flat affect Overall Cognitive Status: No family/caregiver present to determine baseline cognitive functioning                                 General Comments: pt repeating, "No, I just want you to hold my hand."        Exercises Other Exercises Other Exercises: 5x sit<>stand with bil UE support    General Comments        Pertinent Vitals/Pain Pain Assessment: Faces Faces Pain Scale: Hurts a little bit Pain Location: abdomen Pain Descriptors / Indicators: Grimacing Pain Intervention(s): Limited activity within patient's tolerance;Monitored during  session;Repositioned    Home Living                          Prior Function            PT Goals (current goals can now be found in the care plan section) Acute Rehab PT Goals PT Goal Formulation: With patient Time For Goal Achievement: 04/05/21 Potential to Achieve Goals: Fair Progress towards PT goals: Progressing toward goals    Frequency    Min 2X/week      PT Plan Current plan remains appropriate    Co-evaluation              AM-PAC PT "6 Clicks"  Mobility   Outcome Measure  Help needed turning from your back to your side while in a flat bed without using bedrails?: A Little Help needed moving from lying on your back to sitting on the side of a flat bed without using bedrails?: A Little Help needed moving to and from a bed to a chair (including a wheelchair)?: A Lot Help needed standing up from a chair using your arms (e.g., wheelchair or bedside chair)?: Total Help needed to walk in hospital room?: Total Help needed climbing 3-5 steps with a railing? : Total 6 Click Score: 11    End of Session Equipment Utilized During Treatment: Gait belt Activity Tolerance: Patient limited by fatigue Patient left: in chair;with call bell/phone within reach;with chair alarm set;with family/visitor present Nurse Communication: Mobility status PT Visit Diagnosis: Other abnormalities of gait and mobility (R26.89);Muscle weakness (generalized) (M62.81);Difficulty in walking, not elsewhere classified (R26.2)     Time: 8756-4332 PT Time Calculation (min) (ACUTE ONLY): 18 min  Charges:  $Gait Training: 8-22 mins                     Verner Mould, DPT Acute Rehabilitation Services Office [phone] Pager [phone]    Jacques Navy 03/26/2021, 12:24 PM

## 2021-03-27 LAB — CBC
HCT: 22.9 % — ABNORMAL LOW (ref 36.0–46.0)
Hemoglobin: 7.2 g/dL — ABNORMAL LOW (ref 12.0–15.0)
MCH: 32 pg (ref 26.0–34.0)
MCHC: 31.4 g/dL (ref 30.0–36.0)
MCV: 101.8 fL — ABNORMAL HIGH (ref 80.0–100.0)
Platelets: 191 10*3/uL (ref 150–400)
RBC: 2.25 MIL/uL — ABNORMAL LOW (ref 3.87–5.11)
RDW: 14.6 % (ref 11.5–15.5)
WBC: 6.8 10*3/uL (ref 4.0–10.5)
nRBC: 0 % (ref 0.0–0.2)

## 2021-03-27 LAB — GLUCOSE, CAPILLARY
Glucose-Capillary: 108 mg/dL — ABNORMAL HIGH (ref 70–99)
Glucose-Capillary: 119 mg/dL — ABNORMAL HIGH (ref 70–99)
Glucose-Capillary: 121 mg/dL — ABNORMAL HIGH (ref 70–99)
Glucose-Capillary: 83 mg/dL (ref 70–99)
Glucose-Capillary: 88 mg/dL (ref 70–99)

## 2021-03-27 LAB — BASIC METABOLIC PANEL
Anion gap: 5 (ref 5–15)
BUN: 33 mg/dL — ABNORMAL HIGH (ref 8–23)
CO2: 22 mmol/L (ref 22–32)
Calcium: 8 mg/dL — ABNORMAL LOW (ref 8.9–10.3)
Chloride: 113 mmol/L — ABNORMAL HIGH (ref 98–111)
Creatinine, Ser: 1.47 mg/dL — ABNORMAL HIGH (ref 0.44–1.00)
GFR, Estimated: 34 mL/min — ABNORMAL LOW (ref >60)
Glucose, Bld: 106 mg/dL — ABNORMAL HIGH (ref 70–99)
Potassium: 3.1 mmol/L — ABNORMAL LOW (ref 3.5–5.1)
Sodium: 140 mmol/L (ref 135–145)

## 2021-03-27 LAB — MAGNESIUM: Magnesium: 2.1 mg/dL (ref 1.7–2.4)

## 2021-03-27 MED ORDER — SODIUM CHLORIDE 0.9 % IV BOLUS
250.0000 mL | Freq: Once | INTRAVENOUS | Status: AC
  Administered 2021-03-27: 250 mL via INTRAVENOUS

## 2021-03-27 MED ORDER — DRONABINOL 2.5 MG PO CAPS
2.5000 mg | ORAL_CAPSULE | Freq: Two times a day (BID) | ORAL | Status: DC
  Administered 2021-03-27 – 2021-03-28 (×4): 2.5 mg via ORAL
  Filled 2021-03-27 (×4): qty 1

## 2021-03-27 MED ORDER — POTASSIUM CHLORIDE 20 MEQ PO PACK
40.0000 meq | PACK | ORAL | Status: AC
  Administered 2021-03-27 (×2): 40 meq via ORAL
  Filled 2021-03-27 (×2): qty 2

## 2021-03-27 NOTE — Progress Notes (Signed)
Consultation PROGRESS NOTE    Kathleen Jimenez  ACZ:[id number] DOB: 02/16/1932 DOA: 03/05/2021 PCP: Ginger Organ., MD    Brief Narrative:  Kathleen Jimenez is an 88 year old female with past medical history significant for essential hypertension, hyperlipidemia, CKD stage IV who presented to [facility] ED on 12/16 with 1 day history of abdominal pain.  Evaluation in the ED notable for perforated viscus and was taken urgently to the operating room.  Hospitalist service consulted for further evaluation and management of CKD, hypertension and dyslipidemia.   Assessment & Plan:   Principal Problem:   Perforated abdominal viscus Active Problems:   Essential hypertension   Postoperative anemia due to acute blood loss   Carotid stenosis   Duodenal ulcer disease   Acute respiratory failure (HCC)   Malnutrition of moderate degree   Transaminitis   Hyperlipidemia   Acute renal failure superimposed on stage 4 chronic kidney disease (HCC)   Acute urinary retention   Wrist pain, acute, right   Agitation   Perforated abdominal viscus Duodenal ulcer. Patient initially presented to the ED with 1 day history of abdominal pain.  CT chest/abdomen/pelvis with moderate free air and mild-moderate free fluid in the abdomen consistent with perforated hollow viscus.  Patient underwent exploratory laparotomy with Phillip Heal patch repair of perforated duodenal ulcer by general surgery, Dr. Marlou Starks on 03/05/2021.  Upper GI series on 12/21 with mild irregularity proximal duodenum without visible leak.  H. pylori negative. --TPN discontinued 1/3 --Diet advanced to dysphagia 3 diet; continue to encourage increased oral intake --Marinol 2.5 mg p.o. twice daily w/ meals --Further per primary, general surgery  Right thyroid mass On CT chest/abdomen/pelvis, incidental finding of 5.2 cm right thyroid heterogeneous mass with patchy calcifications and displacement of the trachea to the left.  Per family, has been present for  roughly 30 years and no further work-up desired at this time.  Transaminitis improving. --AST 46>>>231>>>30 --ALT 50>>264>>>62 --Tbili in normal limits --Holding home Crestor; TPN now discontinued  Acute urinary retention Foley catheter placed for elevated retained urine in bladder on 1/3. --Continue Foley catheter for now --Bethanechol 5 mg p.o. 3 times daily --Encourage increased mobilization  Essential hypertension Home regimen includes hydralazine 25mg  PO twice daily, carvedilol 6.25 mg PO twice daily, Amlodipine 5 mg p.o. daily. --Hydralazine increased to 50 mg PO TID --Amlodipine 5 mg p.o. daily --Carvedilol 6.25mg  PO BID --Continue monitor BP and adjust as needed  Postoperative anemia secondary to acute blood loss Anemia of chronic medical disease Baseline hemoglobin 9, on presentation hemoglobin 9.6's and postoperatively dropped down to 6.8; status post 1 unit pRBCs. --Continue monitor CBC twice weekly; hemoglobin 7.2 this morning --Transfuse for hemoglobin less than 7.0  Hyperlipidemia --Holding home Crestor due to elevated LFTs  Acute renal failure on CKD stage IV Baseline creatinine 1.7.  Creatinine initially trended up to a peak of 2.49. --Creatinine 1.47 on 1/7 --Avoid nephrotoxins, renal dose all medications  Acute hypoxic respiratory failure, POA: Resolved On presentation to the ED, patient was noted to be 87% on room air.  Likely secondary to perforated viscus as above.  Has been titrated off of supplemental oxygen. --Continue SPO2 checks with vital signs  Agitation: Resolved Would avoid benzodiazepines/Haldol.  Hx carotid artery stenosis --Currently holding home aspirin/statin  Moderate protein calorie malnutrition Body mass index is 17.73 kg/m. Nutrition Status: Nutrition Problem: Moderate Malnutrition Etiology: chronic illness Signs/Symptoms: moderate fat depletion, severe muscle depletion, mild muscle depletion Interventions: TPN --TPN  discontinued 1/3, diet advance --Dietitian  following, continue supplementation, patient requires feeding assistance, continue to encourage increased oral intake --Marinol 2.5 mg p.o. twice daily  Weakness/deconditioning/debility: --PT/OT currently recommending SNF placement; pending bed offers --Continue therapy efforts while inpatient   DVT prophylaxis: enoxaparin (LOVENOX) injection 30 mg Start: 03/07/21 0800 SCD's Start: 03/06/21 0147   Code Status: Full Code Family Communication: updated patient's son, Dr. Harlow Asa this morning.    Disposition Plan:  Level of care: Med-Surg Status is: Inpatient   Procedures:  Exploratory laparotomy with Phillip Heal patch, Dr. Marlou Starks 12/16  Antimicrobials:  Zosyn 12/16 - 12/23    Subjective: Patient seen examined at bedside, resting comfortably.  Lying in bed.  RN present.  Patient had a entire Glucerna shake yesterday. Reports not hungry this morning.  No other questions or concerns at this time.  Discussed with son, Dr. Harlow Asa starting Marinol this morning for poor appetite; in agreement.  Denies headache, no chest pain, no shortness of breath, no abdominal pain.  No acute events overnight reported by nursing staff.  Objective: Vitals:   03/26/21 0522 03/26/21 1248 03/26/21 2102 03/27/21 0531  BP: (!) 162/59 (!) 147/55 (!) 135/54 (!) 167/57  Pulse: 62 70 (!) 58 60  Resp: 14 16 14 14   Temp: 98 F (36.7 C) 98 F (36.7 C) 98.6 F (37 C) 97.7 F (36.5 C)  TempSrc: Oral Oral Oral Oral  SpO2: 96% 97% 97% 96%  Weight: 45.3 kg   45.4 kg  Height:        Intake/Output Summary (Last 24 hours) at 03/27/2021 1133 Last data filed at 03/27/2021 0938 Gross per 24 hour  Intake 240 ml  Output 250 ml  Net -10 ml   Filed Weights   03/25/21 0600 03/26/21 0522 03/27/21 0531  Weight: 46.2 kg 45.3 kg 45.4 kg    Examination:  General exam: Appears calm and comfortable, frail/elderly in appearance Respiratory system: Clear to auscultation. Respiratory  effort normal.  On room air Cardiovascular system: S1 & S2 heard, RRR. No JVD, murmurs, rubs, gallops or clicks. No pedal edema. Gastrointestinal system: Abdomen is nondistended, soft and nontender. No organomegaly or masses felt. Normal bowel sounds heard.  Abdominal dressing noted in place, clean/dry/intact. GU: Foley catheter noted with clear yellow urine in collection bag Central nervous system: Alert and oriented. No focal neurological deficits. Extremities: Symmetric 5 x 5 power. Skin: No rashes, lesions or ulcers Psychiatry: Judgement and insight appear normal. Mood & affect appropriate.     Data Reviewed: I have personally reviewed following labs and imaging studies  CBC: Recent Labs  Lab 03/21/21 0539 03/22/21 0634 03/27/21 0231  WBC 8.7 8.2 6.8  NEUTROABS  --  5.9  --   HGB 7.4* 7.3* 7.2*  HCT 23.2* 23.3* 22.9*  MCV 100.9* 100.0 101.8*  PLT 229 218 678   Basic Metabolic Panel: Recent Labs  Lab 03/21/21 0539 03/22/21 0634 03/23/21 0309 03/24/21 0334 03/25/21 0343 03/27/21 0231 03/27/21 0749  NA 142 140 142 144 143 140  --   K 4.2 4.2 3.9 4.0 3.7 3.1*  --   CL 112* 110 112* 114* 114* 113*  --   CO2 24 25 26 23 23 22   --   GLUCOSE 146* 118* 124* 88 79 106*  --   BUN 63* 59* 59* 51* 43* 33*  --   CREATININE 1.32* 1.34* 1.41* 1.30* 1.51* 1.47*  --   CALCIUM 8.6* 8.5* 8.5* 8.4* 8.2* 8.0*  --   MG 2.1 2.0 2.0  --   --   --  2.1  PHOS 3.2 3.5 3.5  --   --   --   --    GFR: Estimated Creatinine Clearance: 19.3 mL/min (A) (by C-G formula based on SCr of 1.47 mg/dL (H)). Liver Function Tests: Recent Labs  Lab 03/21/21 0539 03/22/21 0634 03/24/21 0334  AST 39 32 30  ALT 114* 87* 62*  ALKPHOS 74 78 80  BILITOT 0.5 0.5 0.7  PROT 5.2* 5.2* 5.4*  ALBUMIN 3.0* 2.9* 3.0*   No results for input(s): LIPASE, AMYLASE in the last 168 hours.  No results for input(s): AMMONIA in the last 168 hours. Coagulation Profile: No results for input(s): INR, PROTIME in the  last 168 hours. Cardiac Enzymes: No results for input(s): CKTOTAL, CKMB, CKMBINDEX, TROPONINI in the last 168 hours. BNP (last 3 results) No results for input(s): PROBNP in the last 8760 hours. HbA1C: No results for input(s): HGBA1C in the last 72 hours. CBG: Recent Labs  Lab 03/25/21 2357 03/26/21 0524 03/26/21 1219 03/27/21 0033 03/27/21 0527  GLUCAP 98 87 112* 119* 108*   Lipid Profile: No results for input(s): CHOL, HDL, LDLCALC, TRIG, CHOLHDL, LDLDIRECT in the last 72 hours.  Thyroid Function Tests: No results for input(s): TSH, T4TOTAL, FREET4, T3FREE, THYROIDAB in the last 72 hours. Anemia Panel: No results for input(s): VITAMINB12, FOLATE, FERRITIN, TIBC, IRON, RETICCTPCT in the last 72 hours. Sepsis Labs: No results for input(s): PROCALCITON, LATICACIDVEN in the last 168 hours.  No results found for this or any previous visit (from the past 240 hour(s)).       Radiology Studies: No results found.      Scheduled Meds:  acetaminophen  1,000 mg Oral TID   amLODipine  5 mg Oral Daily   bethanechol  5 mg Oral TID   carvedilol  6.25 mg Oral BID WC   chlorhexidine  15 mL Mouth Rinse BID   Chlorhexidine Gluconate Cloth  6 each Topical Daily   cholecalciferol  2,000 Units Oral Daily   dronabinol  2.5 mg Oral BID AC   enoxaparin (LOVENOX) injection  30 mg Subcutaneous Q24H   feeding supplement (GLUCERNA SHAKE)  237 mL Oral TID BM   hydrALAZINE  50 mg Oral Q8H   insulin aspart  0-9 Units Subcutaneous Q6H   mouth rinse  15 mL Mouth Rinse q12n4p   melatonin  3 mg Oral QHS   pantoprazole  40 mg Oral BID   polycarbophil  625 mg Oral BID   potassium chloride  40 mEq Oral Q4H   rosuvastatin  10 mg Oral Daily   sodium chloride flush  10-40 mL Intracatheter Q12H   Continuous Infusions:   LOS: 21 days    Time spent: 36 minutes spent on chart review, discussion with nursing staff, consultants, updating family and interview/physical exam; more than 50% of that  time was spent in counseling and/or coordination of care.    Reanne Nellums J [place] DO [facility] Available via Epic secure chat 7am-7pm After these hours, please refer to coverage provider listed on [email or web address] 03/27/2021, 11:33 AM

## 2021-03-27 NOTE — Progress Notes (Signed)
OT Cancellation Note  Patient Details Name: Kathleen Jimenez MRN: [id number] DOB: [birth year]   Cancelled Treatment:    Reason Eval/Treat Not Completed: Patient declined, no reason specified Patient was with family this afternoon. Patient declined to participate in therapy. Session was scheduled for 03/28/21 with patient agreeing to participate. OT to continue to follow and check back as able.  Jackelyn Poling OTR/L, [place] Acute Rehabilitation Department Office# [phone] Pager# [phone]   03/27/2021, 3:58 PM

## 2021-03-27 NOTE — Progress Notes (Signed)
22 Days Post-Op   Subjective/Chief Complaint: Alert and oriented. No new complaints. Denies pain. Drank a whole Glucerna yesterday.  Waiting on one of her sons to come and bring her some [employer] donuts that she likes.  Voiding well after foley removal.  Objective: Vital signs in last 24 hours: Temp:  [97.7 F (36.5 C)-98.6 F (37 C)] 97.7 F (36.5 C) (01/07 0531) Pulse Rate:  [58-70] 60 (01/07 0531) Resp:  [14-16] 14 (01/07 0531) BP: (135-167)/(54-57) 167/57 (01/07 0531) SpO2:  [96 %-97 %] 96 % (01/07 0531) Weight:  [45.4 kg] 45.4 kg (01/07 0531) Last BM Date: 03/25/21  Intake/Output from previous day: 01/06 0701 - 01/07 0700 In: 300 [P.O.:300] Out: 450 [Urine:450] Intake/Output this shift: No intake/output data recorded.  General: resting comfortably, NAD Psych: alert and oriented Resp: normal work of breathing on room air Abdomen: soft, nondistended, nontender to palpation. Midline wound with dressing in tact and wound clean  BMET Recent Labs    03/25/21 0343 03/27/21 0231  NA 143 140  K 3.7 3.1*  CL 114* 113*  CO2 23 22  GLUCOSE 79 106*  BUN 43* 33*  CREATININE 1.51* 1.47*  CALCIUM 8.2* 8.0*   PT/INR No results for input(s): LABPROT, INR in the last 72 hours. ABG No results for input(s): PHART, HCO3 in the last 72 hours.  Invalid input(s): PCO2, PO2  Studies/Results: No results found.  Anti-infectives: Anti-infectives (From admission, onward)    Start     Dose/Rate Route Frequency Ordered Stop   03/06/21 0400  piperacillin-tazobactam (ZOSYN) IVPB 2.25 g        2.25 g 100 mL/hr over 30 Minutes Intravenous Every 6 hours 03/06/21 0216 03/12/21 1707   03/05/21 2115  piperacillin-tazobactam (ZOSYN) IVPB 3.375 g        3.375 g 100 mL/hr over 30 Minutes Intravenous  Once 03/05/21 2101 03/05/21 2203       Assessment/Plan: s/p Procedure(s): EXPLORATORY LAPAROTOMY and graham patch repair of perforated duodenal ulcer (N/A) Advance diet. Encourage  po's PT/OT Perforated duodenal ulcer POD 44, S/P exploratory laparotomy with Phillip Heal patch repair 03/05/21 Dr. Marlou Starks - UGI without leak  - soft diet as tolerated, glucerna TID; not eating much - continue to mobilize with PT/OT - off IV abx - BID PPI (oral) - H pylori is negative    FEN: soft diet as tolerated. glucerna TID VTE: LMWH Foley: replaced 1/3 for retention, urecholine; out on 1/6 and voiding well ID: off abx; WBC normal  Below per medical service HTN: On oral norvasc and hydralazine, appreciate TRH assistance HLD CKD stage IV - Cr stable  Carotid stenosis  Chronic anemia - hgb stable  Moderate protein calorie malnutrition - encourage PO intake Physical deconditioning  Urinary retention- replacement of foley x2.  Dispo: awaiting SNF placement, overall stable.  Cont to encourage oral intake      LOS: 21 days    Henreitta Cea 03/27/2021

## 2021-03-27 NOTE — Progress Notes (Signed)
Pt had irregular heart beat, ranging from 25 to 125 but had no complaints of SOB or chest pain. Rapid Response was called to evaluate and MD was notified. See new orders.

## 2021-03-27 NOTE — Significant Event (Signed)
Rapid Response Event Note   Reason for Call :  Nurse had secretary call because she though patient was bradying down to 25 bpm then taching up to 121 beats per minute  Initial Focused Assessment:   Upon assessment patient was alert, talkative, voicing no complaints. VSS. BP 110/49. On room air.    Interventions:  Patient hooked up to EKG. NSR w/ PACs/PVCs. Vital signs taken.   Plan of Care:  Dr. Harlow Asa instructed to run 250 cc NS bolus over one hour. Did not want patient transferred to ICU or telemetry.    Event Summary:   MD Notified: Bille  Call Time: 1828 Arrival Time: 8841 End Time: 6606  Clarene Critchley, RN

## 2021-03-28 LAB — GLUCOSE, CAPILLARY
Glucose-Capillary: 94 mg/dL (ref 70–99)
Glucose-Capillary: 102 mg/dL — ABNORMAL HIGH (ref 70–99)
Glucose-Capillary: 119 mg/dL — ABNORMAL HIGH (ref 70–99)

## 2021-03-28 NOTE — Progress Notes (Signed)
Consultation PROGRESS NOTE    KATERRA INGMAN  ZOX:[id number] DOB: 12-01-1931 DOA: 03/05/2021 PCP: Ginger Organ., MD    Brief Narrative:  Kathleen Jimenez is an 88 year old female with past medical history significant for essential hypertension, hyperlipidemia, CKD stage IV who presented to [facility] ED on 12/16 with 1 day history of abdominal pain.  Evaluation in the ED notable for perforated viscus and was taken urgently to the operating room.  Hospitalist service consulted for further evaluation and management of CKD, hypertension and dyslipidemia.   Assessment & Plan:   Principal Problem:   Perforated abdominal viscus Active Problems:   Essential hypertension   Postoperative anemia due to acute blood loss   Carotid stenosis   Duodenal ulcer disease   Acute respiratory failure (HCC)   Malnutrition of moderate degree   Transaminitis   Hyperlipidemia   Acute renal failure superimposed on stage 4 chronic kidney disease (HCC)   Acute urinary retention   Wrist pain, acute, right   Agitation   Perforated abdominal viscus Duodenal ulcer. Patient initially presented to the ED with 1 day history of abdominal pain.  CT chest/abdomen/pelvis with moderate free air and mild-moderate free fluid in the abdomen consistent with perforated hollow viscus.  Patient underwent exploratory laparotomy with Phillip Heal patch repair of perforated duodenal ulcer by general surgery, Dr. Marlou Starks on 03/05/2021.  Upper GI series on 12/21 with mild irregularity proximal duodenum without visible leak.  H. pylori negative. --TPN discontinued 1/3 --Diet advanced to dysphagia 3 diet; continue to encourage increased oral intake --Marinol 2.5 mg p.o. twice daily w/ meals --Further per primary, general surgery  Right thyroid mass On CT chest/abdomen/pelvis, incidental finding of 5.2 cm right thyroid heterogeneous mass with patchy calcifications and displacement of the trachea to the left.  Per family, has been present for  roughly 70 years and no further work-up desired at this time.  Transaminitis improving. --AST 46>>>231>>>30 --ALT 50>>264>>>62 --Tbili in normal limits --Holding home Crestor; TPN now discontinued  Acute urinary retention Foley catheter placed for elevated retained urine in bladder on 1/3. --Foley catheter now discontinued --Bethanechol 5 mg p.o. 3 times daily --Encourage increased mobilization --Continue monitor urinary output; bladder scan as needed  Essential hypertension Home regimen includes hydralazine 25mg  PO twice daily, carvedilol 6.25 mg PO twice daily, Amlodipine 5 mg p.o. daily. --Hydralazine 50 mg PO TID --Amlodipine 5 mg p.o. daily --Continue monitor BP and adjust as needed  Postoperative anemia secondary to acute blood loss Anemia of chronic medical disease Baseline hemoglobin 9, on presentation hemoglobin 9.6's and postoperatively dropped down to 6.8; status post 1 unit pRBCs. --Continue monitor CBC twice weekly; hemoglobin 7.2 03/27/2021 --Transfuse for hemoglobin less than 7.0  Hyperlipidemia --Holding home Crestor due to elevated LFTs  Acute renal failure on CKD stage IV Baseline creatinine 1.7.  Creatinine initially trended up to a peak of 2.49. --Creatinine 1.47 on 1/7 --Avoid nephrotoxins, renal dose all medications  Acute hypoxic respiratory failure, POA: Resolved On presentation to the ED, patient was noted to be 87% on room air.  Likely secondary to perforated viscus as above.  Has been titrated off of supplemental oxygen. --Continue SPO2 checks with vital signs  Agitation: Resolved Would avoid benzodiazepines/Haldol.  Hx carotid artery stenosis --Currently holding home aspirin/statin  Moderate protein calorie malnutrition Body mass index is 18.98 kg/m. Nutrition Status: Nutrition Problem: Moderate Malnutrition Etiology: chronic illness Signs/Symptoms: moderate fat depletion, severe muscle depletion, mild muscle depletion Interventions:  TPN --TPN discontinued 1/3, diet advance --Dietitian  following, continue supplementation, patient requires feeding assistance, continue to encourage increased oral intake --Marinol 2.5 mg p.o. twice daily  Weakness/deconditioning/debility: --PT/OT currently recommending SNF placement; pending bed offers --Continue therapy efforts while inpatient   DVT prophylaxis: enoxaparin (LOVENOX) injection 30 mg Start: 03/07/21 0800 SCD's Start: 03/06/21 0147   Code Status: Full Code Family Communication: updated patient's son, Dr. Harlow Asa this morning.    Disposition Plan:  Level of care: Med-Surg Status is: Inpatient   Procedures:  Exploratory laparotomy with Phillip Heal patch, Dr. Marlou Starks 12/16  Antimicrobials:  Zosyn 12/16 - 12/23    Subjective: Patient seen examined at bedside, resting comfortably.  Lying in bed.  Patient had a entire Glucerna shake yesterday. Reports not hungry this morning.  No other questions or concerns at this time.  Denies headache, no chest pain, no shortness of breath, no abdominal pain.  Nursing reported fluctuating heart rate overnight, patient asymptomatic.  Otherwise no other acute events overnight reported by nursing staff.  Updated patient's son, Dr. Harlow Asa this morning.  Objective: Vitals:   03/27/21 0531 03/27/21 1235 03/27/21 2255 03/28/21 0503  BP: (!) 167/57 (!) 132/52 (!) 128/41 (!) 145/109  Pulse: 60 63 (!) 40 (!) 52  Resp: 14 14 18 16   Temp: 97.7 F (36.5 C) (!) 97.3 F (36.3 C) 98.3 F (36.8 C) 98.1 F (36.7 C)  TempSrc: Oral  Oral   SpO2: 96% 98% 96% 96%  Weight: 45.4 kg   48.6 kg  Height:        Intake/Output Summary (Last 24 hours) at 03/28/2021 1017 Last data filed at 03/28/2021 0825 Gross per 24 hour  Intake 360 ml  Output 500 ml  Net -140 ml   Filed Weights   03/26/21 0522 03/27/21 0531 03/28/21 0503  Weight: 45.3 kg 45.4 kg 48.6 kg    Examination:  General exam: Appears calm and comfortable, frail/elderly in  appearance Respiratory system: Clear to auscultation. Respiratory effort normal.  On room air Cardiovascular system: S1 & S2 heard, RRR. No JVD, murmurs, rubs, gallops or clicks. No pedal edema. Gastrointestinal system: Abdomen is nondistended, soft and nontender. No organomegaly or masses felt. Normal bowel sounds heard.  Abdominal dressing noted in place, clean/dry/intact. GU: Foley catheter noted with clear yellow urine in collection bag Central nervous system: Alert and oriented. No focal neurological deficits. Extremities: Symmetric 5 x 5 power. Skin: No rashes, lesions or ulcers Psychiatry: Judgement and insight appear normal. Mood & affect appropriate.     Data Reviewed: I have personally reviewed following labs and imaging studies  CBC: Recent Labs  Lab 03/22/21 0634 03/27/21 0231  WBC 8.2 6.8  NEUTROABS 5.9  --   HGB 7.3* 7.2*  HCT 23.3* 22.9*  MCV 100.0 101.8*  PLT 218 426   Basic Metabolic Panel: Recent Labs  Lab 03/22/21 0634 03/23/21 0309 03/24/21 0334 03/25/21 0343 03/27/21 0231 03/27/21 0749  NA 140 142 144 143 140  --   K 4.2 3.9 4.0 3.7 3.1*  --   CL 110 112* 114* 114* 113*  --   CO2 25 26 23 23 22   --   GLUCOSE 118* 124* 88 79 106*  --   BUN 59* 59* 51* 43* 33*  --   CREATININE 1.34* 1.41* 1.30* 1.51* 1.47*  --   CALCIUM 8.5* 8.5* 8.4* 8.2* 8.0*  --   MG 2.0 2.0  --   --   --  2.1  PHOS 3.5 3.5  --   --   --   --  GFR: Estimated Creatinine Clearance: 20.7 mL/min (A) (by C-G formula based on SCr of 1.47 mg/dL (H)). Liver Function Tests: Recent Labs  Lab 03/22/21 0634 03/24/21 0334  AST 32 30  ALT 87* 62*  ALKPHOS 78 80  BILITOT 0.5 0.7  PROT 5.2* 5.4*  ALBUMIN 2.9* 3.0*   No results for input(s): LIPASE, AMYLASE in the last 168 hours.  No results for input(s): AMMONIA in the last 168 hours. Coagulation Profile: No results for input(s): INR, PROTIME in the last 168 hours. Cardiac Enzymes: No results for input(s): CKTOTAL, CKMB,  CKMBINDEX, TROPONINI in the last 168 hours. BNP (last 3 results) No results for input(s): PROBNP in the last 8760 hours. HbA1C: No results for input(s): HGBA1C in the last 72 hours. CBG: Recent Labs  Lab 03/27/21 0527 03/27/21 1651 03/27/21 1943 03/27/21 2314 03/28/21 0509  GLUCAP 108* 121* 88 83 94   Lipid Profile: No results for input(s): CHOL, HDL, LDLCALC, TRIG, CHOLHDL, LDLDIRECT in the last 72 hours.  Thyroid Function Tests: No results for input(s): TSH, T4TOTAL, FREET4, T3FREE, THYROIDAB in the last 72 hours. Anemia Panel: No results for input(s): VITAMINB12, FOLATE, FERRITIN, TIBC, IRON, RETICCTPCT in the last 72 hours. Sepsis Labs: No results for input(s): PROCALCITON, LATICACIDVEN in the last 168 hours.  No results found for this or any previous visit (from the past 240 hour(s)).       Radiology Studies: No results found.      Scheduled Meds:  acetaminophen  1,000 mg Oral TID   amLODipine  5 mg Oral Daily   bethanechol  5 mg Oral TID   chlorhexidine  15 mL Mouth Rinse BID   Chlorhexidine Gluconate Cloth  6 each Topical Daily   cholecalciferol  2,000 Units Oral Daily   dronabinol  2.5 mg Oral BID AC   enoxaparin (LOVENOX) injection  30 mg Subcutaneous Q24H   feeding supplement (GLUCERNA SHAKE)  237 mL Oral TID BM   hydrALAZINE  50 mg Oral Q8H   insulin aspart  0-9 Units Subcutaneous Q6H   mouth rinse  15 mL Mouth Rinse q12n4p   melatonin  3 mg Oral QHS   pantoprazole  40 mg Oral BID   polycarbophil  625 mg Oral BID   rosuvastatin  10 mg Oral Daily   sodium chloride flush  10-40 mL Intracatheter Q12H   Continuous Infusions:   LOS: 22 days    Time spent: 36 minutes spent on chart review, discussion with nursing staff, consultants, updating family and interview/physical exam; more than 50% of that time was spent in counseling and/or coordination of care.    Darlin Stenseth J [place] DO [facility] Available via Epic secure chat 7am-7pm After  these hours, please refer to coverage provider listed on [email or web address] 03/28/2021, 10:17 AM

## 2021-03-28 NOTE — Progress Notes (Signed)
23 Days Post-Op   Subjective/Chief Complaint: Patient just finished a session with PT Sitting in bedside chair - no new complaints Still with minimal PO intake yesterday - no nausea or vomiting, just no appetite On Marinol  Voiding well without difficulty Objective: Vital signs in last 24 hours: Temp:  [97.3 F (36.3 C)-98.3 F (36.8 C)] 98.1 F (36.7 C) (01/08 0503) Pulse Rate:  [40-63] 52 (01/08 0503) Resp:  [14-18] 16 (01/08 0503) BP: (128-145)/(41-109) 145/109 (01/08 0503) SpO2:  [96 %-98 %] 96 % (01/08 0503) Weight:  [48.6 kg] 48.6 kg (01/08 0503) Last BM Date: 03/25/21  Intake/Output from previous day: 01/07 0701 - 01/08 0700 In: 360 [P.O.:360] Out: 0  Intake/Output this shift: Total I/O In: -  Out: 500 [Urine:500]  General: resting comfortably, NAD Psych: alert and oriented Resp: normal work of breathing on room air Abdomen: soft, nondistended, nontender to palpation. Midline wound clean with granulation tissue and some fibrinous exudate Lab Results:  Recent Labs    03/27/21 0231  WBC 6.8  HGB 7.2*  HCT 22.9*  PLT 191   BMET Recent Labs    03/27/21 0231  NA 140  K 3.1*  CL 113*  CO2 22  GLUCOSE 106*  BUN 33*  CREATININE 1.47*  CALCIUM 8.0*   PT/INR No results for input(s): LABPROT, INR in the last 72 hours. ABG No results for input(s): PHART, HCO3 in the last 72 hours.  Invalid input(s): PCO2, PO2  Studies/Results: No results found.  Anti-infectives: Anti-infectives (From admission, onward)    Start     Dose/Rate Route Frequency Ordered Stop   03/06/21 0400  piperacillin-tazobactam (ZOSYN) IVPB 2.25 g        2.25 g 100 mL/hr over 30 Minutes Intravenous Every 6 hours 03/06/21 0216 03/12/21 1707   03/05/21 2115  piperacillin-tazobactam (ZOSYN) IVPB 3.375 g        3.375 g 100 mL/hr over 30 Minutes Intravenous  Once 03/05/21 2101 03/05/21 2203       Assessment/Plan: s/p Procedure(s): EXPLORATORY LAPAROTOMY and graham patch repair  of perforated duodenal ulcer (N/A) Advance diet. Encourage po's PT/OT Perforated duodenal ulcer POD 23, S/P exploratory laparotomy with Phillip Heal patch repair 03/05/21 Dr. Marlou Starks - UGI without leak  - soft diet as tolerated, glucerna TID; not eating much - on Marinol - continue to mobilize with PT/OT - off IV abx - BID PPI (oral) - H pylori is negative    FEN: soft diet as tolerated. glucerna TID VTE: LMWH Foley: replaced 1/3 for retention, urecholine; out on 1/6 and voiding well ID: off abx; WBC normal   Below per medical service HTN: On oral norvasc and hydralazine, appreciate TRH assistance HLD CKD stage IV - Cr stable  Carotid stenosis  Chronic anemia - hgb stable  Moderate protein calorie malnutrition - encourage PO intake Physical deconditioning  Urinary retention- replacement of foley x2.   Dispo: awaiting SNF placement, overall stable.  Cont to encourage oral intake     LOS: 22 days    Maia Petties 03/28/2021

## 2021-03-28 NOTE — Progress Notes (Signed)
Occupational Therapy Treatment Patient Details Name: Kathleen Jimenez MRN: [id number] DOB: 1931/04/30 Today's Date: 03/28/2021   History of present illness 88 yo female  s/p exploratory laparotomy for perforated duodenal ulcer on 12/16. PMH : IM nail, lumbar fusion, CKD   OT comments  Patient was approached at agreed upon time from 1/7 with son encouragement to participate in session patient agreed. Patient was able to participate in transfer from L side of bed to recliner on R side of bed with RW and one rest break with increased encouragement and min A. Patient participated in shampoo cap and hair stylling seated with no back support in recliner with min A to complete task with education on trying tasks prior to asking for others to complete. Patient would continue to benefit from skilled OT services at this time while admitted and after d/c to address noted deficits in order to improve overall safety and independence in ADLs.     Recommendations for follow up therapy are one component of a multi-disciplinary discharge planning process, led by the attending physician.  Recommendations may be updated based on patient status, additional functional criteria and insurance authorization.    Follow Up Recommendations  Skilled nursing-short term rehab (<3 hours/day)    Assistance Recommended at Discharge Frequent or constant Supervision/Assistance  Patient can return home with the following  A lot of help with bathing/dressing/bathroom;Two people to help with walking and/or transfers   Equipment Recommendations  Other (comment) (defer to next venue)    Recommendations for Other Services      Precautions / Restrictions Precautions Precautions: Fall Precaution Comments: incontinent of urine Restrictions Weight Bearing Restrictions: No       Mobility Bed Mobility Overal bed mobility: Needs Assistance       Supine to sit: Min assist;HOB elevated     General bed mobility comments: with  increased time    Transfers                         Balance                                           ADL either performed or assessed with clinical judgement   ADL Overall ADL's : Needs assistance/impaired     Grooming: Wash/dry face;Set up;Sitting;Brushing hair;Minimal assistance Grooming Details (indicate cue type and reason): patietn was min a for completing hair combing with noted to have a few knots in it. paient was able to participate in shampoo cap sittng in recliner with no back support with increased encouragement to participate in task.               Lower Body Dressing Details (indicate cue type and reason): patient while sitting on edge of bed reached for socks to pull up but declined to don/doff them during session/ " i did that the other day"   Toilet Transfer Details (indicate cue type and reason): patient transfered from edge of bed to recliner on other side of  room with min guard and IV pole management. patient needed to take a small rest break half way with HR noted to range from 48 bpm to 70 bpm during session.           General ADL Comments: patient declined to participate in bathing tasks.    Extremity/Trunk Assessment  Vision       Perception     Praxis      Cognition Arousal/Alertness: Awake/alert Behavior During Therapy: Flat affect Overall Cognitive Status: No family/caregiver present to determine baseline cognitive functioning                                 General Comments: patient needed encouragement to get session started from family but Belva Bertin was cooperative with encoragement needed to participate in all tasks to full ability prior to asking for help          Exercises     Shoulder Instructions       General Comments      Pertinent Vitals/ Pain       Pain Assessment: Faces Faces Pain Scale: Hurts a little bit Pain Location: abdomen Pain Descriptors /  Indicators: Grimacing Pain Intervention(s): Monitored during session;Limited activity within patient's tolerance  Home Living                                          Prior Functioning/Environment              Frequency  Min 2X/week        Progress Toward Goals  OT Goals(current goals can now be found in the care plan section)  Progress towards OT goals: Progressing toward goals     Plan Discharge plan remains appropriate    Co-evaluation                 AM-PAC OT "6 Clicks" Daily Activity     Outcome Measure   Help from another person eating meals?: A Lot Help from another person taking care of personal grooming?: A Lot Help from another person toileting, which includes using toliet, bedpan, or urinal?: Total Help from another person bathing (including washing, rinsing, drying)?: Total Help from another person to put on and taking off regular upper body clothing?: Total Help from another person to put on and taking off regular lower body clothing?: Total 6 Click Score: 8    End of Session Equipment Utilized During Treatment: Rolling walker (2 wheels);Gait belt  OT Visit Diagnosis: Unsteadiness on feet (R26.81);Other symptoms and signs involving cognitive function;History of falling (Z91.81);Muscle weakness (generalized) (M62.81);Pain   Activity Tolerance Patient limited by fatigue   Patient Left in bed;with call bell/phone within reach;with bed alarm set;with nursing/sitter in room   Nurse Communication Mobility status        Time: 8115-7262 OT Time Calculation (min): 32 min  Charges: OT General Charges $OT Visit: 1 Visit OT Treatments $Self Care/Home Management : 23-37 mins  Jackelyn Poling OTR/L, MS Acute Rehabilitation Department Office# [phone] Pager# [phone]   Marcellina Millin 03/28/2021, 12:36 PM

## 2021-03-29 LAB — RESP PANEL BY RT-PCR (FLU A&B, COVID) ARPGX2
Influenza A by PCR: NEGATIVE
Influenza B by PCR: NEGATIVE
SARS Coronavirus 2 by RT PCR: NEGATIVE

## 2021-03-29 MED ORDER — BETHANECHOL CHLORIDE 10 MG PO TABS
5.0000 mg | ORAL_TABLET | Freq: Two times a day (BID) | ORAL | Status: DC
  Administered 2021-03-29 – 2021-03-30 (×2): 5 mg via ORAL
  Filled 2021-03-29: qty 0.5
  Filled 2021-03-29: qty 1

## 2021-03-29 MED ORDER — ADULT MULTIVITAMIN W/MINERALS CH
1.0000 | ORAL_TABLET | Freq: Every day | ORAL | Status: DC
  Administered 2021-03-29 – 2021-03-30 (×2): 1 via ORAL
  Filled 2021-03-29 (×2): qty 1

## 2021-03-29 NOTE — Progress Notes (Addendum)
Nutrition Follow-up  DOCUMENTATION CODES:   Non-severe (moderate) malnutrition in context of chronic illness  INTERVENTION:   -Glucerna Shake po TID, each supplement provides 220 kcal and 10 grams of protein  -Multivitamin with minerals daily  -Encouraged PO intakes  NUTRITION DIAGNOSIS:   Moderate Malnutrition related to chronic illness as evidenced by moderate fat depletion, severe muscle depletion, mild muscle depletion.  Ongoing.  GOAL:   Patient will meet greater than or equal to 90% of their needs  Progressing.  MONITOR:   Diet advancement, Labs, Weight trends, Skin, Other (Comment) (TPN regimen)  ASSESSMENT:   88 year-old female with medical history of HTN, anemia, CKD, and L carotid stenosis. She presented to the ED due to abdominal pain which began the day PTA and reported sour taste in her mouth for several weeks. In the ED she had a CT which was consistent with free air and bowel perforation.  12/17: s/p ex lap with graham patch repair d/t perforated duodenal ulcer 12/20: PICC placed, TPN initiated 12/21: bari-CLD 12/23: FLD 12/27: Dysphagia 3 diet 1/3: TPN stopped  Patient ate 25% of her breakfast tray. Has lunch tray sitting in room at time of visit, has cottage cheese, chicken noodle soup and ginger ale on tray. Prior to today, pt was not eating much of her meal trays. Pt requires feeding assistance. Reports she is drinking her Glucernas.  Admission weight: 106 lbs. Current weight: 98 lbs  Medications: Vitamin D, Fibercon  Labs reviewed:  CBGs: 83-119  Diet Order:   Diet Order             DIET DYS 3 Room service appropriate? Yes; Fluid consistency: Thin  Diet effective now                   EDUCATION NEEDS:   Not appropriate for education at this time  Skin:  Skin Assessment: Skin Integrity Issues: Skin Integrity Issues:: Stage I, Incisions Stage I: sacrum Incisions: abdomen (12/17)  Last BM:  1/9  Height:   Ht Readings from  Last 1 Encounters:  03/06/21 5\' 3"  (1.6 m)    Weight:   Wt Readings from Last 1 Encounters:  03/29/21 44.6 kg    Ideal Body Weight:  52.3 kg  BMI:  Body mass index is 17.42 kg/m.  Estimated Nutritional Needs:   Kcal:  1500-1700 kcal  Protein:  75-90 grams  Fluid:  >/= 1.7 L/day  Clayton Bibles, MS, RD, LDN Inpatient Clinical Dietitian Contact information available via Amion

## 2021-03-29 NOTE — Discharge Summary (Signed)
[facility] Discharge Summary   Patient ID: Kathleen Jimenez MRN: [id number] DOB/AGE: 86/19/1933 86 y.o.  Admit date: 03/05/2021 Discharge date: 03/30/2021  Admitting Diagnosis: Perforated duodenal ulcer   Discharge Diagnosis Patient Active Problem List   Diagnosis Date Noted   Acute urinary retention 03/23/2021   Wrist pain, acute, right 03/23/2021   Agitation 03/23/2021   Transaminitis    Hyperlipidemia    Acute renal failure superimposed on stage 4 chronic kidney disease ([place])    Malnutrition of moderate degree 03/10/2021   Duodenal ulcer disease 03/06/2021   Acute respiratory failure ([place])    Perforated abdominal viscus    Carotid stenosis 05/19/2017   UTI due to Klebsiella species 12/30/2016   Chronic kidney disease    Closed intertrochanteric fracture of left hip ([place]) 12/27/2016   Acute blood loss anemia 12/27/2016   Hip pain, acute, left 12/26/2016   ARF (acute renal failure) ([place]) 12/26/2016   Essential hypertension 12/26/2016   Postoperative anemia due to acute blood loss 12/26/2016   Dehydration 12/26/2016    Consultants Internal medicine  Critical care  Imaging: No results found.  Procedures Dr. Autumn Messing III (03/06/21) - Exploratory laparotomy and graham patch repair of perforated duodenal ulcer  Hospital Course:  Patient is an 88 year old female who presented to the ED with abdominal pain.  Workup showed pneumoperitoneum.  Patient was admitted and underwent procedure listed above.  Tolerated procedure well and was transferred to the ICU. Critical care consulted for assistance in medical management, this transitioned to internal medicine once patient was transferred from ICU. UGI on POD4 was negative for leak. Diet was advanced as tolerated.  On POD25, the patient was voiding well, tolerating diet, ambulating well, pain well controlled, vital signs stable, wound stable and felt stable for discharge to SNF.  Patient will follow up in our office in  3-4 weeks and knows to call with questions or concerns.    Hospitalization was complicated by urinary retention requiring foley catheter placement and some delirium. Foley was able to be removed and patient was voiding spontaneously prior to discharge. Psychoactive medications that may contribute to delirium were limited.   Physical Exam: General: resting comfortably, NAD Psych: alert and oriented Resp: normal work of breathing on room air Abdomen: soft, nondistended, nontender to palpation. Midline wound clean with granulation tissue and some fibrinous exudate   Allergies as of 03/30/2021   No Known Allergies      Medication List     STOP taking these medications    carvedilol 6.25 MG tablet Commonly known as: COREG       TAKE these medications    acetaminophen 500 MG tablet Commonly known as: TYLENOL Take 2 tablets (1,000 mg total) by mouth every 8 (eight) hours as needed for moderate pain. What changed:  how much to take when to take this   amLODipine 5 MG tablet Commonly known as: NORVASC Take 1 tablet (5 mg total) by mouth daily. What changed: Another medication with the same name was removed. Continue taking this medication, and follow the directions you see here.   aspirin EC 81 MG tablet Take 81 mg by mouth daily. Swallow whole.   bethanechol 5 MG tablet Commonly known as: URECHOLINE Take 1 tablet (5 mg total) by mouth 2 (two) times daily for 2 days, THEN 1 tablet (5 mg total) daily for 2 days. Start taking on: March 30, 2021   calcium carbonate 500 MG chewable tablet Commonly known as: TUMS -  dosed in mg elemental calcium Chew 500 mg by mouth daily.   feeding supplement (GLUCERNA SHAKE) Liqd Take 237 mLs by mouth 3 (three) times daily between meals.   hydrALAZINE 50 MG tablet Commonly known as: APRESOLINE Take 1 tablet (50 mg total) by mouth every 8 (eight) hours. What changed:  medication strength how much to take   multivitamin with minerals  Tabs tablet Take 1 tablet by mouth daily. Centrum   pantoprazole 40 MG tablet Commonly known as: PROTONIX Take 1 tablet (40 mg total) by mouth 2 (two) times daily.   polycarbophil 625 MG tablet Commonly known as: FIBERCON Take 1 tablet (625 mg total) by mouth 2 (two) times daily.   rosuvastatin 10 MG tablet Commonly known as: CRESTOR Take 10 mg by mouth daily.   Vitamin D 50 MCG (2000 UT) tablet Take 2,000 Units by mouth daily.          Contact information for follow-up providers     Jovita Kussmaul, MD. Go on 04/20/2021.   Specialty: General Surgery Why: 11 AM. Please arrive 30 min prior to appointment time for check in. Bring photo ID and insurance information with you. Contact information: [address] [place] [address] [phone]              Contact information for after-discharge care     Destination     [facility] .   Service: Skilled Nursing Contact information: [address] [place] [place] [phone]                     Signed: Norm Parcel , [facility] 03/30/2021, 9:20 AM Please see Amion for pager number during day hours 7:00am-4:30pm

## 2021-03-29 NOTE — Progress Notes (Signed)
Progress Note  24 Days Post-Op  Subjective: Pt reports feeling tired this AM. She denies significant abdominal pain. She denies n/v. She reports she had a BM this AM. Urination is improving. She reports eating some scrambled eggs and grits this morning.   Objective: Vital signs in last 24 hours: Temp:  [97.6 F (36.4 C)-98.8 F (37.1 C)] 97.6 F (36.4 C) (01/09 1328) Pulse Rate:  [61-68] 61 (01/09 1328) Resp:  [16-18] 18 (01/09 1328) BP: (92-176)/(40-85) 149/58 (01/09 1328) SpO2:  [96 %-97 %] 96 % (01/09 1328) Weight:  [44.6 kg] 44.6 kg (01/09 0527) Last BM Date: 03/29/21  Intake/Output from previous day: 01/08 0701 - 01/09 0700 In: 330 [P.O.:330] Out: 700 [Urine:700] Intake/Output this shift: Total I/O In: 120 [P.O.:120] Out: -   PE: General: resting comfortably, NAD Psych: alert and oriented Resp: normal work of breathing on room air Abdomen: soft, nondistended, nontender to palpation. Midline wound clean with granulation tissue and some fibrinous exudate   Lab Results:  Recent Labs    03/27/21 0231  WBC 6.8  HGB 7.2*  HCT 22.9*  PLT 191   BMET Recent Labs    03/27/21 0231  NA 140  K 3.1*  CL 113*  CO2 22  GLUCOSE 106*  BUN 33*  CREATININE 1.47*  CALCIUM 8.0*   PT/INR No results for input(s): LABPROT, INR in the last 72 hours. CMP     Component Value Date/Time   NA 140 03/27/2021 0231   K 3.1 (L) 03/27/2021 0231   CL 113 (H) 03/27/2021 0231   CO2 22 03/27/2021 0231   GLUCOSE 106 (H) 03/27/2021 0231   BUN 33 (H) 03/27/2021 0231   CREATININE 1.47 (H) 03/27/2021 0231   CALCIUM 8.0 (L) 03/27/2021 0231   PROT 5.4 (L) 03/24/2021 0334   ALBUMIN 3.0 (L) 03/24/2021 0334   AST 30 03/24/2021 0334   ALT 62 (H) 03/24/2021 0334   ALKPHOS 80 03/24/2021 0334   BILITOT 0.7 03/24/2021 0334   GFRNONAA 34 (L) 03/27/2021 0231   GFRAA 26 (L) 05/20/2017 0433   Lipase     Component Value Date/Time   LIPASE 108 (H) 03/05/2021 1949        Studies/Results: No results found.  Anti-infectives: Anti-infectives (From admission, onward)    Start     Dose/Rate Route Frequency Ordered Stop   03/06/21 0400  piperacillin-tazobactam (ZOSYN) IVPB 2.25 g        2.25 g 100 mL/hr over 30 Minutes Intravenous Every 6 hours 03/06/21 0216 03/12/21 1707   03/05/21 2115  piperacillin-tazobactam (ZOSYN) IVPB 3.375 g        3.375 g 100 mL/hr over 30 Minutes Intravenous  Once 03/05/21 2101 03/05/21 2203        Assessment/Plan Perforated duodenal ulcer POD 24, S/P exploratory laparotomy with Phillip Heal patch repair 03/05/21 Dr. Marlou Starks - UGI without leak  - soft diet as tolerated, glucerna TID; not eating much - marinol stopped 1/8 per family request (delirium) - continue to mobilize with PT/OT - off IV abx - BID PPI (oral) - H pylori is negative    FEN: soft diet as tolerated. glucerna TID VTE: LMWH Foley: replaced 1/3 for retention, out on 1/6 and voiding well  ID: off abx; WBC normal   Below per medical service HTN: On oral norvasc and hydralazine, appreciate TRH assistance HLD CKD stage IV - Cr stable  Carotid stenosis  Chronic anemia - hgb stable  Moderate protein calorie malnutrition - encourage PO intake Physical  deconditioning  Urinary retention- improving, wean urecholine    Dispo: planning SNF placement tomorrow   LOS: 23 days   I reviewed nursing notes, hospitalist notes, last 24 h vitals and pain scores, and last 48 h intake and output.  This care required low level of medical decision making.    Norm Parcel, [facility] 03/29/2021, 1:44 PM Please see Amion for pager number during day hours 7:00am-4:30pm

## 2021-03-29 NOTE — Progress Notes (Signed)
Physical Therapy Treatment Patient Details Name: Kathleen Jimenez MRN: [id number] DOB: 1931-05-11 Today's Date: 03/29/2021   History of Present Illness 88 yo female  s/p exploratory laparotomy for perforated duodenal ulcer on 12/16. PMH : IM nail, lumbar fusion, CKD    PT Comments    Pt assisted with mobility.  Pt able to tolerate short distance ambulation with seated rest break.  Pt did require a little encouragement however agreeable to try with requests.  Continue to recommend SNF upon d/c.    Recommendations for follow up therapy are one component of a multi-disciplinary discharge planning process, led by the attending physician.  Recommendations may be updated based on patient status, additional functional criteria and insurance authorization.  Follow Up Recommendations  Skilled nursing-short term rehab (<3 hours/day)     Assistance Recommended at Discharge Frequent or constant Supervision/Assistance  Patient can return home with the following A little help with walking and/or transfers;A lot of help with bathing/dressing/bathroom   Equipment Recommendations  None recommended by PT    Recommendations for Other Services       Precautions / Restrictions Precautions Precautions: Fall Precaution Comments: incontinent of urine     Mobility  Bed Mobility Overal bed mobility: Needs Assistance Bed Mobility: Rolling;Sidelying to Sit Rolling: Min guard Sidelying to sit: Mod assist       General bed mobility comments: increased time, provided HHA for pt to attempt self assisting, mod assist for trunk upright    Transfers Overall transfer level: Needs assistance Equipment used: Rolling walker (2 wheels) Transfers: Sit to/from Stand Sit to Stand: Min assist;+2 safety/equipment           General transfer comment: assist to rise and steady, cues for hand placement    Ambulation/Gait Ambulation/Gait assistance: Min assist;+2 safety/equipment Gait Distance (Feet): 7 Feet  (x2) Assistive device: Rolling walker (2 wheels) Gait Pattern/deviations: Step-to pattern;Decreased step length - right;Decreased step length - left;Narrow base of support;Trunk flexed Gait velocity: decr     General Gait Details: encouragement to ambulate so provided goal ('to the door", "to this square"), pt tolerated short distances well, requested recliner so took seated rest break, performed 7'x2   Stairs             Wheelchair Mobility    Modified Rankin (Stroke Patients Only)       Balance Overall balance assessment: Needs assistance         Standing balance support: Reliant on assistive device for balance;During functional activity;Bilateral upper extremity supported Standing balance-Leahy Scale: Poor                              Cognition Arousal/Alertness: Awake/alert Behavior During Therapy: Flat affect Overall Cognitive Status: No family/caregiver present to determine baseline cognitive functioning                                 General Comments: pt agreeable to mobilize however self limiting, able to continue with encouragement to perform full abilities        Exercises      General Comments        Pertinent Vitals/Pain Pain Assessment: Faces Faces Pain Scale: No hurt Pain Intervention(s): Monitored during session    Home Living                          Prior  Function            PT Goals (current goals can now be found in the care plan section) Acute Rehab PT Goals PT Goal Formulation: With patient Time For Goal Achievement: 04/05/21 Potential to Achieve Goals: Fair Progress towards PT goals: Progressing toward goals    Frequency    Min 2X/week      PT Plan Current plan remains appropriate    Co-evaluation              AM-PAC PT "6 Clicks" Mobility   Outcome Measure  Help needed turning from your back to your side while in a flat bed without using bedrails?: A Little Help  needed moving from lying on your back to sitting on the side of a flat bed without using bedrails?: A Little Help needed moving to and from a bed to a chair (including a wheelchair)?: A Lot Help needed standing up from a chair using your arms (e.g., wheelchair or bedside chair)?: A Lot Help needed to walk in hospital room?: A Lot Help needed climbing 3-5 steps with a railing? : Total 6 Click Score: 13    End of Session Equipment Utilized During Treatment: Gait belt Activity Tolerance: Patient limited by fatigue Patient left: in chair;with call bell/phone within reach;with chair alarm set Nurse Communication: Mobility status PT Visit Diagnosis: Muscle weakness (generalized) (M62.81);Difficulty in walking, not elsewhere classified (R26.2)     Time: 8144-8185 PT Time Calculation (min) (ACUTE ONLY): 13 min  Charges:  $Gait Training: 8-22 mins                    Jannette Spanner PT, DPT Acute Rehabilitation Services Pager: [phone] Office: [place] 03/29/2021, 4:25 PM

## 2021-03-29 NOTE — Progress Notes (Signed)
Consultation PROGRESS NOTE    Kathleen Jimenez  PJA:[id number] DOB: 02/17/1932 DOA: 03/05/2021 PCP: Ginger Organ., MD    Brief Narrative:  Kathleen Jimenez is an 88 year old female with past medical history significant for essential hypertension, hyperlipidemia, CKD stage IV who presented to [facility] ED on 12/16 with 1 day history of abdominal pain.  Evaluation in the ED notable for perforated viscus and was taken urgently to the operating room.  Hospitalist service consulted for further evaluation and management of CKD, hypertension and dyslipidemia.   Assessment & Plan:   Principal Problem:   Perforated abdominal viscus Active Problems:   Essential hypertension   Postoperative anemia due to acute blood loss   Carotid stenosis   Duodenal ulcer disease   Acute respiratory failure (HCC)   Malnutrition of moderate degree   Transaminitis   Hyperlipidemia   Acute renal failure superimposed on stage 4 chronic kidney disease (HCC)   Acute urinary retention   Wrist pain, acute, right   Agitation   Perforated abdominal viscus Duodenal ulcer. Patient initially presented to the ED with 1 day history of abdominal pain.  CT chest/abdomen/pelvis with moderate free air and mild-moderate free fluid in the abdomen consistent with perforated hollow viscus.  Patient underwent exploratory laparotomy with Phillip Heal patch repair of perforated duodenal ulcer by general surgery, Dr. Marlou Starks on 03/05/2021.  Upper GI series on 12/21 with mild irregularity proximal duodenum without visible leak.  H. pylori negative. --TPN discontinued 1/3 --Diet advanced to dysphagia 3 diet; continue to encourage increased oral intake --Further per primary, general surgery  Right thyroid mass On CT chest/abdomen/pelvis, incidental finding of 5.2 cm right thyroid heterogeneous mass with patchy calcifications and displacement of the trachea to the left.  Per family, has been present for roughly 45 years and no further work-up  desired at this time.  Transaminitis improving. --AST 46>>>231>>>30 --ALT 50>>264>>>62 --Tbili in normal limits --Holding home Crestor; TPN now discontinued  Acute urinary retention Foley catheter placed for elevated retained urine in bladder on 1/3. --Foley catheter now discontinued 1/6 --Bethanechol 5 mg p.o. 3 times daily --Encourage increased mobilization --Continue monitor urinary output; bladder scan as needed  Essential hypertension Home regimen includes hydralazine 25mg  PO twice daily, carvedilol 6.25 mg PO twice daily, Amlodipine 5 mg p.o. daily. --Hydralazine 50 mg PO TID --Amlodipine 5 mg p.o. daily --Continue monitor BP and adjust as needed  Postoperative anemia secondary to acute blood loss Anemia of chronic medical disease Baseline hemoglobin 9, on presentation hemoglobin 9.6's and postoperatively dropped down to 6.8; status post 1 unit pRBCs. --Continue monitor CBC twice weekly; hemoglobin 7.2 03/27/2021 --Transfuse for hemoglobin less than 7.0  Hyperlipidemia --Holding home Crestor due to elevated LFTs  Acute renal failure on CKD stage IV Baseline creatinine 1.7.  Creatinine initially trended up to a peak of 2.49. --Creatinine 1.47 on 1/7 --Avoid nephrotoxins, renal dose all medications  Acute hypoxic respiratory failure, POA: Resolved On presentation to the ED, patient was noted to be 87% on room air.  Likely secondary to perforated viscus as above.  Has been titrated off of supplemental oxygen. --Continue SPO2 checks with vital signs  Agitation: Resolved Would avoid benzodiazepines/Haldol.  Hx carotid artery stenosis --Currently holding home aspirin/statin  Moderate protein calorie malnutrition Body mass index is 17.42 kg/m. Nutrition Status: Nutrition Problem: Moderate Malnutrition Etiology: chronic illness Signs/Symptoms: moderate fat depletion, severe muscle depletion, mild muscle depletion Interventions: TPN --TPN discontinued 1/3, diet  advanced --Dietitian following, continue supplementation, patient requires feeding assistance,  continue to encourage increased oral intake --Discontinue Marinol for possible side effects  Weakness/deconditioning/debility: --PT/OT currently recommending SNF placement; pending bed offers --Continue therapy efforts while inpatient   DVT prophylaxis: enoxaparin (LOVENOX) injection 30 mg Start: 03/07/21 0800 SCD's Start: 03/06/21 0147   Code Status: Full Code Family Communication: updated patient's son, Dr. Harlow Asa this morning.    Disposition Plan:  Level of care: Med-Surg Status is: Inpatient   Procedures:  Exploratory laparotomy with Phillip Heal patch, Dr. Marlou Starks 12/16  Antimicrobials:  Zosyn 12/16 - 12/23    Subjective: Patient seen examined at bedside, resting comfortably.  Lying in bed.  RN present.  Asking for some cold water.  No complaints or questions or concerns at this time.  Updated patient's son, Dr. Harlow Asa this am; he reported that she was able to tolerate Glucerna shake and increase oral intake overnight as long as she was assisted.  Discontinued Marinol for possible side effects. Denies headache, no chest pain, no shortness of breath, no abdominal pain.  No acute events overnight per nursing staff.  Objective: Vitals:   03/28/21 1507 03/28/21 2103 03/29/21 0439 03/29/21 0527  BP: (!) 130/40 134/85 (!) 176/48 (!) 156/59  Pulse: 62 67 66 68  Resp:  16 18   Temp: 98.8 F (37.1 C) 98.7 F (37.1 C) 97.7 F (36.5 C)   TempSrc: Oral Oral Oral   SpO2: 97% 96% 96%   Weight:    44.6 kg  Height:        Intake/Output Summary (Last 24 hours) at 03/29/2021 0925 Last data filed at 03/29/2021 0600 Gross per 24 hour  Intake 330 ml  Output 200 ml  Net 130 ml   Filed Weights   03/27/21 0531 03/28/21 0503 03/29/21 0527  Weight: 45.4 kg 48.6 kg 44.6 kg    Examination:  General exam: Appears calm and comfortable, frail/elderly in appearance Respiratory system: Clear to  auscultation. Respiratory effort normal.  On room air Cardiovascular system: S1 & S2 heard, RRR. No JVD, murmurs, rubs, gallops or clicks. No pedal edema. Gastrointestinal system: Abdomen is nondistended, soft and nontender. No organomegaly or masses felt. Normal bowel sounds heard.  Abdominal dressing noted in place, clean/dry/intact. GU: Foley catheter noted with clear yellow urine in collection bag Central nervous system: Alert and oriented. No focal neurological deficits. Extremities: Symmetric 5 x 5 power. Skin: No rashes, lesions or ulcers Psychiatry: Judgement and insight appear normal. Mood & affect appropriate.     Data Reviewed: I have personally reviewed following labs and imaging studies  CBC: Recent Labs  Lab 03/27/21 0231  WBC 6.8  HGB 7.2*  HCT 22.9*  MCV 101.8*  PLT 758   Basic Metabolic Panel: Recent Labs  Lab 03/23/21 0309 03/24/21 0334 03/25/21 0343 03/27/21 0231 03/27/21 0749  NA 142 144 143 140  --   K 3.9 4.0 3.7 3.1*  --   CL 112* 114* 114* 113*  --   CO2 26 23 23 22   --   GLUCOSE 124* 88 79 106*  --   BUN 59* 51* 43* 33*  --   CREATININE 1.41* 1.30* 1.51* 1.47*  --   CALCIUM 8.5* 8.4* 8.2* 8.0*  --   MG 2.0  --   --   --  2.1  PHOS 3.5  --   --   --   --    GFR: Estimated Creatinine Clearance: 19 mL/min (A) (by C-G formula based on SCr of 1.47 mg/dL (H)). Liver Function Tests: Recent Labs  Lab 03/24/21 0334  AST 30  ALT 62*  ALKPHOS 80  BILITOT 0.7  PROT 5.4*  ALBUMIN 3.0*   No results for input(s): LIPASE, AMYLASE in the last 168 hours.  No results for input(s): AMMONIA in the last 168 hours. Coagulation Profile: No results for input(s): INR, PROTIME in the last 168 hours. Cardiac Enzymes: No results for input(s): CKTOTAL, CKMB, CKMBINDEX, TROPONINI in the last 168 hours. BNP (last 3 results) No results for input(s): PROBNP in the last 8760 hours. HbA1C: No results for input(s): HGBA1C in the last 72 hours. CBG: Recent Labs   Lab 03/27/21 1943 03/27/21 2314 03/28/21 0509 03/28/21 1155 03/28/21 1215  GLUCAP 88 83 94 102* 119*   Lipid Profile: No results for input(s): CHOL, HDL, LDLCALC, TRIG, CHOLHDL, LDLDIRECT in the last 72 hours.  Thyroid Function Tests: No results for input(s): TSH, T4TOTAL, FREET4, T3FREE, THYROIDAB in the last 72 hours. Anemia Panel: No results for input(s): VITAMINB12, FOLATE, FERRITIN, TIBC, IRON, RETICCTPCT in the last 72 hours. Sepsis Labs: No results for input(s): PROCALCITON, LATICACIDVEN in the last 168 hours.  No results found for this or any previous visit (from the past 240 hour(s)).       Radiology Studies: No results found.      Scheduled Meds:  acetaminophen  1,000 mg Oral TID   amLODipine  5 mg Oral Daily   bethanechol  5 mg Oral TID   chlorhexidine  15 mL Mouth Rinse BID   Chlorhexidine Gluconate Cloth  6 each Topical Daily   cholecalciferol  2,000 Units Oral Daily   enoxaparin (LOVENOX) injection  30 mg Subcutaneous Q24H   feeding supplement (GLUCERNA SHAKE)  237 mL Oral TID BM   hydrALAZINE  50 mg Oral Q8H   mouth rinse  15 mL Mouth Rinse q12n4p   melatonin  3 mg Oral QHS   pantoprazole  40 mg Oral BID   polycarbophil  625 mg Oral BID   rosuvastatin  10 mg Oral Daily   sodium chloride flush  10-40 mL Intracatheter Q12H   Continuous Infusions:   LOS: 23 days    Time spent: 36 minutes spent on chart review, discussion with nursing staff, consultants, updating family and interview/physical exam; more than 50% of that time was spent in counseling and/or coordination of care.    Kemond Amorin J [place] DO [facility] Available via Epic secure chat 7am-7pm After these hours, please refer to coverage provider listed on [email or web address] 03/29/2021, 9:25 AM

## 2021-03-29 NOTE — TOC Progression Note (Signed)
Transition of Care [facility]) - Progression Note    Patient Details  Name: Kathleen Jimenez MRN: [id number] Date of Birth: 07-23-1931  Transition of Care [facility]) CM/SW Contact  Lennart Pall, LCSW Phone Number: 03/29/2021, 1:17 PM  Clinical Narrative:    Pt medically cleared for dc to SNF and bed has been accepted by pt/ son at [facility] SN[facility] who can admit pt tomorrow.  Have begun insurance authorization and requested COVID test per MD.     Expected Discharge Plan: [place] Barriers to Discharge: SNF Pending bed offer, Insurance Authorization  Expected Discharge Plan and Services Expected Discharge Plan: [place] In-house Referral: Clinical Social Work   Post Acute Care Choice: [place] Living arrangements for the past 2 months: [place]                 DME Arranged: N/A DME Agency: NA                   Social Determinants of Health ([place]) Interventions    Readmission Risk Interventions Readmission Risk Prevention Plan 03/12/2021  Transportation Screening Complete  HRI or Home Care Consult Complete  Social Work Consult for [place] Planning/Counseling Complete  Palliative Care Screening Not Applicable  Medication Review Press photographer) Complete  Some recent data might be hidden

## 2021-03-30 DIAGNOSIS — E785 Hyperlipidemia, unspecified: Secondary | ICD-10-CM | POA: Diagnosis not present

## 2021-03-30 DIAGNOSIS — S31105D Unspecified open wound of abdominal wall, periumbilic region without penetration into peritoneal cavity, subsequent encounter: Secondary | ICD-10-CM | POA: Diagnosis not present

## 2021-03-30 DIAGNOSIS — S51801D Unspecified open wound of right forearm, subsequent encounter: Secondary | ICD-10-CM | POA: Diagnosis not present

## 2021-03-30 DIAGNOSIS — M25531 Pain in right wrist: Secondary | ICD-10-CM | POA: Diagnosis not present

## 2021-03-30 DIAGNOSIS — R198 Other specified symptoms and signs involving the digestive system and abdomen: Secondary | ICD-10-CM | POA: Diagnosis not present

## 2021-03-30 DIAGNOSIS — D62 Acute posthemorrhagic anemia: Secondary | ICD-10-CM | POA: Diagnosis not present

## 2021-03-30 DIAGNOSIS — K261 Acute duodenal ulcer with perforation: Secondary | ICD-10-CM | POA: Diagnosis not present

## 2021-03-30 DIAGNOSIS — R531 Weakness: Secondary | ICD-10-CM | POA: Diagnosis not present

## 2021-03-30 DIAGNOSIS — T8149XA Infection following a procedure, other surgical site, initial encounter: Secondary | ICD-10-CM | POA: Diagnosis not present

## 2021-03-30 DIAGNOSIS — E569 Vitamin deficiency, unspecified: Secondary | ICD-10-CM | POA: Diagnosis not present

## 2021-03-30 DIAGNOSIS — S51801A Unspecified open wound of right forearm, initial encounter: Secondary | ICD-10-CM | POA: Diagnosis not present

## 2021-03-30 DIAGNOSIS — I10 Essential (primary) hypertension: Secondary | ICD-10-CM | POA: Diagnosis not present

## 2021-03-30 DIAGNOSIS — R338 Other retention of urine: Secondary | ICD-10-CM | POA: Diagnosis not present

## 2021-03-30 DIAGNOSIS — N189 Chronic kidney disease, unspecified: Secondary | ICD-10-CM | POA: Diagnosis not present

## 2021-03-30 DIAGNOSIS — R451 Restlessness and agitation: Secondary | ICD-10-CM | POA: Diagnosis not present

## 2021-03-30 DIAGNOSIS — N184 Chronic kidney disease, stage 4 (severe): Secondary | ICD-10-CM | POA: Diagnosis not present

## 2021-03-30 DIAGNOSIS — S31105A Unspecified open wound of abdominal wall, periumbilic region without penetration into peritoneal cavity, initial encounter: Secondary | ICD-10-CM | POA: Diagnosis not present

## 2021-03-30 DIAGNOSIS — J9600 Acute respiratory failure, unspecified whether with hypoxia or hypercapnia: Secondary | ICD-10-CM | POA: Diagnosis not present

## 2021-03-30 DIAGNOSIS — Z48815 Encounter for surgical aftercare following surgery on the digestive system: Secondary | ICD-10-CM | POA: Diagnosis not present

## 2021-03-30 MED ORDER — PANTOPRAZOLE SODIUM 40 MG PO TBEC: 40.0000 mg | DELAYED_RELEASE_TABLET | Freq: Two times a day (BID) | ORAL | 0 refills | Status: AC

## 2021-03-30 MED ORDER — BETHANECHOL CHLORIDE 5 MG PO TABS: ORAL_TABLET | ORAL | 0 refills | Status: AC

## 2021-03-30 MED ORDER — CALCIUM POLYCARBOPHIL 625 MG PO TABS: 625.0000 mg | ORAL_TABLET | Freq: Two times a day (BID) | ORAL | 0 refills | Status: AC

## 2021-03-30 MED ORDER — ACETAMINOPHEN 500 MG PO TABS: 1000.0000 mg | ORAL_TABLET | Freq: Three times a day (TID) | ORAL | 0 refills | Status: AC | PRN

## 2021-03-30 MED ORDER — AMLODIPINE BESYLATE 5 MG PO TABS: 5.0000 mg | ORAL_TABLET | Freq: Every day | ORAL | Status: DC

## 2021-03-30 MED ORDER — HYDRALAZINE HCL 50 MG PO TABS: 50.0000 mg | ORAL_TABLET | Freq: Three times a day (TID) | ORAL | Status: DC

## 2021-03-30 MED ORDER — GLUCERNA SHAKE PO LIQD: 237.0000 mL | Freq: Three times a day (TID) | ORAL | 0 refills | Status: DC

## 2021-03-30 NOTE — TOC Transition Note (Signed)
Transition of Care [facility]) - CM/SW Discharge Note   Patient Details  Name: Kathleen Jimenez MRN: [id number] Date of Birth: 12-Jul-1931  Transition of Care [facility]) CM/SW Contact:  Ross Ludwig, LCSW Phone Number: 03/30/2021, 10:07 AM   Clinical Narrative:     Patient to be d/c'ed today to [facility] room 405.  Patient and family agreeable to plans will transport via ems RN to call report to Lelon Frohlich [phone].  CSW spoke to patient's son Kathleen Jimenez and he is aware that patient is discharging today.     Final next level of care: Skilled Nursing Facility Barriers to Discharge: Barriers Resolved   Patient Goals and CMS Choice Patient states their goals for this hospitalization and ongoing recovery are:: To go to SNF for short term rehab then return back home. CMS Medicare.gov Compare Post Acute Care list provided to:: Patient Choice offered to / list presented to : Patient  Discharge Placement PASRR number recieved: 03/29/21            Patient chooses bed at: [facility] Patient to be transferred to facility by: [facility] Name of family member notified: Son Kathleen Jimenez Patient and family notified of of transfer: 03/30/21  Discharge Plan and Services In-house Referral: Clinical Social Work   Post Acute Care Choice: [place]          DME Arranged: N/A DME Agency: NA                  Social Determinants of Health (SDOH) Interventions     Readmission Risk Interventions Readmission Risk Prevention Plan 03/12/2021  Transportation Screening Complete  HRI or Home Care Consult Complete  Social Work Consult for [place] Planning/Counseling Complete  Palliative Care Screening Not Applicable  Medication Review Press photographer) Complete  Some recent data might be hidden

## 2021-03-30 NOTE — Progress Notes (Signed)
Attempted to call report to [facility] facility at [phone] no one available to take report. Nursing station number left in the packet.

## 2021-04-02 DIAGNOSIS — S51801A Unspecified open wound of right forearm, initial encounter: Secondary | ICD-10-CM | POA: Diagnosis not present

## 2021-04-02 DIAGNOSIS — S31105A Unspecified open wound of abdominal wall, periumbilic region without penetration into peritoneal cavity, initial encounter: Secondary | ICD-10-CM | POA: Diagnosis not present

## 2021-04-02 DIAGNOSIS — T8149XA Infection following a procedure, other surgical site, initial encounter: Secondary | ICD-10-CM | POA: Diagnosis not present

## 2021-04-12 DIAGNOSIS — S31105D Unspecified open wound of abdominal wall, periumbilic region without penetration into peritoneal cavity, subsequent encounter: Secondary | ICD-10-CM | POA: Diagnosis not present

## 2021-04-12 DIAGNOSIS — S51801D Unspecified open wound of right forearm, subsequent encounter: Secondary | ICD-10-CM | POA: Diagnosis not present

## 2021-04-22 DIAGNOSIS — J9602 Acute respiratory failure with hypercapnia: Secondary | ICD-10-CM | POA: Diagnosis not present

## 2021-04-22 DIAGNOSIS — Z48815 Encounter for surgical aftercare following surgery on the digestive system: Secondary | ICD-10-CM | POA: Diagnosis not present

## 2021-04-22 DIAGNOSIS — E785 Hyperlipidemia, unspecified: Secondary | ICD-10-CM | POA: Diagnosis not present

## 2021-04-22 DIAGNOSIS — R339 Retention of urine, unspecified: Secondary | ICD-10-CM | POA: Diagnosis not present

## 2021-04-22 DIAGNOSIS — N189 Chronic kidney disease, unspecified: Secondary | ICD-10-CM | POA: Diagnosis not present

## 2021-04-22 DIAGNOSIS — J9601 Acute respiratory failure with hypoxia: Secondary | ICD-10-CM | POA: Diagnosis not present

## 2021-04-22 DIAGNOSIS — I129 Hypertensive chronic kidney disease with stage 1 through stage 4 chronic kidney disease, or unspecified chronic kidney disease: Secondary | ICD-10-CM | POA: Diagnosis not present

## 2021-05-22 DIAGNOSIS — N189 Chronic kidney disease, unspecified: Secondary | ICD-10-CM | POA: Diagnosis not present

## 2021-05-22 DIAGNOSIS — I129 Hypertensive chronic kidney disease with stage 1 through stage 4 chronic kidney disease, or unspecified chronic kidney disease: Secondary | ICD-10-CM | POA: Diagnosis not present

## 2021-05-22 DIAGNOSIS — E785 Hyperlipidemia, unspecified: Secondary | ICD-10-CM | POA: Diagnosis not present

## 2021-05-22 DIAGNOSIS — J9602 Acute respiratory failure with hypercapnia: Secondary | ICD-10-CM | POA: Diagnosis not present

## 2021-05-22 DIAGNOSIS — Z48815 Encounter for surgical aftercare following surgery on the digestive system: Secondary | ICD-10-CM | POA: Diagnosis not present

## 2021-05-22 DIAGNOSIS — R339 Retention of urine, unspecified: Secondary | ICD-10-CM | POA: Diagnosis not present

## 2021-05-22 DIAGNOSIS — J9601 Acute respiratory failure with hypoxia: Secondary | ICD-10-CM | POA: Diagnosis not present

## 2021-05-24 DIAGNOSIS — M810 Age-related osteoporosis without current pathological fracture: Secondary | ICD-10-CM | POA: Diagnosis not present

## 2021-05-24 DIAGNOSIS — I10 Essential (primary) hypertension: Secondary | ICD-10-CM | POA: Diagnosis not present

## 2021-05-24 DIAGNOSIS — K265 Chronic or unspecified duodenal ulcer with perforation: Secondary | ICD-10-CM | POA: Diagnosis not present

## 2021-05-24 DIAGNOSIS — N189 Chronic kidney disease, unspecified: Secondary | ICD-10-CM | POA: Diagnosis not present

## 2021-05-31 DIAGNOSIS — M6281 Muscle weakness (generalized): Secondary | ICD-10-CM | POA: Diagnosis not present

## 2021-05-31 DIAGNOSIS — L989 Disorder of the skin and subcutaneous tissue, unspecified: Secondary | ICD-10-CM | POA: Diagnosis not present

## 2021-06-02 DIAGNOSIS — M810 Age-related osteoporosis without current pathological fracture: Secondary | ICD-10-CM | POA: Diagnosis not present

## 2021-06-02 DIAGNOSIS — K265 Chronic or unspecified duodenal ulcer with perforation: Secondary | ICD-10-CM | POA: Diagnosis not present

## 2021-06-02 DIAGNOSIS — I10 Essential (primary) hypertension: Secondary | ICD-10-CM | POA: Diagnosis not present

## 2021-06-02 DIAGNOSIS — N189 Chronic kidney disease, unspecified: Secondary | ICD-10-CM | POA: Diagnosis not present

## 2021-06-04 DIAGNOSIS — F4323 Adjustment disorder with mixed anxiety and depressed mood: Secondary | ICD-10-CM | POA: Diagnosis not present

## 2021-06-16 DIAGNOSIS — I129 Hypertensive chronic kidney disease with stage 1 through stage 4 chronic kidney disease, or unspecified chronic kidney disease: Secondary | ICD-10-CM | POA: Diagnosis not present

## 2021-06-16 DIAGNOSIS — R32 Unspecified urinary incontinence: Secondary | ICD-10-CM | POA: Diagnosis not present

## 2021-06-16 DIAGNOSIS — Z7982 Long term (current) use of aspirin: Secondary | ICD-10-CM | POA: Diagnosis not present

## 2021-06-16 DIAGNOSIS — N189 Chronic kidney disease, unspecified: Secondary | ICD-10-CM | POA: Diagnosis not present

## 2021-06-16 DIAGNOSIS — D631 Anemia in chronic kidney disease: Secondary | ICD-10-CM | POA: Diagnosis not present

## 2021-06-18 DIAGNOSIS — M810 Age-related osteoporosis without current pathological fracture: Secondary | ICD-10-CM | POA: Diagnosis not present

## 2021-06-18 DIAGNOSIS — I10 Essential (primary) hypertension: Secondary | ICD-10-CM | POA: Diagnosis not present

## 2021-06-21 DIAGNOSIS — I10 Essential (primary) hypertension: Secondary | ICD-10-CM | POA: Diagnosis not present

## 2021-06-21 DIAGNOSIS — K265 Chronic or unspecified duodenal ulcer with perforation: Secondary | ICD-10-CM | POA: Diagnosis not present

## 2021-06-21 DIAGNOSIS — M6281 Muscle weakness (generalized): Secondary | ICD-10-CM | POA: Diagnosis not present

## 2021-06-21 DIAGNOSIS — N189 Chronic kidney disease, unspecified: Secondary | ICD-10-CM | POA: Diagnosis not present

## 2021-06-23 DIAGNOSIS — D631 Anemia in chronic kidney disease: Secondary | ICD-10-CM | POA: Diagnosis not present

## 2021-06-23 DIAGNOSIS — N189 Chronic kidney disease, unspecified: Secondary | ICD-10-CM | POA: Diagnosis not present

## 2021-06-23 DIAGNOSIS — I129 Hypertensive chronic kidney disease with stage 1 through stage 4 chronic kidney disease, or unspecified chronic kidney disease: Secondary | ICD-10-CM | POA: Diagnosis not present

## 2021-07-05 DIAGNOSIS — I10 Essential (primary) hypertension: Secondary | ICD-10-CM | POA: Diagnosis not present

## 2021-07-05 DIAGNOSIS — D631 Anemia in chronic kidney disease: Secondary | ICD-10-CM | POA: Diagnosis not present

## 2021-07-16 DIAGNOSIS — R32 Unspecified urinary incontinence: Secondary | ICD-10-CM | POA: Diagnosis not present

## 2021-07-16 DIAGNOSIS — N189 Chronic kidney disease, unspecified: Secondary | ICD-10-CM | POA: Diagnosis not present

## 2021-07-16 DIAGNOSIS — D631 Anemia in chronic kidney disease: Secondary | ICD-10-CM | POA: Diagnosis not present

## 2021-07-16 DIAGNOSIS — J9600 Acute respiratory failure, unspecified whether with hypoxia or hypercapnia: Secondary | ICD-10-CM | POA: Diagnosis not present

## 2021-07-16 DIAGNOSIS — Z7982 Long term (current) use of aspirin: Secondary | ICD-10-CM | POA: Diagnosis not present

## 2021-07-16 DIAGNOSIS — I129 Hypertensive chronic kidney disease with stage 1 through stage 4 chronic kidney disease, or unspecified chronic kidney disease: Secondary | ICD-10-CM | POA: Diagnosis not present

## 2021-07-16 DIAGNOSIS — D649 Anemia, unspecified: Secondary | ICD-10-CM | POA: Diagnosis not present

## 2021-07-19 DIAGNOSIS — E559 Vitamin D deficiency, unspecified: Secondary | ICD-10-CM | POA: Diagnosis not present

## 2021-07-19 DIAGNOSIS — N1832 Chronic kidney disease, stage 3b: Secondary | ICD-10-CM | POA: Diagnosis not present

## 2021-07-19 DIAGNOSIS — M810 Age-related osteoporosis without current pathological fracture: Secondary | ICD-10-CM | POA: Diagnosis not present

## 2021-07-25 DIAGNOSIS — F4323 Adjustment disorder with mixed anxiety and depressed mood: Secondary | ICD-10-CM | POA: Diagnosis not present

## 2021-08-06 DIAGNOSIS — F4323 Adjustment disorder with mixed anxiety and depressed mood: Secondary | ICD-10-CM | POA: Diagnosis not present

## 2021-08-09 DIAGNOSIS — M810 Age-related osteoporosis without current pathological fracture: Secondary | ICD-10-CM | POA: Diagnosis not present

## 2021-08-20 DIAGNOSIS — F4323 Adjustment disorder with mixed anxiety and depressed mood: Secondary | ICD-10-CM | POA: Diagnosis not present

## 2021-08-23 DIAGNOSIS — L989 Disorder of the skin and subcutaneous tissue, unspecified: Secondary | ICD-10-CM | POA: Diagnosis not present

## 2021-08-23 DIAGNOSIS — I10 Essential (primary) hypertension: Secondary | ICD-10-CM | POA: Diagnosis not present

## 2021-08-23 DIAGNOSIS — M1991 Primary osteoarthritis, unspecified site: Secondary | ICD-10-CM | POA: Diagnosis not present

## 2021-08-23 DIAGNOSIS — D631 Anemia in chronic kidney disease: Secondary | ICD-10-CM | POA: Diagnosis not present

## 2021-09-06 DIAGNOSIS — I499 Cardiac arrhythmia, unspecified: Secondary | ICD-10-CM | POA: Diagnosis not present

## 2021-09-06 DIAGNOSIS — R600 Localized edema: Secondary | ICD-10-CM | POA: Diagnosis not present

## 2021-09-13 DIAGNOSIS — I499 Cardiac arrhythmia, unspecified: Secondary | ICD-10-CM | POA: Diagnosis not present

## 2021-09-27 DIAGNOSIS — M6281 Muscle weakness (generalized): Secondary | ICD-10-CM | POA: Diagnosis not present

## 2021-09-27 DIAGNOSIS — R600 Localized edema: Secondary | ICD-10-CM | POA: Diagnosis not present

## 2021-09-27 DIAGNOSIS — N1832 Chronic kidney disease, stage 3b: Secondary | ICD-10-CM | POA: Diagnosis not present

## 2021-10-20 DIAGNOSIS — M79605 Pain in left leg: Secondary | ICD-10-CM | POA: Diagnosis not present

## 2021-10-20 DIAGNOSIS — R600 Localized edema: Secondary | ICD-10-CM | POA: Diagnosis not present

## 2021-10-20 DIAGNOSIS — M79601 Pain in right arm: Secondary | ICD-10-CM | POA: Diagnosis not present

## 2021-10-20 DIAGNOSIS — M79604 Pain in right leg: Secondary | ICD-10-CM | POA: Diagnosis not present

## 2021-10-20 DIAGNOSIS — M79602 Pain in left arm: Secondary | ICD-10-CM | POA: Diagnosis not present

## 2021-11-01 DIAGNOSIS — D631 Anemia in chronic kidney disease: Secondary | ICD-10-CM | POA: Diagnosis not present

## 2021-11-01 DIAGNOSIS — R109 Unspecified abdominal pain: Secondary | ICD-10-CM | POA: Diagnosis not present

## 2021-11-01 DIAGNOSIS — R601 Generalized edema: Secondary | ICD-10-CM | POA: Diagnosis not present

## 2021-11-01 DIAGNOSIS — I499 Cardiac arrhythmia, unspecified: Secondary | ICD-10-CM | POA: Diagnosis not present

## 2021-11-01 DIAGNOSIS — I10 Essential (primary) hypertension: Secondary | ICD-10-CM | POA: Diagnosis not present

## 2021-11-01 NOTE — Progress Notes (Unsigned)
Cardiology Office Note:    Date:  11/02/2021   ID:  [name], DOB 1932-01-30, MRN [id number]  PCP:  Kathleen Jimenez., MD   [facility] Providers Cardiologist:  Aniketh Huberty    Referring MD: Armandina Gemma, MD   Chief Complaint  Patient presents with   Atrial Fibrillation    History of Present Illness:  11/02/21   Kathleen Jimenez is a 88 y.o. female with a hx of atrial fib, carotid artery disease,s/p L CEA ,  CKD, HTN  Kathleen Jimenez is an 88 year old female who is a mother of one of our local surgeons, Armandina Gemma.   Seen today with Kathleen Jimenez,  Has had profound leg edema  She has a history of a perforated duodenal ulcer in January, 2023.  She had a prolonged hospitalization.  Is in assisted living at [facility]  She sees Kathleen Reining, NP   Has stage IV CKD.   She had venous duplex scan of upper and lower extremities  Both were negative for DVT .   Has become weak.   Is basically wheelchair bound  Over 6 months she has declined. Needs assistance to transfer (fell several weeks ago )   Past Medical History:  Diagnosis Date   Anemia    Carotid stenosis, left    Chronic kidney disease    Hypertension     Past Surgical History:  Procedure Laterality Date   ENDARTERECTOMY Left 05/19/2017   Procedure: LEFT CAROTID ENDARTERECTOMY CAROTID;  Surgeon: [name], MD;  Location: [facility];  Service: Vascular;  Laterality: Left;   FEMUR IM NAIL Left 12/28/2016   Procedure: INTRAMEDULLARY (IM) NAIL INTERTROCHANTERIC LEFT;  Surgeon: Gaynelle Arabian, MD;  Location: [facility];  Service: Orthopedics;  Laterality: Left;   JOINT REPLACEMENT  approx 2012   R orif   LAPAROTOMY N/A 03/05/2021   Procedure: EXPLORATORY LAPAROTOMY and graham patch repair of perforated duodenal ulcer;  Surgeon: Kathleen Kussmaul, MD;  Location: [facility];  Service: General;  Laterality: N/A;   LUMBAR FUSION  approx. 2003   PATCH ANGIOPLASTY Left 05/19/2017   Procedure: PATCH ANGIOPLASTY OF LEFT CAROTID ARTERY USING  HEMASHIELD PLATIUM FINESSE PATCH;  Surgeon: [name], MD;  Location: [facility];  Service: Vascular;  Laterality: Left;   THYROID SURGERY  approx. 1960   nodule removed    Current Medications: Current Meds  Medication Sig   acetaminophen (TYLENOL) 500 MG tablet Take 2 tablets (1,000 mg total) by mouth every 8 (eight) hours as needed for moderate pain.   aspirin EC 81 MG tablet Take 81 mg by mouth daily. Swallow whole.   bumetanide (BUMEX) 1 MG tablet Take 1 mg by mouth 2 (two) times daily.   carvedilol (COREG) 6.25 MG tablet Take 6.25 mg by mouth 2 (two) times daily.   Cholecalciferol (VITAMIN D) 2000 units tablet Take 2,000 Units by mouth daily.   docusate sodium (COLACE) 100 MG capsule Take 100 mg by mouth daily.   Ferrous Sulfate (IRON) 325 (65 Fe) MG TABS Take 1 tablet by mouth 2 (two) times daily.   hydrALAZINE (APRESOLINE) 25 MG tablet Take 25 mg by mouth 2 (two) times daily.   Multiple Vitamin (MULTIVITAMIN WITH MINERALS) TABS tablet Take 1 tablet by mouth daily. Centrum   pantoprazole (PROTONIX) 40 MG tablet Take 1 tablet (40 mg total) by mouth 2 (two) times daily. (Patient taking differently: Take 40 mg by mouth daily.)   rosuvastatin (CRESTOR) 10 MG tablet Take 10 mg  by mouth daily.   sertraline (ZOLOFT) 50 MG tablet Take 50 mg by mouth daily.     Allergies:   Patient has no known allergies.   Social History   Socioeconomic History   Marital status: Widowed    Spouse name: Not on file   Number of children: Not on file   Years of education: Not on file   Highest education level: Not on file  Occupational History   Not on file  Tobacco Use   Smoking status: Never   Smokeless tobacco: Never  Vaping Use   Vaping Use: Never used  Substance and Sexual Activity   Alcohol use: No   Drug use: No   Sexual activity: Not on file  Other Topics Concern   Not on file  Social History Narrative   Not on file   Social Determinants of Health   Financial Resource Strain: Not  on file  Food Insecurity: Not on file  Transportation Needs: Not on file  Physical Activity: Not on file  Stress: Not on file  Social Connections: Not on file     Family History: The patient's family history includes Congestive Heart Failure in her mother; Heart disease in her mother; Stroke in her father.  ROS:   Please see the history of present illness.     All other systems reviewed and are negative.  EKGs/Labs/Other Studies Reviewed:    The following studies were reviewed today:   EKG:     Recent Labs: 03/24/2021: ALT 62 03/27/2021: BUN 33; Creatinine, Ser 1.47; Hemoglobin 7.2; Magnesium 2.1; Platelets 191; Potassium 3.1; Sodium 140  Recent Lipid Panel    Component Value Date/Time   TRIG 36 03/22/2021 0634     Risk Assessment/Calculations:           Physical Exam:    VS:  BP (!) 136/58   Pulse (!) 56   Ht 5\' 3"  (1.6 m)   Wt 107 lb 6.4 oz (48.7 kg)   SpO2 99%   BMI 19.03 kg/m     Wt Readings from Last 3 Encounters:  11/02/21 107 lb 6.4 oz (48.7 kg)  03/29/21 98 lb 5.2 oz (44.6 kg)  06/13/17 115 lb (52.2 kg)     GEN: Chronically ill-appearing elderly female.  In no acute distress HEENT: Normal NECK: No JVD; No carotid bruits LYMPHATICS: No lymphadenopathy CARDIAC: Regular rate S1-S2.  She has a 2/6 to 3/6 systolic ejection murmur radiating to the left axillary line consistent with mitral regurgitation. RESPIRATORY: Reduced breath sounds on the left side.  She has rales on her left side.  Findings are consistent with a pleural effusion with mild pulmonary edema. ABDOMEN: Soft, non-tender, non-distended MUSCULOSKELETAL: severe edema involving both legs, hands, arms.  The edema is very soft and is dependent.  She has weeping SKIN: Warm and dry NEUROLOGIC:  Alert and oriented x 3 PSYCHIATRIC:  Normal affect   ASSESSMENT:    1. Essential hypertension   2. Stage 5 chronic kidney disease not on chronic dialysis ([place])   3. Failure to thrive in adult   4.  Mitral valve insufficiency, unspecified etiology   5. Anemia, unspecified type    PLAN:    In order of problems listed above:   diffuse arm and leg swelling  [name]  presents with severe  diffuse swelling of her hands, arms, legs.  She also has a left pleural effusion on exam and some basilar rales.  She has clinical mitral regurgitation.  I suspect that  she does have some degree of congestive heart failure but her main issue is more of a combination of nephrotic syndrome, low albumin, anemia.  All of these are leading to severe and diffuse soft edema.  The edema does not appear to be primarily due to CHF.  She does not like to eat meat.  Her appetite is very poor.  She is anemic and her hemoglobin is gradually falling.  I do not think any of these issues are reversible.  I would like to get an echocardiogram to assess her LV function and mitral regurgitation.  Her primary medical doctor is giving her a trial of Bumex.  I will see her again in 3 months for a follow-up visit.  She is on Bumex 1 mg p.o. twice daily for 1 week.  She also has been started on carvedilol 6.25 mg twice a day.   2.  Hypertension: Her blood pressure seems to be fairly well controlled.         Medication Adjustments/Labs and Tests Ordered: Current medicines are reviewed at length with the patient today.  Concerns regarding medicines are outlined above.  Orders Placed This Encounter  Procedures   ECHOCARDIOGRAM COMPLETE   No orders of the defined types were placed in this encounter.   Patient Instructions  Medication Instructions:  Your physician recommends that you continue on your current medications as directed. Please refer to the Current Medication list given to you today.  *If you need a refill on your cardiac medications before your next appointment, please call your pharmacy*   Lab Work: NONE If you have labs (blood work) drawn today and your tests are completely normal, you will receive  your results only by: [place] (if you have MyChart) OR A paper copy in the mail If you have any lab test that is abnormal or we need to change your treatment, we will call you to review the results.   Testing/Procedures: ECHO Your physician has requested that you have an echocardiogram. Echocardiography is a painless test that uses sound waves to create images of your heart. It provides your doctor with information about the size and shape of your heart and how well your heart's chambers and valves are working. This procedure takes approximately one hour. There are no restrictions for this procedure.  Follow-Up: At [facility], you and your health needs are our priority.  As part of our continuing mission to provide you with exceptional heart care, we have created designated Provider Care Teams.  These Care Teams include your primary Cardiologist (physician) and Advanced Practice Providers (APPs -  Physician Assistants and Nurse Practitioners) who all work together to provide you with the care you need, when you need it.  Your next appointment:   3 month(s)  The format for your next appointment:   In Person  Provider:   Mertie Moores, MD}     Important Information About Sugar         Signed, Mertie Moores, MD  11/02/2021 1:17 PM    [place]

## 2021-11-02 ENCOUNTER — Encounter: Payer: Self-pay | Admitting: Cardiovascular Disease

## 2021-11-02 ENCOUNTER — Ambulatory Visit: Payer: Medicare Other | Admitting: Cardiovascular Disease

## 2021-11-02 VITALS — BP 136/58 | HR 56 | Ht 63.0 in | Wt 107.4 lb

## 2021-11-02 DIAGNOSIS — D649 Anemia, unspecified: Secondary | ICD-10-CM

## 2021-11-02 DIAGNOSIS — I340 Nonrheumatic mitral (valve) insufficiency: Secondary | ICD-10-CM | POA: Diagnosis not present

## 2021-11-02 DIAGNOSIS — N185 Chronic kidney disease, stage 5: Secondary | ICD-10-CM

## 2021-11-02 DIAGNOSIS — I10 Essential (primary) hypertension: Secondary | ICD-10-CM | POA: Diagnosis not present

## 2021-11-02 DIAGNOSIS — R627 Adult failure to thrive: Secondary | ICD-10-CM | POA: Diagnosis not present

## 2021-11-02 NOTE — Patient Instructions (Signed)
Medication Instructions:  Your physician recommends that you continue on your current medications as directed. Please refer to the Current Medication list given to you today.  *If you need a refill on your cardiac medications before your next appointment, please call your pharmacy*   Lab Work: NONE If you have labs (blood work) drawn today and your tests are completely normal, you will receive your results only by: MyChart Message (if you have MyChart) OR A paper copy in the mail If you have any lab test that is abnormal or we need to change your treatment, we will call you to review the results.   Testing/Procedures: ECHO Your physician has requested that you have an echocardiogram. Echocardiography is a painless test that uses sound waves to create images of your heart. It provides your doctor with information about the size and shape of your heart and how well your heart's chambers and valves are working. This procedure takes approximately one hour. There are no restrictions for this procedure.  Follow-Up: At [facility], you and your health needs are our priority.  As part of our continuing mission to provide you with exceptional heart care, we have created designated Provider Care Teams.  These Care Teams include your primary Cardiologist (physician) and Advanced Practice Providers (APPs -  Physician Assistants and Nurse Practitioners) who all work together to provide you with the care you need, when you need it.  Your next appointment:   3 month(s)  The format for your next appointment:   In Person  Provider:   Philip Nahser, MD      Important Information About Sugar       

## 2021-11-08 DIAGNOSIS — R600 Localized edema: Secondary | ICD-10-CM | POA: Diagnosis not present

## 2021-11-17 ENCOUNTER — Ambulatory Visit (HOSPITAL_COMMUNITY): Payer: Medicare Other

## 2021-11-19 DEATH — deceased

## 2022-02-02 ENCOUNTER — Ambulatory Visit: Payer: Medicare Other | Admitting: Cardiovascular Disease

## 2023-10-23 IMAGING — DX DG WRIST COMPLETE 3+V*R*
4 series · 4 of 4 positions shown · non-contrast
Comparison: None.

CLINICAL DATA: Right hand pain.

EXAM:
RIGHT WRIST - COMPLETE 3+ VIEW

[wrist ap (1 of 2)]
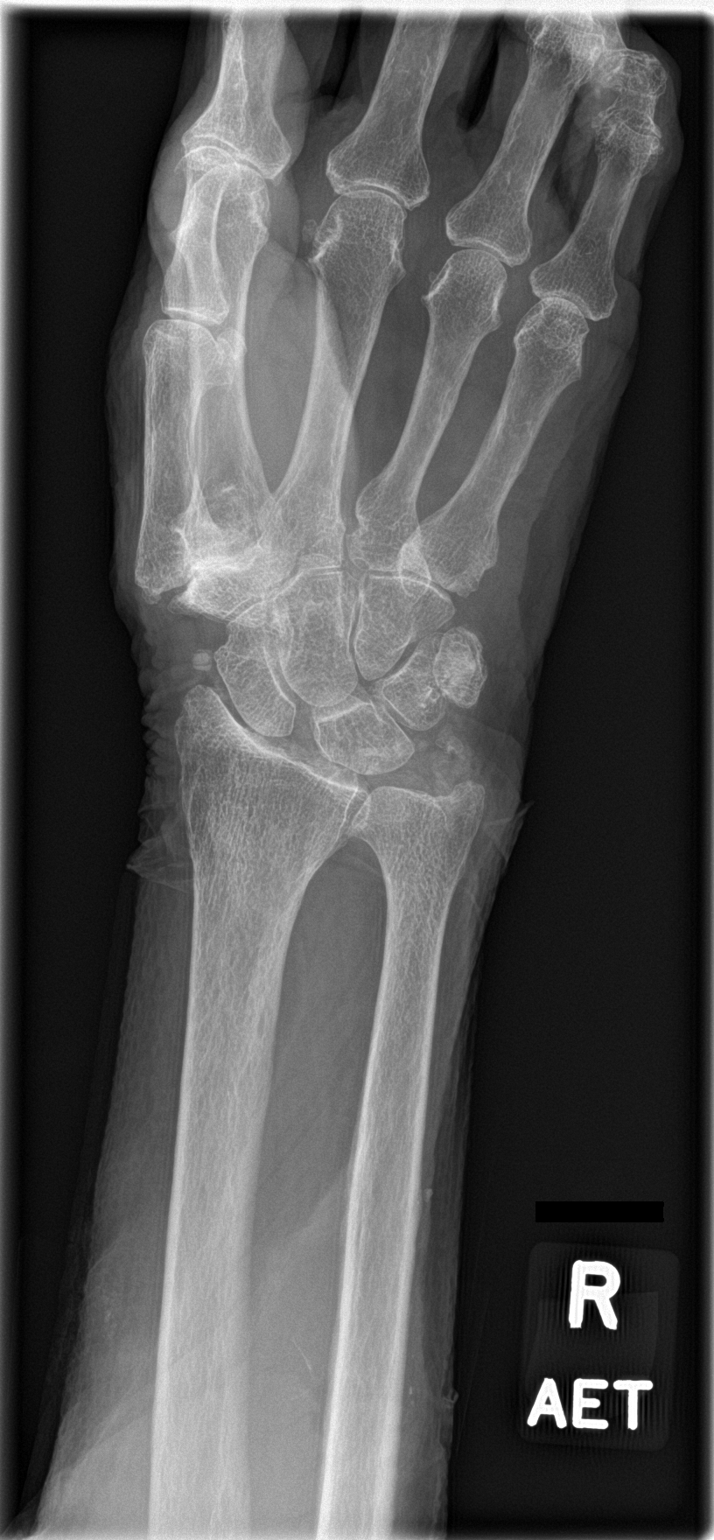

[wrist obl]
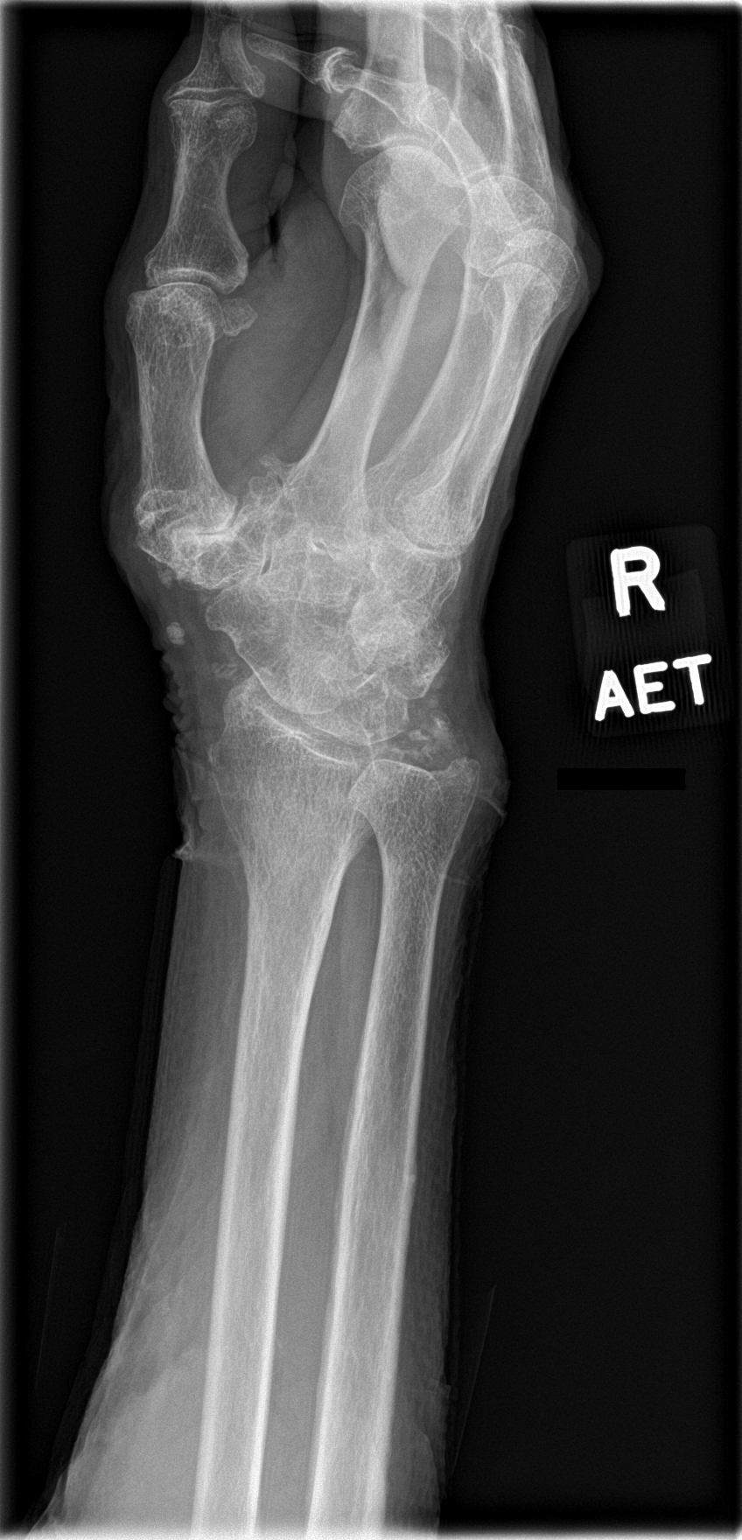

[wrist lat]
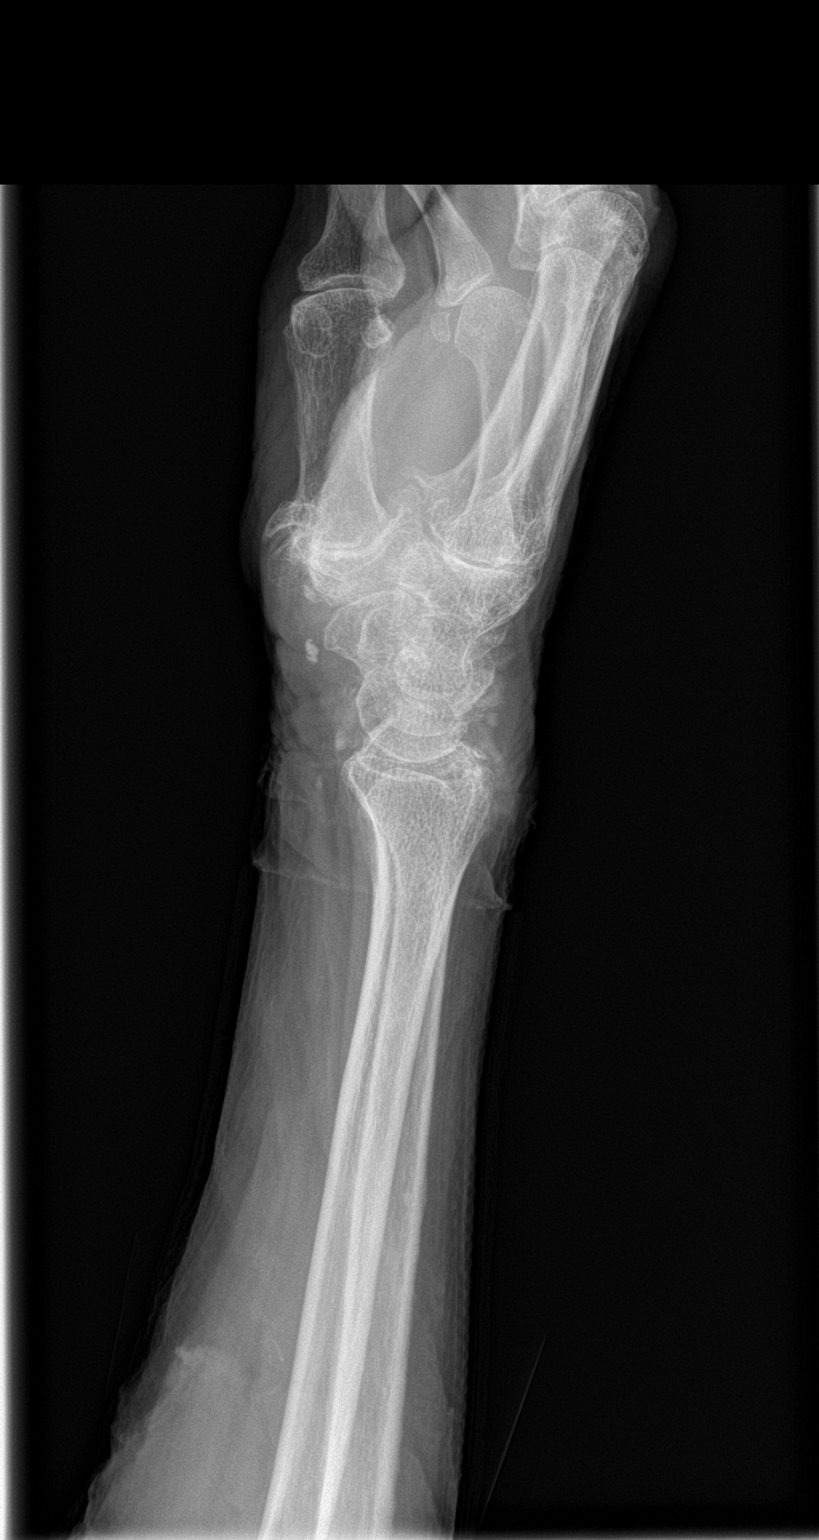

[wrist ap (2 of 2)]
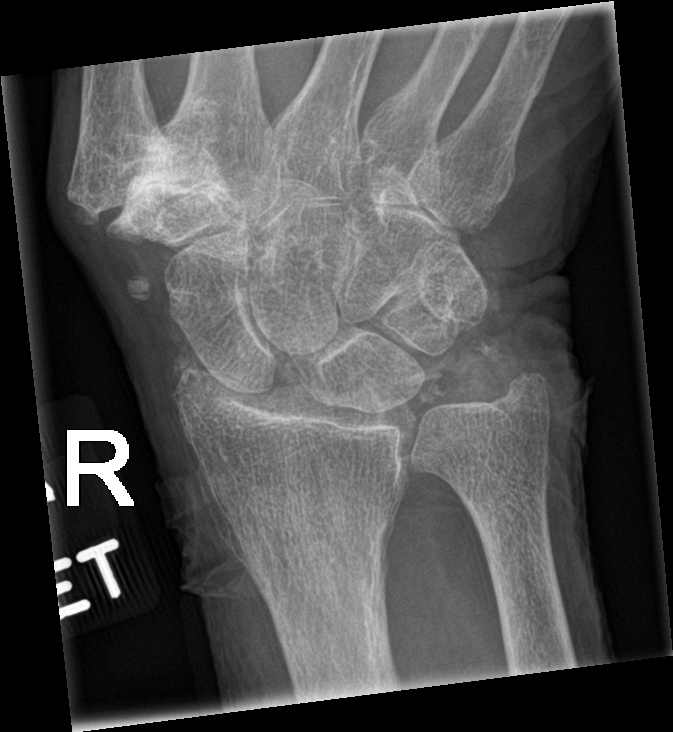

[4 of 4 positions shown; findings below may reference images not displayed]

FINDINGS: No fracture or bone lesion.

Widened scapholunate interval to 4 mm. Mild narrowing of the radial
scaphoid joint space. More significant narrowing with subchondral
sclerosis and osteophytes at the trapezium first metacarpal
articulation. Remaining joints normally spaced and aligned.

Skeletal structures are demineralized.
IMPRESSION: 1. No fracture or acute finding.
2. Widened scapholunate interval suggests chronic disruption of the
scapholunate ligament.
3. Arthropathic changes consistent with osteoarthritis predominantly
involving trapezium first metacarpal articulation milder involvement
the radial scaphoid joint.
